# Patient Record
Sex: Female | Born: 1950 | Race: White | Hispanic: No | Smoking: Never smoker
Health system: Southern US, Community
[De-identification: ages and names within clinical notes are randomized; demographics above are authoritative.]

## PROBLEM LIST (undated history)

## (undated) DIAGNOSIS — F32A Depression, unspecified: Secondary | ICD-10-CM

## (undated) DIAGNOSIS — Z8489 Family history of other specified conditions: Secondary | ICD-10-CM

## (undated) DIAGNOSIS — J189 Pneumonia, unspecified organism: Secondary | ICD-10-CM

## (undated) DIAGNOSIS — C801 Malignant (primary) neoplasm, unspecified: Secondary | ICD-10-CM

## (undated) DIAGNOSIS — I10 Essential (primary) hypertension: Secondary | ICD-10-CM

## (undated) DIAGNOSIS — S92819A Other fracture of unspecified foot, initial encounter for closed fracture: Secondary | ICD-10-CM

## (undated) DIAGNOSIS — I499 Cardiac arrhythmia, unspecified: Secondary | ICD-10-CM

## (undated) DIAGNOSIS — E785 Hyperlipidemia, unspecified: Secondary | ICD-10-CM

## (undated) DIAGNOSIS — M1990 Unspecified osteoarthritis, unspecified site: Secondary | ICD-10-CM

## (undated) DIAGNOSIS — I493 Ventricular premature depolarization: Secondary | ICD-10-CM

## (undated) DIAGNOSIS — I358 Other nonrheumatic aortic valve disorders: Secondary | ICD-10-CM

## (undated) DIAGNOSIS — S99921A Unspecified injury of right foot, initial encounter: Secondary | ICD-10-CM

## (undated) DIAGNOSIS — R9431 Abnormal electrocardiogram [ECG] [EKG]: Secondary | ICD-10-CM

## (undated) DIAGNOSIS — F329 Major depressive disorder, single episode, unspecified: Secondary | ICD-10-CM

## (undated) DIAGNOSIS — Z8249 Family history of ischemic heart disease and other diseases of the circulatory system: Secondary | ICD-10-CM

## (undated) HISTORY — DX: Family history of ischemic heart disease and other diseases of the circulatory system: Z82.49

## (undated) HISTORY — DX: Other nonrheumatic aortic valve disorders: I35.8

## (undated) HISTORY — DX: Unspecified osteoarthritis, unspecified site: M19.90

## (undated) HISTORY — PX: URETERAL STENT PLACEMENT: SHX822

## (undated) HISTORY — DX: Abnormal electrocardiogram (ECG) (EKG): R94.31

## (undated) HISTORY — PX: JOINT REPLACEMENT: SHX530

## (undated) HISTORY — DX: Essential (primary) hypertension: I10

## (undated) HISTORY — DX: Hyperlipidemia, unspecified: E78.5

---

## 2013-03-29 ENCOUNTER — Other Ambulatory Visit (HOSPITAL_COMMUNITY)
Admission: RE | Admit: 2013-03-29 | Discharge: 2013-03-29 | Disposition: A | Discharge status: Home / Self Care | Payer: BC Managed Care – PPO | Source: Ambulatory Visit | Attending: Family Medicine | Admitting: Family Medicine

## 2013-03-29 DIAGNOSIS — Z124 Encounter for screening for malignant neoplasm of cervix: Secondary | ICD-10-CM | POA: Insufficient documentation

## 2013-03-29 DIAGNOSIS — Z1151 Encounter for screening for human papillomavirus (HPV): Secondary | ICD-10-CM | POA: Insufficient documentation

## 2013-04-26 ENCOUNTER — Other Ambulatory Visit: Payer: Self-pay

## 2013-04-26 DIAGNOSIS — Z1231 Encounter for screening mammogram for malignant neoplasm of breast: Secondary | ICD-10-CM

## 2013-06-15 ENCOUNTER — Ambulatory Visit
Admission: RE | Admit: 2013-06-15 | Discharge: 2013-06-15 | Disposition: A | Discharge status: Home / Self Care | Payer: Self-pay | Source: Ambulatory Visit

## 2013-06-15 DIAGNOSIS — Z1231 Encounter for screening mammogram for malignant neoplasm of breast: Secondary | ICD-10-CM

## 2013-07-15 ENCOUNTER — Ambulatory Visit: Payer: BC Managed Care – PPO | Admitting: Internal Medicine

## 2013-07-20 ENCOUNTER — Ambulatory Visit (INDEPENDENT_AMBULATORY_CARE_PROVIDER_SITE_OTHER): Payer: BC Managed Care – PPO | Admitting: Cardiology

## 2013-07-20 ENCOUNTER — Encounter: Payer: Self-pay | Admitting: Cardiology

## 2013-07-20 VITALS — BP 130/90 | HR 85 | Ht 66.0 in | Wt 223.1 lb

## 2013-07-20 DIAGNOSIS — I4949 Other premature depolarization: Secondary | ICD-10-CM

## 2013-07-20 DIAGNOSIS — I493 Ventricular premature depolarization: Secondary | ICD-10-CM | POA: Insufficient documentation

## 2013-07-20 DIAGNOSIS — E669 Obesity, unspecified: Secondary | ICD-10-CM | POA: Insufficient documentation

## 2013-07-20 DIAGNOSIS — I10 Essential (primary) hypertension: Secondary | ICD-10-CM | POA: Insufficient documentation

## 2013-07-20 DIAGNOSIS — Z01810 Encounter for preprocedural cardiovascular examination: Secondary | ICD-10-CM | POA: Insufficient documentation

## 2013-07-20 DIAGNOSIS — E782 Mixed hyperlipidemia: Secondary | ICD-10-CM | POA: Insufficient documentation

## 2013-07-20 NOTE — Assessment & Plan Note (Signed)
Currently on statin. Monitored by PCP.

## 2013-07-20 NOTE — Progress Notes (Signed)
PATIENT: Alicia Schroeder MRN: [id number] DOB: 08-06-1950 PCP: Lilian Coma, MD  Clinic Note: Chief Complaint  Patient presents with  . Surgical Clearance    Hip replacement on 09/29/2013 by Dr Wynelle Link. Dr Stephanie Acre referred her for "little pre beats" during EKG. C/o occas chest tightness.    HPI: Alicia Schroeder is a 63 y.o. female with a PMH below who presents today for preoperative risk assessment for hip surgery. She has a long-standing history of hypertension and hyperlipidemia, but has no documented cardiac conditions. She is relatively significant family history of CAD in her father (initial onset of disease was in his 31s, he had carotid endarterectomy as well as 3 vessel CABG plus bowel replacement. He eventually developed ischemic cardiomyopathy).  Interval History: Alicia Schroeder presents today for preoperative cardiovascular evaluation. The main reason for referral is an abnormal ECG which demonstrates essentially normal rhythm with normal QRS complexes but frequent PVCs in almost a trigeminy pattern. 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 She denies any sensation of palpitations or regular heartbeat associated with it. She has become very much inactive since roughly November of last year when her pain in her hips became worse. To do much the way of any significant activity to know if she's had any discomfort in her chest with rest or exertion. She has noted rare episodes of tightness in her chest have not been associated any particular activity.  Otherwise from a cardiac standpoint she's been relatively asymptomatic with no resting or exertional dyspnea beyond the huffing & puffing with walking. No PND, orthopnea or edema. No palpitations, lightheadedness, dizziness, weakness or syncope/near syncope. No TIA/amaurosis fugax symptoms. No melena, hematochezia hematuria.   Past Medical History  Diagnosis Date  . Hyperlipidemia   . Hypertension   . Arthritis      right greater the left hip  . Abnormal  ECG   . Family history of premature coronary artery disease     Father with extensive coronary and carotid disease    Prior Cardiac Evaluation and Past Surgical History: History reviewed. No pertinent past surgical history.  No Known Allergies  Current Outpatient Prescriptions  Medication Sig Dispense Refill  . aspirin EC 81 MG tablet Take 81 mg by mouth daily.      Marland Kitchen atorvastatin (LIPITOR) 20 MG tablet Take 20 mg by mouth daily.      . calcium carbonate (OS-CAL) 600 MG TABS tablet Take 600 mg by mouth daily.      . celecoxib (CELEBREX) 200 MG capsule Take 200 mg by mouth daily.      . cetirizine (ZYRTEC) 10 MG tablet Take 10 mg by mouth daily.      . Cholecalciferol (VITAMIN D3) 2000 UNITS TABS Take 1 tablet by mouth daily.      . Coenzyme Q10 50 MG CAPS Take 1 capsule by mouth daily.      Marland Kitchen lisinopril (PRINIVIL,ZESTRIL) 20 MG tablet Take 20 mg by mouth daily.      . Melatonin (MELATONIN FAST MELTZ) 500 MCG TBDP Take 1 tablet by mouth at bedtime as needed.      . Naproxen Sodium (ALEVE PO) Take by mouth daily.      Marland Kitchen OVER THE COUNTER MEDICATION Astragulus Tincture twice daily in the winter.       No current facility-administered medications for this visit.    History   Social History Narrative   She is a divorced mother of 2. She's been divorced for 10 years. She does long with her dog. 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    She's never smoked. Does not drink alcohol.    she does daily exercises for her hips 7 nasal week for 20 minutes at a time. This is mostly stretching and. Because of her hip pain she is no longer able to walk like she used to.    Phone [phone]   Children: Jasa Bucek [place], [place] - [phone]); Drianna Stolar [place], [place] [phone] since about.     family history includes Arrhythmia in her maternal grandfather; Heart disease in her paternal grandmother; Heart disease (age of onset: 82) in her father; Heart  failure in her mother; Hyperlipidemia in her father; Hypertension in her father, mother, and paternal grandfather; Kidney failure in her father; Parkinson's disease in her mother and paternal grandfather; Stroke in her maternal grandmother; Thyroid disease in her mother; Transient ischemic attack in her maternal grandmother.  ROS: A comprehensive Review of Systems - Negative except Significant bilateral hip and back pain worse in the right side. Otherwise essentially negative. She feels tired and lazy because she is not be able to do much. Takes melatonin to help her sleep.  PHYSICAL EXAM BP 130/90  Pulse 85  Ht 5\' 6"  (1.676 m)  Wt 223 lb 1.6 oz (101.197 kg)  BMI 36.03 kg/m2 General appearance: alert, cooperative, appears stated age, no distress, moderately obese and Well-nourished and well-groomed. Pleasant mood and affect. Answers questions appropriately HEENT: [place]/AT, EOMI, MMM, anicteric sclera Neck: no adenopathy, no carotid bruit, no JVD, supple, symmetrical, trachea midline and thyroid not enlarged, symmetric, no tenderness/mass/nodules Lungs: clear to auscultation bilaterally, normal percussion bilaterally and Nonlabored, and good air movement Heart: RRR with frequent ectopy. Normal S1-S2. Soft systolic murmur heard at the right upper sternal border (1/6). No R./G. Nondisplaced PMI. Abdomen: soft, non-tender; bowel sounds normal; no masses,  no organomegaly and mild truncal obesity Extremities: extremities normal, atraumatic, no cyanosis or edema and no ulcers, gangrene or trophic changes Pulses: 2+ and symmetric Skin: Skin color, texture, turgor normal. No rashes or lesions Neurologic: Alert and oriented X 3, normal strength and tone. Normal symmetric reflexes. Normal coordination with antalgic gait  [id number] today: Yes Rate: 85 , Rhythm: NSR with frequent PVCs in almost trigeminy pattern. Otherwise normal ECG.  Recent Labs: None available  ASSESSMENT / PLAN: 63 year old woman  with right risk factors including hypertension, hyperlipidemia, obesity and family history of premature CAD. She has an abnormal ECG with monomorphic PVCs. She is here for cardiovascular risk assessment for upcoming knee surgery which has left her essentially incapacitated and unable to walk. Therefore we're unable to assess her exercise tolerance.  Preoperative cardiovascular examination Based on her risk factors, and abnormality noted on ECG this warrants further evaluation. When unable to tell if she has active ischemic symptoms based on her lack of activity. Plan:   LexiScan Myoview--patient unable to walk, need to assess for possible ischemic etiology for monomorphic PVCs.   PVC's (premature ventricular contractions) Family history and risk factors noted above, concern for possible ischemic etiology versus structural abnormality.  Plan:  2-D echocardiogram  LexiScan Myoview  HTN (hypertension) Well-controlled on current regimen.  Mixed hyperlipidemia Currently on statin. Monitored by PCP.  Obesity (BMI 30-39.9) Discussed importance of dietary modification. Hopefully when her hip is repaired she will be able to get back to walking and other exercises.   Orders Placed This Encounter  Procedures  . Myocardial Perfusion Imaging    Standing Status: Future  Number of Occurrences:      Standing Expiration Date: 07/20/2014    Order Specific Question:  Where should this test be performed    Answer:  [facility]    Order Specific Question:  Type of stress    Answer:  Lexiscan    Order Specific Question:  Patient weight in lbs    Answer:  223  . EKG 12-Lead  . 2D Echocardiogram without contrast    Pre -op clearance, pvc's, htn,    Standing Status: Future     Number of Occurrences:      Standing Expiration Date: 07/20/2014    Order Specific Question:  Type of Echo    Answer:  Complete    Order Specific Question:  Where should this test be performed    Answer:  [facility]    Order Specific Question:  Reason for exam-Echo    Answer:  Abnormal Heart Sounds Nec  785.3    Order Specific Question:  Reason for exam-Echo    Answer:  Other - See Comments Section   Followup: 1 months  Danella Philson W. Ellyn Hack, M.D., M.S. Interventional Cardiolgy [facility]

## 2013-07-20 NOTE — Assessment & Plan Note (Signed)
Well-controlled on current regimen. ?

## 2013-07-20 NOTE — Assessment & Plan Note (Signed)
Discussed importance of dietary modification. Hopefully when her hip is repaired she will be able to get back to walking and other exercises.

## 2013-07-20 NOTE — Assessment & Plan Note (Signed)
Based on her risk factors, and abnormality noted on ECG this warrants further evaluation. When unable to tell if she has active ischemic symptoms based on her lack of activity. Plan:   LexiScan Myoview--patient unable to walk, need to assess for possible ischemic etiology for monomorphic PVCs.

## 2013-07-20 NOTE — Assessment & Plan Note (Signed)
Family history and risk factors noted above, concern for possible ischemic etiology versus structural abnormality.  Plan:  2-D echocardiogram  LexiScan Myoview

## 2013-07-20 NOTE — Patient Instructions (Signed)
Your physician has requested that you have a lexiscan myoview. For further information please visit [email or web address]. Please follow instruction sheet, as given.  Your physician has requested that you have an echocardiogram. Echocardiography is a painless test that uses sound waves to create images of your heart. It provides your doctor with information about the size and shape of your heart and how well your heart's chambers and valves are working. This procedure takes approximately one hour. There are no restrictions for this procedure.  Your physician wants you to follow-up  After test are completed Dr Ellyn Hack.  You will receive a reminder letter in the mail two months in advance. If you don't receive a letter, please call our office to schedule the follow-up appointment.

## 2013-07-29 ENCOUNTER — Telehealth (HOSPITAL_COMMUNITY): Payer: Self-pay

## 2013-08-03 ENCOUNTER — Ambulatory Visit (HOSPITAL_COMMUNITY)
Admission: RE | Admit: 2013-08-03 | Discharge: 2013-08-03 | Disposition: A | Discharge status: Home / Self Care | Payer: BC Managed Care – PPO | Source: Ambulatory Visit | Attending: Cardiovascular Disease | Admitting: Cardiovascular Disease

## 2013-08-03 DIAGNOSIS — E782 Mixed hyperlipidemia: Secondary | ICD-10-CM | POA: Insufficient documentation

## 2013-08-03 DIAGNOSIS — Z01810 Encounter for preprocedural cardiovascular examination: Secondary | ICD-10-CM | POA: Insufficient documentation

## 2013-08-03 DIAGNOSIS — I10 Essential (primary) hypertension: Secondary | ICD-10-CM | POA: Insufficient documentation

## 2013-08-03 DIAGNOSIS — I4949 Other premature depolarization: Secondary | ICD-10-CM | POA: Insufficient documentation

## 2013-08-03 DIAGNOSIS — I493 Ventricular premature depolarization: Secondary | ICD-10-CM

## 2013-08-03 HISTORY — PX: OTHER SURGICAL HISTORY: SHX169

## 2013-08-03 MED ORDER — REGADENOSON 0.4 MG/5ML IV SOLN
0.4000 mg | Freq: Once | INTRAVENOUS | Status: AC
  Administered 2013-08-03: 0.4 mg via INTRAVENOUS

## 2013-08-03 MED ORDER — TECHNETIUM TC 99M SESTAMIBI GENERIC - CARDIOLITE
10.5000 | Freq: Once | INTRAVENOUS | Status: AC | PRN
  Administered 2013-08-03: 11 via INTRAVENOUS

## 2013-08-03 MED ORDER — TECHNETIUM TC 99M SESTAMIBI GENERIC - CARDIOLITE
29.9000 | Freq: Once | INTRAVENOUS | Status: AC | PRN
  Administered 2013-08-03: 29.9 via INTRAVENOUS

## 2013-08-03 NOTE — Procedures (Addendum)
[facility] [address] [place] [address] [phone]  Cardiology Nuclear Med Study  Alicia Schroeder is a 63 y.o. female     MRN : [id number]     DOB: [birth year]  Procedure Date: 08/03/2013  Nuclear Med Background Indication for Stress Test:  Evaluation for Ischemia, Surgical Clearance and Abnormal EKG History:  No prior cardiac or respiratory history reported by patient. Cardiac Risk Factors: Family History - CAD, Hypertension, Lipids and Obesity  Symptoms:  Palpitations   Nuclear Pre-Procedure Caffeine/Decaff Intake:  1:00am NPO After: 11am   IV Site: R Forearm  IV 0.9% NS with Angio Cath:  22g  Chest Size (in):  n/a IV Started by: Azucena Cecil, RN  Height: 5\' 6"  (1.676 m)  Cup Size: F  BMI:  Body mass index is 36.01 kg/(m^2). Weight:  223 lb (101.152 kg)   Tech Comments:  n/a    Nuclear Med Study 1 or 2 day study: 1 day  Stress Test Type:  Superior Provider:  Glenetta Hew, MD   Resting Radionuclide: Technetium 71m Sestamibi  Resting Radionuclide Dose: 10.5 mCi   Stress Radionuclide:  Technetium 32m Sestamibi  Stress Radionuclide Dose: 29.9 mCi           Stress Protocol Rest HR: 82 Stress HR: 112  Rest BP: 161/108 Stress BP: 149/83  Exercise Time (min): n/a METS: n/a   Predicted Max HR: 158 bpm % Max HR: 70.89 bpm Rate Pressure Product: 18032  Dose of Adenosine (mg):  n/a Dose of Lexiscan: 0.4 mg  Dose of Atropine (mg): n/a Dose of Dobutamine: n/a mcg/kg/min (at max HR)  Stress Test Technologist: Leane Para, CCT Nuclear Technologist: Imagene Riches, CNMT   Rest Procedure:  Myocardial perfusion imaging was performed at rest 45 minutes following the intravenous administration of Technetium 35m Sestamibi. Stress Procedure:  The patient received IV Lexiscan 0.4 mg over 15-seconds.  Technetium 26m Sestamibi injected at 30-seconds.  There were no significant changes with Lexiscan.   Quantitative spect images were obtained after a 45 minute delay.  Transient Ischemic Dilatation (Normal <1.22):  0.97 Lung/Heart Ratio (Normal <0.45):  0.24 QGS EDV:  73 ml QGS ESV:  21 ml LV Ejection Fraction: 71%     Rest ECG: NSR with PVCs  Stress ECG: No significant change from baseline ECG  QPS Raw Data Images:  Normal; no motion artifact; normal heart/lung ratio. Stress Images:  Normal homogeneous uptake in all areas of the myocardium. Rest Images:  Normal homogeneous uptake in all areas of the myocardium. Subtraction (SDS):  No evidence of ischemia.  Impression Exercise Capacity:  Lexiscan with no exercise. BP Response:  Normal blood pressure response. Clinical Symptoms:  No significant symptoms noted. ECG Impression:  No significant ST segment change suggestive of ischemia. Comparison with Prior Nuclear Study: No images to compare  Overall Impression:  Normal stress nuclear study.  LV Wall Motion:  NL LV Function; NL Wall Motion   Lorretta Harp, MD  08/03/2013 5:14 PM

## 2013-08-04 ENCOUNTER — Telehealth: Payer: Self-pay | Admitting: *Deleted

## 2013-08-04 NOTE — Progress Notes (Signed)
Quick Note:  Stress Test looked good!! No sign of significant Heart Artery Disease. Pump function is normal.  Good news!!. Will await the results of the echocardiogram, but expect this to be normal.  With these results, there'll be no reason not to proceed with planned surgery.  Leonie Man, MD  ______

## 2013-08-04 NOTE — Telephone Encounter (Signed)
Left message on voice mail to call back in regards to [facility] results

## 2013-08-04 NOTE — Telephone Encounter (Signed)
Spoke to patient. Result given . Verbalized understanding  

## 2013-08-04 NOTE — Telephone Encounter (Signed)
Message copied by Raiford Simmonds on Wed Aug 04, 2013 10:19 AM ------      Message from: Ellyn Hack, DAVID W      Created: Wed Aug 04, 2013  2:38 AM       Stress Test looked good!! No sign of significant Heart Artery Disease.  Pump function is normal.            Good news!!. Will await the results of the echocardiogram, but expect this to be normal.            With these results, there'll be no reason not to proceed with planned surgery.            Leonie Man, MD       ------

## 2013-08-05 ENCOUNTER — Ambulatory Visit (HOSPITAL_COMMUNITY)
Admission: RE | Admit: 2013-08-05 | Discharge: 2013-08-05 | Disposition: A | Discharge status: Home / Self Care | Payer: BC Managed Care – PPO | Source: Ambulatory Visit | Attending: Cardiovascular Disease | Admitting: Cardiovascular Disease

## 2013-08-05 DIAGNOSIS — E782 Mixed hyperlipidemia: Secondary | ICD-10-CM

## 2013-08-05 DIAGNOSIS — I10 Essential (primary) hypertension: Secondary | ICD-10-CM | POA: Insufficient documentation

## 2013-08-05 DIAGNOSIS — I517 Cardiomegaly: Secondary | ICD-10-CM

## 2013-08-05 DIAGNOSIS — I493 Ventricular premature depolarization: Secondary | ICD-10-CM

## 2013-08-05 DIAGNOSIS — I4949 Other premature depolarization: Secondary | ICD-10-CM | POA: Insufficient documentation

## 2013-08-05 HISTORY — PX: TRANSTHORACIC ECHOCARDIOGRAM: SHX275

## 2013-08-05 NOTE — Progress Notes (Signed)
2D Echo Performed 08/05/2013    Alicia Schroeder, RCS

## 2013-08-06 NOTE — Progress Notes (Signed)
Quick Note:  Echo results: Good news: Essentially normal echocardiogram and normal pump function and normal valve function.  EF: 60-65%. Abnormal relaxation.  No regional wall motion abnormalities -- No signs to suggest heart attack.. No findings to suggest etiology of abnormal heart exam findings.  Leonie Man, MD    ______

## 2013-08-09 ENCOUNTER — Telehealth: Payer: Self-pay | Admitting: *Deleted

## 2013-08-09 NOTE — Telephone Encounter (Signed)
Spoke to patient.  ECHO Result given . Verbalized understanding  

## 2013-08-09 NOTE — Telephone Encounter (Signed)
Message copied by Raiford Simmonds on Mon Aug 09, 2013  8:15 AM ------      Message from: Leonie Man      Created: Fri Aug 06, 2013 12:44 AM       Echo results:      Good news: Essentially normal echocardiogram and normal pump function and normal valve function.        EF: 60-65%.  Abnormal relaxation.            No regional wall motion abnormalities  -- No signs to suggest heart attack..      No findings to suggest etiology of abnormal heart exam findings.            Leonie Man, MD                   ------

## 2013-08-30 ENCOUNTER — Encounter: Payer: Self-pay | Admitting: Cardiology

## 2013-08-30 ENCOUNTER — Ambulatory Visit (INDEPENDENT_AMBULATORY_CARE_PROVIDER_SITE_OTHER): Payer: BC Managed Care – PPO | Admitting: Cardiology

## 2013-08-30 VITALS — BP 140/80 | HR 92 | Ht 65.75 in | Wt 224.4 lb

## 2013-08-30 DIAGNOSIS — Z01810 Encounter for preprocedural cardiovascular examination: Secondary | ICD-10-CM

## 2013-08-30 DIAGNOSIS — I4949 Other premature depolarization: Secondary | ICD-10-CM

## 2013-08-30 DIAGNOSIS — I493 Ventricular premature depolarization: Secondary | ICD-10-CM

## 2013-08-30 DIAGNOSIS — E782 Mixed hyperlipidemia: Secondary | ICD-10-CM

## 2013-08-30 DIAGNOSIS — E669 Obesity, unspecified: Secondary | ICD-10-CM

## 2013-08-30 MED ORDER — ACEBUTOLOL HCL 200 MG PO CAPS: 200.0000 mg | ORAL_CAPSULE | Freq: Two times a day (BID) | ORAL | Status: DC

## 2013-08-30 NOTE — Assessment & Plan Note (Signed)
On Statin. Monitored by PCP.

## 2013-08-30 NOTE — Assessment & Plan Note (Signed)
Based upon ST & Echo results:  Low Risk Patent for at least Intermediate Risk Surgery.  Continue current medications with the addition of low dose Acebutolol.

## 2013-08-30 NOTE — Assessment & Plan Note (Signed)
Encouraged dietary modification & increased exercise post-op.

## 2013-08-30 NOTE — Assessment & Plan Note (Signed)
Evaluated with Echo & Myoivew.  No gross abnormalities -- structural or ischemic.  Start Acebutolol 200 mg po BID.

## 2013-08-30 NOTE — Patient Instructions (Addendum)
Your physician has recommended you make the following change in your medication..  1. START acebutolol - take 1 capsule once daily for 6 days, then increase to 1 capsule twice daily.   Your physician wants you to follow-up in: 6 months. You will receive a reminder letter in the mail two months in advance. If you don't receive a letter, please call our office to schedule the follow-up appointment.

## 2013-08-30 NOTE — Progress Notes (Signed)
PATIENT: Alicia Schroeder MRN: [id number] DOB: 1951/03/10 PCP: Lilian Coma, MD  Clinic Note: Chief Complaint  Patient presents with  . Follow-up    result echo ,myoview, no chest pain , no sob , no edema, need CLEARANCE HIP SUERGERY -DR Wynelle Link   HPI: Alicia Schroeder is a 63 y.o. female with a PMH below who presents today for followup preoperative risk assessment for hip surgery. She has a long-standing history of hypertension and hyperlipidemia, but has no documented cardiac conditions. She does have a relatively significant family history of CAD in her father (initial onset of disease was in his 38s, he had carotid endarterectomy as well as 3 vessel CABG plus bowel replacement. He eventually developed ischemic cardiomyopathy). I saw her earlier last month for preoperative risk assessment based on her risk factors and an EKG showing essentially monomorphic PVCs in trigeminy. She has been evaluated now with an echocardiogram and Myoview with results noted below. Both results are "relatively normal".  Interval History: Oralee continues to be doing fine overall from a cardiac standpoint, albeit relatively inactive to evaluate. She denies any significant palpitations or irregular heartbeats; however during last visit, she did note rare episodes of tightness in her chest have not been associated any particular activity.  Otherwise from a cardiac standpoint she's been relatively asymptomatic with no resting or exertional dyspnea beyond the huffing & puffing with walking. No chest pain with rest or exertion No PND, orthopnea or edema. No palpitations, lightheadedness, dizziness, weakness or syncope/near syncope. No TIA/amaurosis fugax symptoms. No melena, hematochezia, hematuria.  Kneeling notable thing that happened since I last saw her was that she had an incident where fell down her front steps. She was struggling with her dog, that was trying to run out the front door to get into an  altercation. She was trying to fight the dog and her legs caught up in the leash.  She tripped and fell down steps, but did not suffer any significant injury.  Thankfully she actually fell on the opposite hip. No claudication  Past Medical History  Diagnosis Date  . Hyperlipidemia   . Hypertension   . Arthritis      right greater the left hip  . Abnormal ECG   . Family history of premature coronary artery disease     Father with extensive coronary and carotid disease    Prior Cardiac Evaluation and Past Surgical History: Past Surgical History  Procedure Laterality Date  . Transthoracic echocardiogram  08/05/2013    Normal LV size and function. EF 60-65%. No regional WMA; grade 1 diastolic dysfunction with mild LA dilation; no significant valvular lesions -- mild aortic valve calcification/sclerosis (could explain a soft systolic murmur)  . Nm lexiscan myoview ltd  08/03/2013    Normal study. No ischemia or infarction. Normal wall motion. EF 71%   No Known Allergies  MEDICATION REVIEWED IN EPIC -- no changes SOCIAL & FAMILY HISTORIES REVIEWED IN EPIC - No change.  ROS: A comprehensive Review of Systems - Negative except Significant bilateral hip and back pain worse in the right side & recent fall related bruising & aches/pains. Otherwise essentially negative. She feels tired and lazy because she is not be able to do much. Takes melatonin to help her sleep.  PHYSICAL EXAM BP 140/80  Pulse 92  Ht 5' 5.75" (1.67 m)  Wt 224 lb 6.4 oz (101.787 kg)  BMI 36.50 kg/m2 General appearance: alert, cooperative, appears stated age, no distress, moderately obese and Well-nourished and well-groomed.  Pleasant mood and affect. Answers questions appropriately HEENT: [place]/AT, EOMI, MMM, anicteric sclera Neck: Supple, no adenopathy, carotid bruit, or  JVD, Lungs:CTAB, normal percussion bilaterally and Nonlabored, and good air movement Heart: RRR with frequent ectopy. Normal S1-S2. Soft 1/6 SEM @ RUSB;  no other R/G. Nondisplaced PMI. Abdomen: soft,NT/ND/NABS; no masses,  no organomegaly and mild truncal obesity Extremities: No C/C/E; no rash or lesions.; Pulses: 2+ and symmetric Neurologic: Alert and oriented X 3, normal strength and tone.   LKT:[id number] today: No   Recent Labs: None available  ASSESSMENT / PLAN: 63 year old woman with right risk factors including hypertension, hyperlipidemia, obesity and family history of premature CAD. She has an abnormal ECG with monomorphic PVCs. She is here for cardiovascular risk assessment for upcoming surgery.  Cardiac Risk assessment included an Echocardiogram & Myoview that were relatively normal.   She does have HTN that is not quite adequately controlled & frequent PVCs on ECG.  I will start low dose BB to reduce PVC frequency & for cardiac protection per-operatively.  Using Acebutolol to avoid side effect of reduced energy (to avoid post-op fatigue exacerbation).  She is very concerned about medications & side effects.    PVC's (premature ventricular contractions) Evaluated with Echo & Myoivew.  No gross abnormalities -- structural or ischemic.  Start Acebutolol 200 mg po BID.  Obesity (BMI 30-39.9) Encouraged dietary modification & increased exercise post-op.  Mixed hyperlipidemia On Statin. Monitored by PCP.  Preoperative cardiovascular examination Based upon ST & Echo results:  Low Risk Patent for at least Intermediate Risk Surgery.  Continue current medications with the addition of low dose Acebutolol.   No orders of the defined types were placed in this encounter.   Followup: 6 months  Dariann Huckaba W. Ellyn Hack, M.D., M.S. Interventional Cardiolgy [facility]

## 2013-09-15 NOTE — Progress Notes (Signed)
Surgery on 09/29/13.  Preop on 09/22/13 at 100pm.  Need orders in EPIC.  Thank You.  

## 2013-09-17 ENCOUNTER — Encounter (HOSPITAL_COMMUNITY): Payer: Self-pay | Admitting: Pharmacy Technician

## 2013-09-17 NOTE — Progress Notes (Signed)
Surgery on 09/29/13.  Preop on 09/22/13 at 100pm.  Need orders in EPIC.  Thank You.

## 2013-09-18 ENCOUNTER — Other Ambulatory Visit: Payer: Self-pay | Admitting: Orthopedic Surgery

## 2013-09-21 ENCOUNTER — Other Ambulatory Visit: Payer: Self-pay | Admitting: Orthopedic Surgery

## 2013-09-21 NOTE — Patient Instructions (Signed)
Alicia Schroeder  09/21/2013   Your procedure is scheduled on:  09/29/2013 0830am-1015am  Report to [facility] at     0600 AM.  Call this number if you have problems the morning of surgery: [phone]   Remember:   Do not eat food or drink liquids after midnight.   Take these medicines the morning of surgery with A SIP OF WATER:    Do not wear jewelry, make-up or nail polish.  Do not wear lotions, powders, or perfumes.   Do not shave 48 hours prior to surgery.   Do not bring valuables to the hospital.  Contacts, dentures or bridgework may not be worn into surgery.  Leave suitcase in the car. After surgery it may be brought to your room.  For patients admitted to the hospital, checkout time is 11:00 AM the day of  discharge.   [facility] - Preparing for Surgery Before surgery, you can play an important role.  Because skin is not sterile, your skin needs to be as free of germs as possible.  You can reduce the number of germs on your skin by washing with CHG (chlorahexidine gluconate) soap before surgery.  CHG is an antiseptic cleaner which kills germs and bonds with the skin to continue killing germs even after washing. Please DO NOT use if you have an allergy to CHG or antibacterial soaps.  If your skin becomes reddened/irritated stop using the CHG and inform your nurse when you arrive at Short Stay. Do not shave (including legs and underarms) for at least 48 hours prior to the first CHG shower.  You may shave your face. Please follow these instructions carefully:  1.  Shower with CHG Soap the night before surgery and the  morning of Surgery.  2.  If you choose to wash your hair, wash your hair first as usual with your  normal  shampoo.  3.  After you shampoo, rinse your hair and body thoroughly to remove the  shampoo.                           4.  Use CHG as you would any other liquid soap.  You can apply chg directly  to the skin and wash                       Gently with a  scrungie or clean washcloth.  5.  Apply the CHG Soap to your body ONLY FROM THE NECK DOWN.   Do not use on open                           Wound or open sores. Avoid contact with eyes, ears mouth and genitals (private parts).                        Genitals (private parts) with your normal soap.             6.  Wash thoroughly, paying special attention to the area where your surgery  will be performed.  7.  Thoroughly rinse your body with warm water from the neck down.  8.  DO NOT shower/wash with your normal soap after using and rinsing off  the CHG Soap.                9.  Pat yourself dry with a clean  towel.            10.  Wear clean pajamas.            11.  Place clean sheets on your bed the night of your first shower and do not  sleep with pets. Day of Surgery : Do not apply any lotions/deodorants the morning of surgery.  Please wear clean clothes to the hospital/surgery center.  FAILURE TO FOLLOW THESE INSTRUCTIONS MAY RESULT IN THE CANCELLATION OF YOUR SURGERY PATIENT SIGNATURE_________________________________  NURSE SIGNATURE__________________________________  ________________________________________________________________________   Alicia Schroeder  An incentive spirometer is a tool that can help keep your lungs clear and active. This tool measures how well you are filling your lungs with each breath. Taking long deep breaths may help reverse or decrease the chance of developing breathing (pulmonary) problems (especially infection) following:  A long period of time when you are unable to move or be active. BEFORE THE PROCEDURE   If the spirometer includes an indicator to show your best effort, your nurse or respiratory therapist will set it to a desired goal.  If possible, sit up straight or lean slightly forward. Try not to slouch.  Hold the incentive spirometer in an upright position. INSTRUCTIONS FOR USE  1. Sit on the edge of your bed if possible, or sit up as far  as you can in bed or on a chair. 2. Hold the incentive spirometer in an upright position. 3. Breathe out normally. 4. Place the mouthpiece in your mouth and seal your lips tightly around it. 5. Breathe in slowly and as deeply as possible, raising the piston or the ball toward the top of the column. 6. Hold your breath for 3-5 seconds or for as long as possible. Allow the piston or ball to fall to the bottom of the column. 7. Remove the mouthpiece from your mouth and breathe out normally. 8. Rest for a few seconds and repeat Steps 1 through 7 at least 10 times every 1-2 hours when you are awake. Take your time and take a few normal breaths between deep breaths. 9. The spirometer may include an indicator to show your best effort. Use the indicator as a goal to work toward during each repetition. 10. After each set of 10 deep breaths, practice coughing to be sure your lungs are clear. If you have an incision (the cut made at the time of surgery), support your incision when coughing by placing a pillow or rolled up towels firmly against it. Once you are able to get out of bed, walk around indoors and cough well. You may stop using the incentive spirometer when instructed by your caregiver.  RISKS AND COMPLICATIONS  Take your time so you do not get dizzy or light-headed.  If you are in pain, you may need to take or ask for pain medication before doing incentive spirometry. It is harder to take a deep breath if you are having pain. AFTER USE  Rest and breathe slowly and easily.  It can be helpful to keep track of a log of your progress. Your caregiver can provide you with a simple table to help with this. If you are using the spirometer at home, follow these instructions: [place] IF:   You are having difficultly using the spirometer.  You have trouble using the spirometer as often as instructed.  Your pain medication is not giving enough relief while using the spirometer.  You  develop fever of 100.5 F (38.1 C) or  higher. SEEK IMMEDIATE MEDICAL CARE IF:   You cough up bloody sputum that had not been present before.  You develop fever of 102 F (38.9 C) or greater.  You develop worsening pain at or near the incision site. MAKE SURE YOU:   Understand these instructions.  Will watch your condition.  Will get help right away if you are not doing well or get worse. Document Released: 09/16/2006 Document Revised: 07/29/2011 Document Reviewed: 11/17/2006 ExitCare Patient Information 2014 ExitCare, [place].   ________________________________________________________________________  WHAT IS A BLOOD TRANSFUSION? Blood Transfusion Information  A transfusion is the replacement of blood or some of its parts. Blood is made up of multiple cells which provide different functions.  Red blood cells carry oxygen and are used for blood loss replacement.  White blood cells fight against infection.  Platelets control bleeding.  Plasma helps clot blood.  Other blood products are available for specialized needs, such as hemophilia or other clotting disorders. BEFORE THE TRANSFUSION  Who gives blood for transfusions?   Healthy volunteers who are fully evaluated to make sure their blood is safe. This is blood bank blood. Transfusion therapy is the safest it has ever been in the practice of medicine. Before blood is taken from a donor, a complete history is taken to make sure that person has no history of diseases nor engages in risky social behavior (examples are intravenous drug use or sexual activity with multiple partners). The donor's travel history is screened to minimize risk of transmitting infections, such as malaria. The donated blood is tested for signs of infectious diseases, such as HIV and hepatitis. The blood is then tested to be sure it is compatible with you in order to minimize the chance of a transfusion reaction. If you or a relative donates blood, this is  often done in anticipation of surgery and is not appropriate for emergency situations. It takes many days to process the donated blood. RISKS AND COMPLICATIONS Although transfusion therapy is very safe and saves many lives, the main dangers of transfusion include:   Getting an infectious disease.  Developing a transfusion reaction. This is an allergic reaction to something in the blood you were given. Every precaution is taken to prevent this. The decision to have a blood transfusion has been considered carefully by your caregiver before blood is given. Blood is not given unless the benefits outweigh the risks. AFTER THE TRANSFUSION  Right after receiving a blood transfusion, you will usually feel much better and more energetic. This is especially true if your red blood cells have gotten low (anemic). The transfusion raises the level of the red blood cells which carry oxygen, and this usually causes an energy increase.  The nurse administering the transfusion will monitor you carefully for complications. HOME CARE INSTRUCTIONS  No special instructions are needed after a transfusion. You may find your energy is better. Speak with your caregiver about any limitations on activity for underlying diseases you may have. SEEK MEDICAL CARE IF:   Your condition is not improving after your transfusion.  You develop redness or irritation at the intravenous (IV) site. SEEK IMMEDIATE MEDICAL CARE IF:  Any of the following symptoms occur over the next 12 hours:  Shaking chills.  You have a temperature by mouth above 102 F (38.9 C), not controlled by medicine.  Chest, back, or muscle pain.  People around you feel you are not acting correctly or are confused.  Shortness of breath or difficulty breathing.  Dizziness and fainting.  You get a rash or develop hives.  You have a decrease in urine output.  Your urine turns a dark color or changes to pink, red, or brown. Any of the following  symptoms occur over the next 10 days:  You have a temperature by mouth above 102 F (38.9 C), not controlled by medicine.  Shortness of breath.  Weakness after normal activity.  The white part of the eye turns yellow (jaundice).  You have a decrease in the amount of urine or are urinating less often.  Your urine turns a dark color or changes to pink, red, or brown. Document Released: 05/03/2000 Document Revised: 07/29/2011 Document Reviewed: 12/21/2007 ExitCare Patient Information 2014 ExitCare, [place].  _______________________________________________________________________   Please read over the following fact sheets that you were given: MRSA Information, coughing and deep breathing exercises, leg exercises

## 2013-09-22 ENCOUNTER — Ambulatory Visit (HOSPITAL_COMMUNITY)
Admission: RE | Admit: 2013-09-22 | Discharge: 2013-09-22 | Disposition: A | Discharge status: Home / Self Care | Payer: BC Managed Care – PPO | Source: Ambulatory Visit | Attending: Orthopedic Surgery | Admitting: Orthopedic Surgery

## 2013-09-22 ENCOUNTER — Encounter (HOSPITAL_COMMUNITY)
Admission: RE | Admit: 2013-09-22 | Discharge: 2013-09-22 | Disposition: A | Discharge status: Home / Self Care | Payer: BC Managed Care – PPO | Source: Ambulatory Visit | Attending: Orthopedic Surgery | Admitting: Orthopedic Surgery

## 2013-09-22 ENCOUNTER — Encounter (HOSPITAL_COMMUNITY): Payer: Self-pay

## 2013-09-22 DIAGNOSIS — M169 Osteoarthritis of hip, unspecified: Secondary | ICD-10-CM | POA: Insufficient documentation

## 2013-09-22 DIAGNOSIS — M1610 Unilateral primary osteoarthritis, unspecified hip: Secondary | ICD-10-CM | POA: Insufficient documentation

## 2013-09-22 DIAGNOSIS — Z01818 Encounter for other preprocedural examination: Secondary | ICD-10-CM | POA: Insufficient documentation

## 2013-09-22 DIAGNOSIS — I10 Essential (primary) hypertension: Secondary | ICD-10-CM | POA: Insufficient documentation

## 2013-09-22 DIAGNOSIS — Z01812 Encounter for preprocedural laboratory examination: Secondary | ICD-10-CM | POA: Insufficient documentation

## 2013-09-22 HISTORY — DX: Malignant (primary) neoplasm, unspecified: C80.1

## 2013-09-22 HISTORY — DX: Cardiac arrhythmia, unspecified: I49.9

## 2013-09-22 HISTORY — DX: Ventricular premature depolarization: I49.3

## 2013-09-22 HISTORY — DX: Family history of other specified conditions: Z84.89

## 2013-09-22 HISTORY — DX: Pneumonia, unspecified organism: J18.9

## 2013-09-22 LAB — URINALYSIS, ROUTINE W REFLEX MICROSCOPIC
Bilirubin Urine: NEGATIVE
GLUCOSE, UA: NEGATIVE mg/dL
HGB URINE DIPSTICK: NEGATIVE
Ketones, ur: NEGATIVE mg/dL
Nitrite: NEGATIVE
PROTEIN: NEGATIVE mg/dL
Specific Gravity, Urine: 1.006 (ref 1.005–1.030)
UROBILINOGEN UA: 0.2 mg/dL (ref 0.0–1.0)
pH: 5.5 (ref 5.0–8.0)

## 2013-09-22 LAB — COMPREHENSIVE METABOLIC PANEL
ALK PHOS: 99 U/L (ref 39–117)
ALT: 15 U/L (ref 0–35)
AST: 16 U/L (ref 0–37)
Albumin: 4.3 g/dL (ref 3.5–5.2)
BUN: 21 mg/dL (ref 6–23)
CO2: 28 meq/L (ref 19–32)
Calcium: 10.7 mg/dL — ABNORMAL HIGH (ref 8.4–10.5)
Chloride: 103 mEq/L (ref 96–112)
Creatinine, Ser: 0.56 mg/dL (ref 0.50–1.10)
Glucose, Bld: 98 mg/dL (ref 70–99)
Potassium: 4.9 mEq/L (ref 3.7–5.3)
SODIUM: 141 meq/L (ref 137–147)
Total Bilirubin: 0.4 mg/dL (ref 0.3–1.2)
Total Protein: 7.2 g/dL (ref 6.0–8.3)

## 2013-09-22 LAB — CBC
HEMATOCRIT: 36.7 % (ref 36.0–46.0)
Hemoglobin: 12.2 g/dL (ref 12.0–15.0)
MCH: 29.8 pg (ref 26.0–34.0)
MCHC: 33.2 g/dL (ref 30.0–36.0)
MCV: 89.5 fL (ref 78.0–100.0)
Platelets: 317 10*3/uL (ref 150–400)
RBC: 4.1 MIL/uL (ref 3.87–5.11)
RDW: 13.5 % (ref 11.5–15.5)
WBC: 7.4 10*3/uL (ref 4.0–10.5)

## 2013-09-22 LAB — SURGICAL PCR SCREEN
MRSA, PCR: INVALID — AB
Staphylococcus aureus: INVALID — AB

## 2013-09-22 LAB — PROTIME-INR
INR: 0.95 (ref 0.00–1.49)
PROTHROMBIN TIME: 12.5 s (ref 11.6–15.2)

## 2013-09-22 LAB — URINE MICROSCOPIC-ADD ON

## 2013-09-22 LAB — APTT: APTT: 37 s (ref 24–37)

## 2013-09-22 NOTE — Progress Notes (Signed)
ECHO- 07/31/13- EPIC  Stress 08/03/13 EPIC  LOV- Dr Ellyn Hack 08/2013 EPIC

## 2013-09-22 NOTE — Progress Notes (Signed)
Urinalysis and micro result faxed via EPIC to Dr Wynelle Link.

## 2013-09-25 LAB — MRSA CULTURE

## 2013-09-28 ENCOUNTER — Other Ambulatory Visit: Payer: Self-pay | Admitting: Orthopedic Surgery

## 2013-09-28 DIAGNOSIS — C4491 Basal cell carcinoma of skin, unspecified: Secondary | ICD-10-CM | POA: Insufficient documentation

## 2013-09-28 DIAGNOSIS — Z780 Asymptomatic menopausal state: Secondary | ICD-10-CM | POA: Insufficient documentation

## 2013-09-28 NOTE — H&P (Signed)
TOTAL HIP ADMISSION H&P  Patient is admitted for left total hip arthroplasty - anterior approach.  Subjective:  Chief Complaint: left hip pain  HPI: Alicia Schroeder, 63 y.o. female, has a history of pain and functional disability in the left hip(s) due to arthritis and patient has failed non-surgical conservative treatments for greater than 12 weeks to include NSAID's and/or analgesics, flexibility and strengthening excercises and activity modification.  Onset of symptoms was gradual starting several years ago with gradually worsening course since that time.The patient noted no past surgery on the left hip(s).  Patient currently rates pain in the left hip at moderate with activity. Patient has worsening of pain with activity and weight bearing and pain that interfers with activities of daily living. Patient has evidence of bone on bone arthritis with flattening of the femoral head by imaging studies. This condition presents safety issues increasing the risk of falls. This patient has had continued pain.  There is no current active infection.  Patient Active Problem List   Diagnosis Date Noted  . Menopause 09/28/2013  . Cancer of the skin, basal cell 09/28/2013  . Obesity (BMI 30-39.9) 07/20/2013  . Preoperative cardiovascular examination 07/20/2013  . PVC's (premature ventricular contractions) 07/20/2013  . HTN (hypertension) 07/20/2013  . Mixed hyperlipidemia 07/20/2013   Past Medical History  Diagnosis Date  . Hyperlipidemia   . Hypertension   . Arthritis      right greater the left hip  . Abnormal ECG   . Family history of premature coronary artery disease     Father with extensive coronary and carotid disease  . Family history of anesthesia complication     sisterin law disabled from anesthesia   . Premature ventricular contractions   . Dysrhythmia     pvc's   . Heart murmur     no evidence per patient   . Pneumonia     15 years ago approximately   . Cancer     basal cell  carcinoma removed- 24 years ago     Past Surgical History  Procedure Laterality Date  . Transthoracic echocardiogram  08/05/2013    Normal LV size and function. EF 60-65%. No regional WMA; grade 1 diastolic dysfunction with mild LA dilation; no significant valvular lesions -- mild aortic valve calcification/sclerosis (could explain a soft systolic murmur)  . Nm lexiscan myoview ltd  08/03/2013    Normal study. No ischemia or infarction. Normal wall motion. EF 71%    Medications: Cetrizine CoQ10 Vit D3 Triple Flex Lisinopril Atorvastatin Aspirin 81 mg Acebutolol Aleve Celebrex  No Known Allergies  History  Substance Use Topics  . Smoking status: Never Smoker   . Smokeless tobacco: Never Used  . Alcohol Use: Yes     Comment: occasional     Family History  Problem Relation Age of Onset  . Parkinson's disease Mother   . Thyroid disease Mother   . Hypertension Mother   . Heart failure Mother   . Hypertension Father   . Hyperlipidemia Father   . Heart disease Father 61    Triple bypass.   . Kidney failure Father   . Stroke Maternal Grandmother   . Transient ischemic attack Maternal Grandmother   . Arrhythmia Maternal Grandfather     Pacemaker  . Heart disease Paternal Grandmother   . Parkinson's disease Paternal Grandfather   . Hypertension Paternal Grandfather      Review of Systems  Constitutional: Negative.   HENT: Negative.   Respiratory: Negative.  Cardiovascular: Negative.   Gastrointestinal: Negative.   Genitourinary: Negative.   Musculoskeletal: Positive for joint pain.  Neurological: Negative.   Psychiatric/Behavioral: Negative.     Objective:  Physical Exam  Constitutional: She is oriented to person, place, and time. She appears well-developed and well-nourished. No distress.  HENT:  Head: Normocephalic and atraumatic.  Eyes: EOM are normal. Pupils are equal, round, and reactive to light.  Neck: Neck supple. Carotid bruit is not present.   Cardiovascular: Normal rate and regular rhythm.   No murmur heard. Respiratory: Breath sounds normal. She has no wheezes. She has no rales.  GI: Soft. Bowel sounds are normal. She exhibits distension (generalized mild).  Musculoskeletal:       Left hip: She exhibits decreased range of motion (Flex 95 degrees, Zero IR, 10 ER, 20 Abduction).  Neurological: She is alert and oriented to person, place, and time.    Vital signs in last 24 hours: BP 154/88 Resp 12  Labs:   Estimated body mass index is 37.28 kg/(m^2) as calculated from the following:   Height as of 09/22/13: 5\' 5"  (1.651 m).   Weight as of 09/22/13: 101.606 kg (224 lb).   Imaging Review Plain radiographs demonstrate severe degenerative joint disease of the left hip(s). The bone quality appears to be good for age and reported activity level.  Assessment/Plan:  End stage arthritis, left hip(s)  The patient history, physical examination, clinical judgement of the provider and imaging studies are consistent with end stage degenerative joint disease of the left hip(s) and total hip arthroplasty - anterior approach is deemed medically necessary. The treatment options including medical management, injection therapy, arthroscopy and arthroplasty were discussed at length. The risks and benefits of total hip arthroplasty were presented and reviewed. The risks due to aseptic loosening, infection, stiffness, dislocation/subluxation,  thromboembolic complications and other imponderables were discussed.  The patient acknowledged the explanation, agreed to proceed with the plan and consent was signed. Patient is being admitted for inpatient treatment for surgery, pain control, PT, OT, prophylactic antibiotics, VTE prophylaxis, progressive ambulation and ADL's and discharge planning.The patient is planning to be discharged to skilled nursing facility, [facility] or [facility] .  The patient will receive TXA.  PCP - Dr. Jonathon Jordan  Arlee Muslim, PA-C

## 2013-09-28 NOTE — Anesthesia Preprocedure Evaluation (Addendum)
Anesthesia Evaluation  Patient identified by MRN, date of birth, ID band Patient awake    Reviewed: Allergy & Precautions, H&P , NPO status , Patient's Chart, lab work & pertinent test results  Airway Mallampati: II TM Distance: >3 FB Neck ROM: Full    Dental no notable dental hx.    Pulmonary neg pulmonary ROS,  breath sounds clear to auscultation  Pulmonary exam normal       Cardiovascular hypertension, Rhythm:Regular Rate:Normal     Neuro/Psych negative neurological ROS  negative psych ROS   GI/Hepatic negative GI ROS, Neg liver ROS,   Endo/Other  negative endocrine ROS  Renal/GU negative Renal ROS  negative genitourinary   Musculoskeletal negative musculoskeletal ROS (+)   Abdominal   Peds negative pediatric ROS (+)  Hematology negative hematology ROS (+)   Anesthesia Other Findings   Reproductive/Obstetrics negative OB ROS                         Anesthesia Physical Anesthesia Plan  ASA: II  Anesthesia Plan: Spinal   Post-op Pain Management:    Induction: Intravenous  Airway Management Planned:   Additional Equipment:   Intra-op Plan:   Post-operative Plan:   Informed Consent: I have reviewed the patients History and Physical, chart, labs and discussed the procedure including the risks, benefits and alternatives for the proposed anesthesia with the patient or authorized representative who has indicated his/her understanding and acceptance.   Dental advisory given  Plan Discussed with: CRNA and Surgeon  Anesthesia Plan Comments:        Anesthesia Quick Evaluation

## 2013-09-29 ENCOUNTER — Inpatient Hospital Stay (HOSPITAL_COMMUNITY)
Admission: RE | Admit: 2013-09-29 | Discharge: 2013-10-01 | DRG: 470 | Disposition: A | Discharge status: Skilled Nursing Facility | Payer: BC Managed Care – PPO | Source: Ambulatory Visit | Attending: Orthopedic Surgery | Admitting: Orthopedic Surgery

## 2013-09-29 ENCOUNTER — Encounter (HOSPITAL_COMMUNITY): Payer: Self-pay | Admitting: *Deleted

## 2013-09-29 ENCOUNTER — Encounter (HOSPITAL_COMMUNITY)
Admission: RE | Disposition: A | Discharge status: Skilled Nursing Facility | Payer: Self-pay | Source: Ambulatory Visit | Attending: Orthopedic Surgery

## 2013-09-29 ENCOUNTER — Inpatient Hospital Stay (HOSPITAL_COMMUNITY): Payer: BC Managed Care – PPO | Admitting: Anesthesiology

## 2013-09-29 ENCOUNTER — Encounter (HOSPITAL_COMMUNITY): Payer: BC Managed Care – PPO | Admitting: Anesthesiology

## 2013-09-29 ENCOUNTER — Inpatient Hospital Stay (HOSPITAL_COMMUNITY): Payer: BC Managed Care – PPO

## 2013-09-29 DIAGNOSIS — Z823 Family history of stroke: Secondary | ICD-10-CM

## 2013-09-29 DIAGNOSIS — E669 Obesity, unspecified: Secondary | ICD-10-CM | POA: Diagnosis present

## 2013-09-29 DIAGNOSIS — D62 Acute posthemorrhagic anemia: Secondary | ICD-10-CM | POA: Diagnosis not present

## 2013-09-29 DIAGNOSIS — Z791 Long term (current) use of non-steroidal anti-inflammatories (NSAID): Secondary | ICD-10-CM

## 2013-09-29 DIAGNOSIS — M169 Osteoarthritis of hip, unspecified: Secondary | ICD-10-CM | POA: Diagnosis present

## 2013-09-29 DIAGNOSIS — E782 Mixed hyperlipidemia: Secondary | ICD-10-CM | POA: Diagnosis present

## 2013-09-29 DIAGNOSIS — M1610 Unilateral primary osteoarthritis, unspecified hip: Principal | ICD-10-CM | POA: Diagnosis present

## 2013-09-29 DIAGNOSIS — R011 Cardiac murmur, unspecified: Secondary | ICD-10-CM | POA: Diagnosis present

## 2013-09-29 DIAGNOSIS — Z7982 Long term (current) use of aspirin: Secondary | ICD-10-CM

## 2013-09-29 DIAGNOSIS — Z8249 Family history of ischemic heart disease and other diseases of the circulatory system: Secondary | ICD-10-CM

## 2013-09-29 DIAGNOSIS — Z79899 Other long term (current) drug therapy: Secondary | ICD-10-CM

## 2013-09-29 DIAGNOSIS — Z96642 Presence of left artificial hip joint: Secondary | ICD-10-CM

## 2013-09-29 DIAGNOSIS — Z6837 Body mass index (BMI) 37.0-37.9, adult: Secondary | ICD-10-CM

## 2013-09-29 DIAGNOSIS — Z780 Asymptomatic menopausal state: Secondary | ICD-10-CM

## 2013-09-29 DIAGNOSIS — I10 Essential (primary) hypertension: Secondary | ICD-10-CM | POA: Diagnosis present

## 2013-09-29 HISTORY — PX: TOTAL HIP ARTHROPLASTY: SHX124

## 2013-09-29 LAB — ABO/RH: ABO/RH(D): A POS

## 2013-09-29 LAB — TYPE AND SCREEN
ABO/RH(D): A POS
Antibody Screen: NEGATIVE

## 2013-09-29 SURGERY — ARTHROPLASTY, HIP, TOTAL, ANTERIOR APPROACH
Anesthesia: Spinal | Site: Hip | Laterality: Left

## 2013-09-29 MED ORDER — SODIUM CHLORIDE 0.9 % IJ SOLN
INTRAMUSCULAR | Status: AC
  Filled 2013-09-29: qty 50

## 2013-09-29 MED ORDER — LACTATED RINGERS IV SOLN: INTRAVENOUS | Status: DC

## 2013-09-29 MED ORDER — ONDANSETRON HCL 4 MG/2ML IJ SOLN
INTRAMUSCULAR | Status: AC
  Filled 2013-09-29: qty 2

## 2013-09-29 MED ORDER — CEFAZOLIN SODIUM-DEXTROSE 2-3 GM-% IV SOLR
2.0000 g | Freq: Four times a day (QID) | INTRAVENOUS | Status: AC
  Administered 2013-09-29 (×2): 2 g via INTRAVENOUS
  Filled 2013-09-29 (×2): qty 50

## 2013-09-29 MED ORDER — METOCLOPRAMIDE HCL 5 MG/ML IJ SOLN: 5.0000 mg | Freq: Three times a day (TID) | INTRAMUSCULAR | Status: DC | PRN

## 2013-09-29 MED ORDER — PHENYLEPHRINE HCL 10 MG/ML IJ SOLN
INTRAMUSCULAR | Status: DC | PRN
  Administered 2013-09-29 (×2): 80 ug via INTRAVENOUS

## 2013-09-29 MED ORDER — TRANEXAMIC ACID 100 MG/ML IV SOLN
1000.0000 mg | INTRAVENOUS | Status: AC
  Administered 2013-09-29: 1000 mg via INTRAVENOUS
  Filled 2013-09-29: qty 10

## 2013-09-29 MED ORDER — EPHEDRINE SULFATE 50 MG/ML IJ SOLN
INTRAMUSCULAR | Status: DC | PRN
  Administered 2013-09-29 (×2): 10 mg via INTRAVENOUS
  Administered 2013-09-29: 5 mg via INTRAVENOUS

## 2013-09-29 MED ORDER — ONDANSETRON HCL 4 MG/2ML IJ SOLN
INTRAMUSCULAR | Status: DC | PRN
  Administered 2013-09-29: 4 mg via INTRAVENOUS

## 2013-09-29 MED ORDER — DEXAMETHASONE 6 MG PO TABS
10.0000 mg | ORAL_TABLET | Freq: Every day | ORAL | Status: AC
  Administered 2013-09-30: 10 mg via ORAL
  Filled 2013-09-29: qty 1

## 2013-09-29 MED ORDER — KETAMINE HCL 10 MG/ML IJ SOLN
INTRAMUSCULAR | Status: DC | PRN
  Administered 2013-09-29 (×2): 20 mg via INTRAVENOUS

## 2013-09-29 MED ORDER — RIVAROXABAN 10 MG PO TABS
10.0000 mg | ORAL_TABLET | Freq: Every day | ORAL | Status: DC
  Administered 2013-09-30 – 2013-10-01 (×2): 10 mg via ORAL
  Filled 2013-09-29 (×3): qty 1

## 2013-09-29 MED ORDER — METOCLOPRAMIDE HCL 10 MG PO TABS: 5.0000 mg | ORAL_TABLET | Freq: Three times a day (TID) | ORAL | Status: DC | PRN

## 2013-09-29 MED ORDER — PROPOFOL 10 MG/ML IV BOLUS
INTRAVENOUS | Status: AC
  Filled 2013-09-29: qty 20

## 2013-09-29 MED ORDER — ROCURONIUM BROMIDE 100 MG/10ML IV SOLN
INTRAVENOUS | Status: AC
  Filled 2013-09-29: qty 1

## 2013-09-29 MED ORDER — POLYETHYLENE GLYCOL 3350 17 G PO PACK: 17.0000 g | PACK | Freq: Every day | ORAL | Status: DC | PRN

## 2013-09-29 MED ORDER — KETOROLAC TROMETHAMINE 15 MG/ML IJ SOLN: 7.5000 mg | Freq: Four times a day (QID) | INTRAMUSCULAR | Status: AC | PRN

## 2013-09-29 MED ORDER — MIDAZOLAM HCL 2 MG/2ML IJ SOLN
INTRAMUSCULAR | Status: AC
  Filled 2013-09-29: qty 2

## 2013-09-29 MED ORDER — ONDANSETRON HCL 4 MG PO TABS: 4.0000 mg | ORAL_TABLET | Freq: Four times a day (QID) | ORAL | Status: DC | PRN

## 2013-09-29 MED ORDER — ACETAMINOPHEN 650 MG RE SUPP: 650.0000 mg | Freq: Four times a day (QID) | RECTAL | Status: DC | PRN

## 2013-09-29 MED ORDER — LORATADINE 10 MG PO TABS
10.0000 mg | ORAL_TABLET | Freq: Every day | ORAL | Status: DC
  Administered 2013-09-29 – 2013-10-01 (×3): 10 mg via ORAL
  Filled 2013-09-29 (×3): qty 1

## 2013-09-29 MED ORDER — LIDOCAINE HCL (CARDIAC) 20 MG/ML IV SOLN
INTRAVENOUS | Status: AC
  Filled 2013-09-29: qty 5

## 2013-09-29 MED ORDER — MORPHINE SULFATE 2 MG/ML IJ SOLN
1.0000 mg | INTRAMUSCULAR | Status: DC | PRN
  Administered 2013-09-29: 1 mg via INTRAVENOUS
  Filled 2013-09-29: qty 1

## 2013-09-29 MED ORDER — SODIUM CHLORIDE 0.9 % IJ SOLN
INTRAMUSCULAR | Status: AC
  Filled 2013-09-29: qty 10

## 2013-09-29 MED ORDER — 0.9 % SODIUM CHLORIDE (POUR BTL) OPTIME
TOPICAL | Status: DC | PRN
  Administered 2013-09-29: 1000 mL

## 2013-09-29 MED ORDER — BUPIVACAINE HCL (PF) 0.25 % IJ SOLN
INTRAMUSCULAR | Status: DC | PRN
  Administered 2013-09-29: 20 mL

## 2013-09-29 MED ORDER — PHENOL 1.4 % MT LIQD
1.0000 | OROMUCOSAL | Status: DC | PRN
  Filled 2013-09-29: qty 177

## 2013-09-29 MED ORDER — SODIUM CHLORIDE 0.9 % IJ SOLN
INTRAMUSCULAR | Status: DC | PRN
  Administered 2013-09-29: 30 mL

## 2013-09-29 MED ORDER — ATORVASTATIN CALCIUM 20 MG PO TABS
20.0000 mg | ORAL_TABLET | Freq: Every day | ORAL | Status: DC
  Administered 2013-09-29 – 2013-09-30 (×2): 20 mg via ORAL
  Filled 2013-09-29 (×3): qty 1

## 2013-09-29 MED ORDER — DIPHENHYDRAMINE HCL 12.5 MG/5ML PO ELIX: 12.5000 mg | ORAL_SOLUTION | ORAL | Status: DC | PRN

## 2013-09-29 MED ORDER — OXYCODONE HCL 5 MG PO TABS
5.0000 mg | ORAL_TABLET | ORAL | Status: DC | PRN
  Administered 2013-09-29 – 2013-10-01 (×8): 5 mg via ORAL
  Administered 2013-10-01: 10 mg via ORAL
  Filled 2013-09-29 (×5): qty 1
  Filled 2013-09-29: qty 2
  Filled 2013-09-29 (×3): qty 1

## 2013-09-29 MED ORDER — ONDANSETRON HCL 4 MG/2ML IJ SOLN: 4.0000 mg | Freq: Four times a day (QID) | INTRAMUSCULAR | Status: DC | PRN

## 2013-09-29 MED ORDER — FENTANYL CITRATE 0.05 MG/ML IJ SOLN
INTRAMUSCULAR | Status: AC
  Filled 2013-09-29: qty 2

## 2013-09-29 MED ORDER — TRAMADOL HCL 50 MG PO TABS: 50.0000 mg | ORAL_TABLET | Freq: Four times a day (QID) | ORAL | Status: DC | PRN

## 2013-09-29 MED ORDER — ACEBUTOLOL HCL 200 MG PO CAPS
200.0000 mg | ORAL_CAPSULE | Freq: Two times a day (BID) | ORAL | Status: DC
  Administered 2013-09-29 – 2013-10-01 (×4): 200 mg via ORAL
  Filled 2013-09-29 (×5): qty 1

## 2013-09-29 MED ORDER — ATROPINE SULFATE 0.4 MG/ML IJ SOLN
INTRAMUSCULAR | Status: AC
  Filled 2013-09-29: qty 1

## 2013-09-29 MED ORDER — EPHEDRINE SULFATE 50 MG/ML IJ SOLN
INTRAMUSCULAR | Status: AC
  Filled 2013-09-29: qty 1

## 2013-09-29 MED ORDER — ACETAMINOPHEN 10 MG/ML IV SOLN
1000.0000 mg | Freq: Once | INTRAVENOUS | Status: AC
  Administered 2013-09-29: 1000 mg via INTRAVENOUS
  Filled 2013-09-29: qty 100

## 2013-09-29 MED ORDER — FENTANYL CITRATE 0.05 MG/ML IJ SOLN
INTRAMUSCULAR | Status: DC | PRN
  Administered 2013-09-29: 100 ug via INTRAVENOUS

## 2013-09-29 MED ORDER — PHENYLEPHRINE 40 MCG/ML (10ML) SYRINGE FOR IV PUSH (FOR BLOOD PRESSURE SUPPORT)
PREFILLED_SYRINGE | INTRAVENOUS | Status: AC
  Filled 2013-09-29: qty 10

## 2013-09-29 MED ORDER — CEFAZOLIN SODIUM-DEXTROSE 2-3 GM-% IV SOLR
2.0000 g | INTRAVENOUS | Status: AC
  Administered 2013-09-29: 2 g via INTRAVENOUS

## 2013-09-29 MED ORDER — LACTATED RINGERS IV SOLN
INTRAVENOUS | Status: DC | PRN
  Administered 2013-09-29: 08:00:00 via INTRAVENOUS

## 2013-09-29 MED ORDER — METHOCARBAMOL 1000 MG/10ML IJ SOLN
500.0000 mg | Freq: Four times a day (QID) | INTRAVENOUS | Status: DC | PRN
  Administered 2013-09-29: 500 mg via INTRAVENOUS
  Filled 2013-09-29: qty 5

## 2013-09-29 MED ORDER — PHENYLEPHRINE HCL 10 MG/ML IJ SOLN
INTRAMUSCULAR | Status: AC
  Filled 2013-09-29: qty 1

## 2013-09-29 MED ORDER — SODIUM CHLORIDE 0.9 % IV SOLN: INTRAVENOUS | Status: DC

## 2013-09-29 MED ORDER — ACETAMINOPHEN 500 MG PO TABS
1000.0000 mg | ORAL_TABLET | Freq: Four times a day (QID) | ORAL | Status: AC
  Administered 2013-09-29 – 2013-09-30 (×4): 1000 mg via ORAL
  Filled 2013-09-29 (×4): qty 2

## 2013-09-29 MED ORDER — HYDROMORPHONE HCL PF 1 MG/ML IJ SOLN: 0.2500 mg | INTRAMUSCULAR | Status: DC | PRN

## 2013-09-29 MED ORDER — FLEET ENEMA 7-19 GM/118ML RE ENEM: 1.0000 | ENEMA | Freq: Once | RECTAL | Status: AC | PRN

## 2013-09-29 MED ORDER — DEXAMETHASONE SODIUM PHOSPHATE 10 MG/ML IJ SOLN
10.0000 mg | Freq: Once | INTRAMUSCULAR | Status: AC
  Administered 2013-09-29: 10 mg via INTRAVENOUS

## 2013-09-29 MED ORDER — MENTHOL 3 MG MT LOZG
1.0000 | LOZENGE | OROMUCOSAL | Status: DC | PRN
  Filled 2013-09-29: qty 9

## 2013-09-29 MED ORDER — DEXTROSE-NACL 5-0.45 % IV SOLN
INTRAVENOUS | Status: DC
  Administered 2013-09-29: 13:00:00 via INTRAVENOUS

## 2013-09-29 MED ORDER — ACETAMINOPHEN 325 MG PO TABS: 650.0000 mg | ORAL_TABLET | Freq: Four times a day (QID) | ORAL | Status: DC | PRN

## 2013-09-29 MED ORDER — SODIUM CHLORIDE 0.9 % IV SOLN
10.0000 mg | INTRAVENOUS | Status: DC | PRN
  Administered 2013-09-29: 50 ug/min via INTRAVENOUS

## 2013-09-29 MED ORDER — PROPOFOL INFUSION 10 MG/ML OPTIME
INTRAVENOUS | Status: DC | PRN
  Administered 2013-09-29: 100 ug/kg/min via INTRAVENOUS

## 2013-09-29 MED ORDER — BISACODYL 10 MG RE SUPP: 10.0000 mg | Freq: Every day | RECTAL | Status: DC | PRN

## 2013-09-29 MED ORDER — POLYVINYL ALCOHOL 1.4 % OP SOLN
1.0000 [drp] | OPHTHALMIC | Status: DC | PRN
  Filled 2013-09-29: qty 15

## 2013-09-29 MED ORDER — CHLORHEXIDINE GLUCONATE 4 % EX LIQD: 60.0000 mL | Freq: Once | CUTANEOUS | Status: DC

## 2013-09-29 MED ORDER — DEXAMETHASONE SODIUM PHOSPHATE 10 MG/ML IJ SOLN
10.0000 mg | Freq: Every day | INTRAMUSCULAR | Status: AC
  Filled 2013-09-29: qty 1

## 2013-09-29 MED ORDER — PROMETHAZINE HCL 25 MG/ML IJ SOLN: 6.2500 mg | INTRAMUSCULAR | Status: DC | PRN

## 2013-09-29 MED ORDER — KETAMINE HCL 10 MG/ML IJ SOLN
INTRAMUSCULAR | Status: AC
Start: 2013-09-29 — End: 2013-09-29
  Filled 2013-09-29: qty 1

## 2013-09-29 MED ORDER — DOCUSATE SODIUM 100 MG PO CAPS
100.0000 mg | ORAL_CAPSULE | Freq: Two times a day (BID) | ORAL | Status: DC
  Administered 2013-09-29 – 2013-10-01 (×4): 100 mg via ORAL

## 2013-09-29 MED ORDER — METHOCARBAMOL 500 MG PO TABS
500.0000 mg | ORAL_TABLET | Freq: Four times a day (QID) | ORAL | Status: DC | PRN
  Administered 2013-09-29 – 2013-10-01 (×6): 500 mg via ORAL
  Filled 2013-09-29 (×6): qty 1

## 2013-09-29 MED ORDER — BUPIVACAINE LIPOSOME 1.3 % IJ SUSP
20.0000 mL | Freq: Once | INTRAMUSCULAR | Status: AC
  Administered 2013-09-29: 20 mL
  Filled 2013-09-29: qty 20

## 2013-09-29 MED ORDER — CEFAZOLIN SODIUM-DEXTROSE 2-3 GM-% IV SOLR
INTRAVENOUS | Status: AC
  Filled 2013-09-29: qty 50

## 2013-09-29 MED ORDER — MIDAZOLAM HCL 5 MG/5ML IJ SOLN
INTRAMUSCULAR | Status: DC | PRN
  Administered 2013-09-29: 2 mg via INTRAVENOUS

## 2013-09-29 MED ORDER — BUPIVACAINE IN DEXTROSE 0.75-8.25 % IT SOLN
INTRATHECAL | Status: DC | PRN
  Administered 2013-09-29: 2 mL via INTRATHECAL

## 2013-09-29 MED ORDER — BUPIVACAINE HCL (PF) 0.25 % IJ SOLN
INTRAMUSCULAR | Status: AC
  Filled 2013-09-29: qty 30

## 2013-09-29 MED ORDER — LACTATED RINGERS IV SOLN
INTRAVENOUS | Status: DC | PRN
  Administered 2013-09-29 (×2): via INTRAVENOUS

## 2013-09-29 SURGICAL SUPPLY — 45 items
BAG ZIPLOCK 12X15 (MISCELLANEOUS) IMPLANT
BLADE SAW SGTL 18X1.27X75 (BLADE) ×2 IMPLANT
CAPT HIP PF COP ×2 IMPLANT
COVER PERINEAL POST (MISCELLANEOUS) ×2 IMPLANT
DECANTER SPIKE VIAL GLASS SM (MISCELLANEOUS) ×2 IMPLANT
DRAPE C-ARM 42X120 X-RAY (DRAPES) ×2 IMPLANT
DRAPE STERI IOBAN 125X83 (DRAPES) ×2 IMPLANT
DRAPE U-SHAPE 47X51 STRL (DRAPES) ×6 IMPLANT
DRSG ADAPTIC 3X8 NADH LF (GAUZE/BANDAGES/DRESSINGS) ×2 IMPLANT
DRSG MEPILEX BORDER 4X4 (GAUZE/BANDAGES/DRESSINGS) ×2 IMPLANT
DRSG MEPILEX BORDER 4X8 (GAUZE/BANDAGES/DRESSINGS) ×2 IMPLANT
DURAPREP 26ML APPLICATOR (WOUND CARE) ×2 IMPLANT
ELECT BLADE 6.5 EXT (BLADE) ×2 IMPLANT
ELECT REM PT RETURN 9FT ADLT (ELECTROSURGICAL) ×2
ELECTRODE REM PT RTRN 9FT ADLT (ELECTROSURGICAL) ×1 IMPLANT
EVACUATOR 1/8 PVC DRAIN (DRAIN) ×2 IMPLANT
FACESHIELD WRAPAROUND (MASK) ×6 IMPLANT
GLOVE BIO SURGEON STRL SZ7.5 (GLOVE) ×2 IMPLANT
GLOVE BIO SURGEON STRL SZ8 (GLOVE) ×4 IMPLANT
GLOVE BIOGEL PI IND STRL 6.5 (GLOVE) ×1 IMPLANT
GLOVE BIOGEL PI IND STRL 8 (GLOVE) ×2 IMPLANT
GLOVE BIOGEL PI IND STRL 8.5 (GLOVE) ×1 IMPLANT
GLOVE BIOGEL PI INDICATOR 6.5 (GLOVE) ×1
GLOVE BIOGEL PI INDICATOR 8 (GLOVE) ×2
GLOVE BIOGEL PI INDICATOR 8.5 (GLOVE) ×1
GLOVE SURG SS PI 6.5 STRL IVOR (GLOVE) ×2 IMPLANT
GOWN STRL REIN XL XLG (GOWN DISPOSABLE) ×2 IMPLANT
GOWN STRL REUS W/TWL LRG LVL3 (GOWN DISPOSABLE) ×2 IMPLANT
GOWN STRL REUS W/TWL XL LVL3 (GOWN DISPOSABLE) ×4 IMPLANT
KIT BASIN OR (CUSTOM PROCEDURE TRAY) ×2 IMPLANT
NDL SAFETY ECLIPSE 18X1.5 (NEEDLE) ×2 IMPLANT
NEEDLE HYPO 18GX1.5 SHARP (NEEDLE) ×2
PACK TOTAL JOINT (CUSTOM PROCEDURE TRAY) ×2 IMPLANT
SPONGE GAUZE 4X4 12PLY (GAUZE/BANDAGES/DRESSINGS) IMPLANT
STRIP CLOSURE SKIN 1/2X4 (GAUZE/BANDAGES/DRESSINGS) ×2 IMPLANT
SUCTION FRAZIER 12FR DISP (SUCTIONS) IMPLANT
SUT ETHIBOND NAB CT1 #1 30IN (SUTURE) ×2 IMPLANT
SUT MNCRL AB 4-0 PS2 18 (SUTURE) ×2 IMPLANT
SUT VIC AB 2-0 CT1 27 (SUTURE) ×2
SUT VIC AB 2-0 CT1 TAPERPNT 27 (SUTURE) ×2 IMPLANT
SUT VLOC 180 0 24IN GS25 (SUTURE) ×2 IMPLANT
SYR 20CC LL (SYRINGE) ×2 IMPLANT
SYR 50ML LL SCALE MARK (SYRINGE) ×2 IMPLANT
TOWEL OR 17X26 10 PK STRL BLUE (TOWEL DISPOSABLE) ×2 IMPLANT
TRAY FOLEY CATH 14FRSI W/METER (CATHETERS) ×2 IMPLANT

## 2013-09-29 NOTE — Anesthesia Postprocedure Evaluation (Signed)
  Anesthesia Post-op Note  Patient: Alicia Schroeder  Procedure(s) Performed: Procedure(s) (LRB): LEFT TOTAL HIP ARTHROPLASTY ANTERIOR APPROACH (Left)  Patient Location: PACU  Anesthesia Type: Spinal  Level of Consciousness: awake and alert   Airway and Oxygen Therapy: Patient Spontanous Breathing  Post-op Pain: mild  Post-op Assessment: Post-op Vital signs reviewed, Patient's Cardiovascular Status Stable, Respiratory Function Stable, Patent Airway and No signs of Nausea or vomiting  Last Vitals:  Filed Vitals:   09/29/13 1115  BP: 121/62  Pulse: 54  Temp: 36.3 C  Resp: 12    Post-op Vital Signs: stable   Complications: No apparent anesthesia complications

## 2013-09-29 NOTE — Anesthesia Procedure Notes (Signed)

## 2013-09-29 NOTE — Progress Notes (Signed)
X-ray results noted 

## 2013-09-29 NOTE — Transfer of Care (Signed)
Immediate Anesthesia Transfer of Care Note  Patient: Alicia Schroeder  Procedure(s) Performed: Procedure(s): LEFT TOTAL HIP ARTHROPLASTY ANTERIOR APPROACH (Left)  Patient Location: PACU  Anesthesia Type:Spinal  Level of Consciousness: awake, alert , oriented and patient cooperative  Airway & Oxygen Therapy: Patient Spontanous Breathing, Patient connected to face mask oxygen and SAB level T10  Post-op Assessment: Report given to PACU RN and Post -op Vital signs reviewed and stable  Post vital signs: Reviewed and stable  Complications: No apparent anesthesia complications

## 2013-09-29 NOTE — Plan of Care (Signed)
Problem: Consults Goal: Diagnosis- Total Joint Replacement Left anterior hip     

## 2013-09-29 NOTE — H&P (View-Only) (Signed)
TOTAL HIP ADMISSION H&P  Patient is admitted for left total hip arthroplasty - anterior approach.  Subjective:  Chief Complaint: left hip pain  HPI: Alicia Schroeder, 63 y.o. female, has a history of pain and functional disability in the left hip(s) due to arthritis and patient has failed non-surgical conservative treatments for greater than 12 weeks to include NSAID's and/or analgesics, flexibility and strengthening excercises and activity modification.  Onset of symptoms was gradual starting several years ago with gradually worsening course since that time.The patient noted no past surgery on the left hip(s).  Patient currently rates pain in the left hip at moderate with activity. Patient has worsening of pain with activity and weight bearing and pain that interfers with activities of daily living. Patient has evidence of bone on bone arthritis with flattening of the femoral head by imaging studies. This condition presents safety issues increasing the risk of falls. This patient has had continued pain.  There is no current active infection.  Patient Active Problem List   Diagnosis Date Noted  . Menopause 09/28/2013  . Cancer of the skin, basal cell 09/28/2013  . Obesity (BMI 30-39.9) 07/20/2013  . Preoperative cardiovascular examination 07/20/2013  . PVC's (premature ventricular contractions) 07/20/2013  . HTN (hypertension) 07/20/2013  . Mixed hyperlipidemia 07/20/2013   Past Medical History  Diagnosis Date  . Hyperlipidemia   . Hypertension   . Arthritis      right greater the left hip  . Abnormal ECG   . Family history of premature coronary artery disease     Father with extensive coronary and carotid disease  . Family history of anesthesia complication     sisterin law disabled from anesthesia   . Premature ventricular contractions   . Dysrhythmia     pvc's   . Heart murmur     no evidence per patient   . Pneumonia     15 years ago approximately   . Cancer     basal cell  carcinoma removed- 24 years ago     Past Surgical History  Procedure Laterality Date  . Transthoracic echocardiogram  08/05/2013    Normal LV size and function. EF 60-65%. No regional WMA; grade 1 diastolic dysfunction with mild LA dilation; no significant valvular lesions -- mild aortic valve calcification/sclerosis (could explain a soft systolic murmur)  . Nm lexiscan myoview ltd  08/03/2013    Normal study. No ischemia or infarction. Normal wall motion. EF 71%    Medications: Cetrizine CoQ10 Vit D3 Triple Flex Lisinopril Atorvastatin Aspirin 81 mg Acebutolol Aleve Celebrex  No Known Allergies  History  Substance Use Topics  . Smoking status: Never Smoker   . Smokeless tobacco: Never Used  . Alcohol Use: Yes     Comment: occasional     Family History  Problem Relation Age of Onset  . Parkinson's disease Mother   . Thyroid disease Mother   . Hypertension Mother   . Heart failure Mother   . Hypertension Father   . Hyperlipidemia Father   . Heart disease Father 61    Triple bypass.   . Kidney failure Father   . Stroke Maternal Grandmother   . Transient ischemic attack Maternal Grandmother   . Arrhythmia Maternal Grandfather     Pacemaker  . Heart disease Paternal Grandmother   . Parkinson's disease Paternal Grandfather   . Hypertension Paternal Grandfather      Review of Systems  Constitutional: Negative.   HENT: Negative.   Respiratory: Negative.  Cardiovascular: Negative.   Gastrointestinal: Negative.   Genitourinary: Negative.   Musculoskeletal: Positive for joint pain.  Neurological: Negative.   Psychiatric/Behavioral: Negative.     Objective:  Physical Exam  Constitutional: She is oriented to person, place, and time. She appears well-developed and well-nourished. No distress.  HENT:  Head: Normocephalic and atraumatic.  Eyes: EOM are normal. Pupils are equal, round, and reactive to light.  Neck: Neck supple. Carotid bruit is not present.   Cardiovascular: Normal rate and regular rhythm.   No murmur heard. Respiratory: Breath sounds normal. She has no wheezes. She has no rales.  GI: Soft. Bowel sounds are normal. She exhibits distension (generalized mild).  Musculoskeletal:       Left hip: She exhibits decreased range of motion (Flex 95 degrees, Zero IR, 10 ER, 20 Abduction).  Neurological: She is alert and oriented to person, place, and time.    Vital signs in last 24 hours: BP 154/88 Resp 12  Labs:   Estimated body mass index is 37.28 kg/(m^2) as calculated from the following:   Height as of 09/22/13: 5\' 5"  (1.651 m).   Weight as of 09/22/13: 101.606 kg (224 lb).   Imaging Review Plain radiographs demonstrate severe degenerative joint disease of the left hip(s). The bone quality appears to be good for age and reported activity level.  Assessment/Plan:  End stage arthritis, left hip(s)  The patient history, physical examination, clinical judgement of the provider and imaging studies are consistent with end stage degenerative joint disease of the left hip(s) and total hip arthroplasty - anterior approach is deemed medically necessary. The treatment options including medical management, injection therapy, arthroscopy and arthroplasty were discussed at length. The risks and benefits of total hip arthroplasty were presented and reviewed. The risks due to aseptic loosening, infection, stiffness, dislocation/subluxation,  thromboembolic complications and other imponderables were discussed.  The patient acknowledged the explanation, agreed to proceed with the plan and consent was signed. Patient is being admitted for inpatient treatment for surgery, pain control, PT, OT, prophylactic antibiotics, VTE prophylaxis, progressive ambulation and ADL's and discharge planning.The patient is planning to be discharged to skilled nursing facility, [facility] or [facility] .  The patient will receive TXA.  PCP - Dr. Jonathon Jordan  Arlee Muslim, PA-C

## 2013-09-29 NOTE — Progress Notes (Signed)
Portable AP Pelvis X-ray done. 

## 2013-09-29 NOTE — Interval H&P Note (Signed)
History and Physical Interval Note:  09/29/2013 8:05 AM  Alicia Schroeder  has presented today for surgery, with the diagnosis of left hip osteoarthritis  The various methods of treatment have been discussed with the patient and family. After consideration of risks, benefits and other options for treatment, the patient has consented to  Procedure(s): LEFT TOTAL HIP ARTHROPLASTY ANTERIOR APPROACH (Left) as a surgical intervention .  The patient's history has been reviewed, patient examined, no change in status, stable for surgery.  I have reviewed the patient's chart and labs.  Questions were answered to the patient's satisfaction.     Dione Plover Devaun Hernandez

## 2013-09-29 NOTE — Op Note (Signed)
OPERATIVE REPORT  PREOPERATIVE DIAGNOSIS: Osteoarthritis of the Left hip.   POSTOPERATIVE DIAGNOSIS: Osteoarthritis of the Left  hip.   PROCEDURE: Left total hip arthroplasty, anterior approach.   SURGEON: Gaynelle Arabian, MD   ASSISTANT: Arlee Muslim, PA-C  ANESTHESIA:  Spinal  ESTIMATED BLOOD LOSS:- 300 ml  DRAINS: Hemovac x1.   COMPLICATIONS: None   CONDITION: PACU - hemodynamically stable.   BRIEF CLINICAL NOTE: Alicia Schroeder is a 63 y.o. female who has advanced end-  stage arthritis of his Left  hip with progressively worsening pain and  dysfunction.The patient has failed nonoperative management and presents for  total hip arthroplasty.   PROCEDURE IN DETAIL: After successful administration of spinal  anesthetic, the traction boots for the [facility] bed were placed on both  feet and the patient was placed onto the [facility] bed, boots placed into the leg  holders. The Left hip was then isolated from the perineum with plastic  drapes and prepped and draped in the usual sterile fashion. ASIS and  greater trochanter were marked and a oblique incision was made, starting  at about 1 cm lateral and 2 cm distal to the ASIS and coursing towards  the anterior cortex of the femur. The skin was cut with a 10 blade  through subcutaneous tissue to the level of the fascia overlying the  tensor fascia lata muscle. The fascia was then incised in line with the  incision at the junction of the anterior third and posterior 2/3rd. The  muscle was teased off the fascia and then the interval between the TFL  and the rectus was developed. The Hohmann retractor was then placed at  the top of the femoral neck over the capsule. The vessels overlying the  capsule were cauterized and the fat on top of the capsule was removed.  A Hohmann retractor was then placed anterior underneath the rectus  femoris to give exposure to the entire anterior capsule. A T-shaped  capsulotomy was performed. The  edges were tagged and the femoral head  was identified.       Osteophytes are removed off the superior acetabulum.  The femoral neck was then cut in situ with an oscillating saw. Traction  was then applied to the left lower extremity utilizing the [facility]  traction. The femoral head was then removed. Retractors were placed  around the acetabulum and then circumferential removal of the labrum was  performed. Osteophytes were also removed. Reaming starts at 45 mm to  medialize and  Increased in 2 mm increments to 49 mm. We reamed in  approximately 40 degrees of abduction, 20 degrees anteversion. A 50 mm  pinnacle acetabular shell was then impacted in anatomic position under  fluoroscopic guidance with excellent purchase. We did not need to place  any additional dome screws. A 32 mm neutral + 4 marathon liner was then  placed into the acetabular shell.       The femoral lift was then placed along the lateral aspect of the femur  just distal to the vastus ridge. The leg was  externally rotated and capsule  was stripped off the inferior aspect of the femoral neck down to the  level of the lesser trochanter, this was done with electrocautery. The femur was lifted after this was performed. The  leg was then placed and extended in adducted position to essentially delivering the femur. We also removed the capsule superiorly and the  piriformis from the piriformis fossa  to gain excellent exposure of the  proximal femur. Rongeur was used to remove some cancellous bone to get  into the lateral portion of the proximal femur for placement of the  initial starter reamer. The starter broaches was placed  the starter broach  and was shown to go down the center of the canal. Broaching  with the  Corail system was then performed starting at size 8, coursing  Up to size 12. A size 12 had excellent torsional and rotational  and axial stability. The trial standard offset neck was then placed  with a 32 + 1 trial  head. The hip was then reduced. We confirmed that  the stem was in the canal both on AP and lateral x-rays. It also has excellent sizing. The hip was reduced with outstanding stability through full extension, full external rotation,  and then flexion in adduction internal rotation. AP pelvis was taken  and the leg lengths were measured and found to be exactly equal. Hip  was then dislocated again and the femoral head and neck removed. The  femoral broach was removed. Size 12 Corail stem with a standard offset  neck was then impacted into the femur following native anteversion. Has  excellent purchase in the canal. Excellent torsional and rotational and  axial stability. It is confirmed to be in the canal on AP and lateral  fluoroscopic views. The 32 + 1 ceramic head was placed and the hip  reduced with outstanding stability. Again AP pelvis was taken and it  confirmed that the leg lengths were equal. The wound was then copiously  irrigated with saline solution and the capsule reattached and repaired  with Ethibond suture.  20 mL of Exparel mixed with 50 mL of saline then additional 20 ml of .25% Bupivicaine injected into the capsule and into the edge of the tensor fascia lata as well as subcutaneous tissue. The fascia overlying the tensor fascia lata was  then closed with a running #1 V-Loc. Subcu was closed with interrupted  2-0 Vicryl and subcuticular running 4-0 Monocryl. Incision was cleaned  and dried. Steri-Strips and a bulky sterile dressing applied. Hemovac  drain was hooked to suction and then he was awakened and transported to  recovery in stable condition.        Please note that a surgical assistant was a medical necessity for this procedure to perform it in a safe and expeditious manner. Assistant was necessary to provide appropriate retraction of vital neurovascular structures and to prevent femoral fracture and allow for anatomic placement of the prosthesis.  Gaynelle Arabian, M.D.

## 2013-09-29 NOTE — Progress Notes (Addendum)
Clinical Social Work Department CLINICAL SOCIAL WORK PLACEMENT NOTE 09/29/2013  Patient:  Alicia Schroeder, Alicia Schroeder  Account Number:  [id number] [place] date:  09/29/2013  Clinical Social Worker:  Werner Lean, LCSW  Date/time:  09/29/2013 02:57 PM  Clinical Social Work is seeking post-discharge placement for this patient at the following level of care:   SKILLED NURSING   (*CSW will update this form in Epic as items are completed)     Patient/family provided with [place] Department of Clinical Social Work's list of facilities offering this level of care within the geographic area requested by the patient (or if unable, by the patient's family).  09/29/2013  Patient/family informed of their freedom to choose among providers that offer the needed level of care, that participate in Medicare, Medicaid or managed care program needed by the patient, have an available bed and are willing to accept the patient.    Patient/family informed of [facility]' ownership interest in [facility], as well as of the fact that they are under no obligation to receive care at this facility.  PASARR submitted to EDS on 09/29/2013 PASARR number received from EDS on 09/30/13  FL2 transmitted to all facilities in geographic area requested by pt/family on  09/29/2013 FL2 transmitted to all facilities within larger geographic area on   Patient informed that his/her managed care company has contracts with or will negotiate with  certain facilities, including the following:     Patient/family informed of bed offers received:  09/29/2013 Patient chooses bed at [facility] Physician recommends and patient chooses bed at    Patient to be transferred to  on   Patient to be transferred to facility by   The following physician request were entered in Epic:   Additional Comments:  Werner Lean LCSW [phone]

## 2013-09-29 NOTE — Progress Notes (Signed)
Clinical Social Work Department BRIEF PSYCHOSOCIAL ASSESSMENT 09/29/2013  Patient:  Alicia Schroeder, Alicia Schroeder     Account Number:  [id number]     [place] date:  09/29/2013  Clinical Social Worker:  Lacie Scotts  Date/Time:  09/29/2013 02:48 PM  Referred by:  Physician  Date Referred:  09/29/2013 Referred for  SNF Placement   Other Referral:   Interview type:  Patient Other interview type:    PSYCHOSOCIAL DATA Living Status:  ALONE Admitted from facility:   Level of care:   Primary support name:  Alicia Schroeder Primary support relationship to patient:  FAMILY Degree of support available:   unclear    CURRENT CONCERNS Current Concerns  Post-Acute Placement   Other Concerns:    SOCIAL WORK ASSESSMENT / PLAN Pt is a 63 yr old female living at home prior to hospitalization. CSW met with pt to assist with d/c planning. This is a planned admissiom. Pt has made prior arrangements to have ST Rehab at [facility] following hospital d/c. CSW has contacted SNF and d/c plan has been confirmed. Pt has BCBS which requires prior authorization. CSW will assist SNf with this process.   Assessment/plan status:  Psychosocial Support/Ongoing Assessment of Needs Other assessment/ plan:   Information/referral to community resources:   Insurance coverage for SNF and ambulance transport reviewed.    PATIENT'S/FAMILY'S RESPONSE TO PLAN OF CARE: Pt's mood is bright. Pain is being controlled. Pt toured [facility] prior to surgery and is looking forward to having her rehab there.   Werner Lean LCSW [phone]

## 2013-09-29 NOTE — Care Management Note (Addendum)
    Page 1 of 1   09/29/2013     3:12:39 PM CARE MANAGEMENT NOTE 09/29/2013  Patient:  Alicia Schroeder, Alicia Schroeder   Account Number:  [id number]  Date Initiated:  09/29/2013  Documentation initiated by:  [facility]  Subjective/Objective Assessment:   adm: L hip pain; L TOTAL HIP ARTHROPLASTY ANTERIOR APPROACH     Action/Plan:   SNF for rehab   Anticipated DC Date:  09/30/2013   Anticipated DC Plan:  [place]  CM consult      Choice offered to / List presented to:             Status of service:  Completed, signed off Medicare Important Message given?   (If response is "NO", the following Medicare IM given date fields will be blank) Date Medicare IM given:   Date Additional Medicare IM given:    Discharge Disposition:  [place]  Per UR Regulation:    If discussed at Long Length of Stay Meetings, dates discussed:    Comments:  09/29/13 12:50 CM reviewed; CSW arranging for SNF for Rehab. No other CM needs were communicated.  Mariane Masters, BSN, CM [phone].

## 2013-09-29 NOTE — Evaluation (Signed)
Physical Therapy Evaluation Patient Details Name: Alicia Schroeder MRN: [id number] DOB: 04-25-1951 Today's Date: 09/29/2013   History of Present Illness  63 yo female s/p L THA-direct anterior 5/13.   Clinical Impression  On eval POD 0, pt required Min assist for mobility-able to ambulate ~60 feet with walker. Pt plans to d/c to [facility] for continued rehab.    Follow Up Recommendations SNF    Equipment Recommendations  Rolling walker with 5" wheels    Recommendations for Other Services OT consult     Precautions / Restrictions Precautions Precautions: Fall Restrictions Weight Bearing Restrictions: No LLE Weight Bearing: Weight bearing as tolerated      Mobility  Bed Mobility Overal bed mobility: Needs Assistance Bed Mobility: Supine to Sit     Supine to sit: Min assist     General bed mobility comments: assist for L LE  Transfers Overall transfer level: Needs assistance Equipment used: Rolling walker (2 wheeled) Transfers: Sit to/from Stand Sit to Stand: Min assist         General transfer comment: assist for L LE. VCs safety, technique, hand placement  Ambulation/Gait Ambulation/Gait assistance: Min assist Ambulation Distance (Feet): 60 Feet Assistive device: Rolling walker (2 wheeled) Gait Pattern/deviations: Step-to pattern;Step-through pattern;Antalgic;Decreased stride length     General Gait Details: assist to stabilize throughout ambulation. VCs safety, technique, sequence.   Stairs            Wheelchair Mobility    Modified Rankin (Stroke Patients Only)       Balance                                             Pertinent Vitals/Pain 5/10 L hip. Ice applied end of session    Home Living Family/patient expects to be discharged to:: Skilled nursing facility Living Arrangements: Alone                    Prior Function Level of Independence: Independent               Hand Dominance         Extremity/Trunk Assessment   Upper Extremity Assessment: Overall WFL for tasks assessed           Lower Extremity Assessment: LLE deficits/detail   LLE Deficits / Details: hip flex at least 2/5, hip abd/add at least 2/5, moves ankle well  Cervical / Trunk Assessment: Normal  Communication   Communication: No difficulties  Cognition Arousal/Alertness: Awake/alert Behavior During Therapy: WFL for tasks assessed/performed Overall Cognitive Status: Within Functional Limits for tasks assessed                      General Comments      Exercises        Assessment/Plan    PT Assessment Patient needs continued PT services  PT Diagnosis Difficulty walking;Abnormality of gait;Generalized weakness;Acute pain   PT Problem List Decreased strength;Decreased range of motion;Decreased activity tolerance;Decreased balance;Decreased mobility;Obesity;Decreased knowledge of use of DME;Pain  PT Treatment Interventions DME instruction;Gait training;Functional mobility training;Therapeutic activities;Therapeutic exercise;Patient/family education;Balance training   PT Goals (Current goals can be found in the Care Plan section) Acute Rehab PT Goals Patient Stated Goal: rehab to regain independence PT Goal Formulation: With patient/family Time For Goal Achievement: 10/06/13 Potential to Achieve Goals: Good    Frequency 7X/week   Barriers to discharge  Co-evaluation               End of Session   Activity Tolerance: Patient tolerated treatment well Patient left: in chair;with call bell/phone within reach;with family/visitor present           Time: 1550-1610 PT Time Calculation (min): 20 min   Charges:   PT Evaluation $Initial PT Evaluation Tier I: 1 Procedure PT Treatments $Gait Training: 8-22 mins   PT G Codes:          Weston Anna, MPT Pager: [phone]

## 2013-09-30 DIAGNOSIS — D62 Acute posthemorrhagic anemia: Secondary | ICD-10-CM | POA: Diagnosis not present

## 2013-09-30 LAB — CBC
HCT: 31.5 % — ABNORMAL LOW (ref 36.0–46.0)
HEMOGLOBIN: 10.3 g/dL — AB (ref 12.0–15.0)
MCH: 29.3 pg (ref 26.0–34.0)
MCHC: 32.7 g/dL (ref 30.0–36.0)
MCV: 89.7 fL (ref 78.0–100.0)
Platelets: 262 10*3/uL (ref 150–400)
RBC: 3.51 MIL/uL — AB (ref 3.87–5.11)
RDW: 13.3 % (ref 11.5–15.5)
WBC: 11.3 10*3/uL — AB (ref 4.0–10.5)

## 2013-09-30 LAB — BASIC METABOLIC PANEL
BUN: 12 mg/dL (ref 6–23)
CALCIUM: 9.9 mg/dL (ref 8.4–10.5)
CHLORIDE: 105 meq/L (ref 96–112)
CO2: 24 meq/L (ref 19–32)
Creatinine, Ser: 0.52 mg/dL (ref 0.50–1.10)
GFR calc Af Amer: >90 mL/min (ref >90)
GFR calc non Af Amer: >90 mL/min (ref >90)
Glucose, Bld: 138 mg/dL — ABNORMAL HIGH (ref 70–99)
Potassium: 4.3 mEq/L (ref 3.7–5.3)
SODIUM: 140 meq/L (ref 137–147)

## 2013-09-30 NOTE — Progress Notes (Signed)
Physical Therapy Treatment Patient Details Name: Alicia Schroeder MRN: [id number] DOB: February 07, 1951 Today's Date: 09/30/2013    History of Present Illness 63 yo female s/p L THA-direct anterior 5/13.     PT Comments    Progressing well with mobility.   Follow Up Recommendations  SNF     Equipment Recommendations       Recommendations for Other Services OT consult     Precautions / Restrictions Precautions Precautions: Fall Restrictions Weight Bearing Restrictions: No LLE Weight Bearing: Weight bearing as tolerated    Mobility  Bed Mobility   Bed Mobility: Supine to Sit     Supine to sit: Min assist     General bed mobility comments: assist for L LE  Transfers Overall transfer level: Needs assistance Equipment used: Rolling walker (2 wheeled) Transfers: Sit to/from Stand Sit to Stand: Min assist         General transfer comment: Assist to rise, control descent from lower surfaces. Min guard from recliner  Ambulation/Gait Ambulation/Gait assistance: Min guard Ambulation Distance (Feet): 150 Feet Assistive device: Rolling walker (2 wheeled) Gait Pattern/deviations: Step-through pattern;Antalgic;Decreased stride length     General Gait Details: close guard for safety.    Stairs            Wheelchair Mobility    Modified Rankin (Stroke Patients Only)       Balance                                    Cognition Arousal/Alertness: Awake/alert Behavior During Therapy: WFL for tasks assessed/performed Overall Cognitive Status: Within Functional Limits for tasks assessed                      Exercises Total Joint Exercises Ankle Circles/Pumps: AROM;Both;10 reps;Seated Quad Sets: AROM;Both;10 reps;Seated Heel Slides: AAROM;Left;10 reps;Seated Hip ABduction/ADduction: AAROM;Left;10 reps;Seated Long Arc Quad: AROM;Both;10 reps;Seated    General Comments        Pertinent Vitals/Pain 4/10 L hip/thigh area. Ice applied  end of session    Home Living Family/patient expects to be discharged to:: Skilled nursing facility                    Prior Function Level of Independence: Independent          PT Goals (current goals can now be found in the care plan section) Acute Rehab PT Goals Patient Stated Goal: rehab to regain independence Progress towards PT goals: Progressing toward goals    Frequency  7X/week    PT Plan Current plan remains appropriate    Co-evaluation             End of Session   Activity Tolerance: Patient tolerated treatment well Patient left: in chair     Time: 6644-0347 PT Time Calculation (min): 28 min  Charges:  $Gait Training: 23-37 mins                    G Codes:      Weston Anna, MPT Pager: [phone]

## 2013-09-30 NOTE — Progress Notes (Signed)
Physical Therapy Treatment Patient Details Name: Alicia Schroeder MRN: [id number] DOB: Feb 01, 1951 Today's Date: 09/30/2013    History of Present Illness 63 yo female s/p L THA-direct anterior 5/13.     PT Comments    Progressing well with mobility.   Follow Up Recommendations  SNF     Equipment Recommendations  Rolling walker with 5" wheels    Recommendations for Other Services OT consult     Precautions / Restrictions Precautions Precautions: Fall Restrictions Weight Bearing Restrictions: No LLE Weight Bearing: Weight bearing as tolerated    Mobility  Bed Mobility                  Transfers Overall transfer level: Needs assistance Equipment used: Rolling walker (2 wheeled) Transfers: Sit to/from Stand Sit to Stand: Min guard         General transfer comment: Assist to rise, control descent from lower surfaces. Min guard from recliner  Ambulation/Gait Ambulation/Gait assistance: Min guard Ambulation Distance (Feet): 250 Feet Assistive device: Rolling walker (2 wheeled) Gait Pattern/deviations: Step-through pattern;Antalgic;Decreased stride length     General Gait Details: close guard for safety.    Stairs            Wheelchair Mobility    Modified Rankin (Stroke Patients Only)       Balance                                    Cognition Arousal/Alertness: Awake/alert Behavior During Therapy: WFL for tasks assessed/performed Overall Cognitive Status: Within Functional Limits for tasks assessed                      Exercises    General Comments        Pertinent Vitals/Pain 6/10 L hip/thigh area. Ice applied end of session    Home Living                      Prior Function            PT Goals (current goals can now be found in the care plan section) Progress towards PT goals: Progressing toward goals    Frequency  7X/week    PT Plan Current plan remains appropriate    Co-evaluation              End of Session   Activity Tolerance: Patient tolerated treatment well Patient left: in chair;with call bell/phone within reach;with family/visitor present     Time: 6378-5885 PT Time Calculation (min): 12 min  Charges:  $Gait Training: 8-22 mins                    G Codes:      Weston Anna, MPT Pager: [phone]

## 2013-09-30 NOTE — Progress Notes (Signed)
   Subjective: 1 Day Post-Op Procedure(s) (LRB): LEFT TOTAL HIP ARTHROPLASTY ANTERIOR APPROACH (Left) Patient reports pain as mild.   Patient seen in rounds by Dr. Wynelle Link.  She is doing well today. Patient is well, and has had no acute complaints or problems We will start therapy today.  Plan is to go [facility] or [facility] after hospital stay.  Objective: Vital signs in last 24 hours: Temp:  [97.1 F (36.2 C)-98.5 F (36.9 C)] 98.1 F (36.7 C) (05/14 0505) Pulse Rate:  [52-85] 72 (05/14 0505) Resp:  [7-20] 18 (05/14 0505) BP: (105-150)/(57-86) 118/75 mmHg (05/14 0505) SpO2:  [97 %-100 %] 98 % (05/14 0505) Weight:  [101.606 kg (224 lb)] 101.606 kg (224 lb) (05/13 1200)  Intake/Output from previous day:  Intake/Output Summary (Last 24 hours) at 09/30/13 0726 Last data filed at 09/30/13 0506  Gross per 24 hour  Intake 3516.25 ml  Output   6032 ml  Net -2515.75 ml    Intake/Output this shift:    Labs:  Recent Labs  09/30/13 0422  HGB 10.3*    Recent Labs  09/30/13 0422  WBC 11.3*  RBC 3.51*  HCT 31.5*  PLT 262    Recent Labs  09/30/13 0422  NA 140  K 4.3  CL 105  CO2 24  BUN 12  CREATININE 0.52  GLUCOSE 138*  CALCIUM 9.9   No results found for this basename: LABPT, INR,  in the last 72 hours  EXAM General - Patient is Alert, Appropriate and Oriented Extremity - Neurovascular intact Sensation intact distally Dorsiflexion/Plantar flexion intact Dressing - dressing C/D/I Motor Function - intact, moving foot and toes well on exam.  Hemovac pulled without difficulty.  Past Medical History  Diagnosis Date  . Hyperlipidemia   . Hypertension   . Arthritis      right greater the left hip  . Abnormal ECG   . Family history of premature coronary artery disease     Father with extensive coronary and carotid disease  . Family history of anesthesia complication     sisterin law disabled from anesthesia   . Premature ventricular contractions     . Dysrhythmia     pvc's   . Heart murmur     no evidence per patient   . Pneumonia     15 years ago approximately   . Cancer     basal cell carcinoma removed- 24 years ago     Assessment/Plan: 1 Day Post-Op Procedure(s) (LRB): LEFT TOTAL HIP ARTHROPLASTY ANTERIOR APPROACH (Left) Principal Problem:   OA (osteoarthritis) of hip Active Problems:   Obesity (BMI 30-39.9)   HTN (hypertension)   Mixed hyperlipidemia   Menopause   Postoperative anemia due to acute blood loss  Estimated body mass index is 37.28 kg/(m^2) as calculated from the following:   Height as of this encounter: 5\' 5"  (1.651 m).   Weight as of this encounter: 101.606 kg (224 lb). Advance diet Up with therapy Discharge to SNF  DVT Prophylaxis - Xarelto Weight Bearing As Tolerated left Leg Hemovac Pulled Begin Therapy   Arlee Muslim, PA-C Orthopaedic Surgery 09/30/2013, 7:26 AM

## 2013-09-30 NOTE — Evaluation (Signed)
Occupational Therapy Evaluation Patient Details Name: Alicia Schroeder MRN: [id number] DOB: [birth year] Today's Date: 09/30/2013    History of Present Illness 63 yo female s/p L THA-direct anterior 5/13.    Clinical Impression   Pt was admitted for the above surgery.  She lives alone and was mod I with adls prior to admission.  Pt will benefit from skilled OT to increase safety and independence with adls.  Pt is overall min A for adls at this time and goals in acute are for supervision level.      Follow Up Recommendations  SNF    Equipment Recommendations  3 in 1 bedside comode    Recommendations for Other Services       Precautions / Restrictions Precautions Precautions: Fall Restrictions Weight Bearing Restrictions: No LLE Weight Bearing: Weight bearing as tolerated      Mobility Bed Mobility   Bed Mobility: Supine to Sit     Supine to sit: Min assist     General bed mobility comments: assist for L LE  Transfers     Transfers: Sit to/from Stand Sit to Stand: Min guard;Min assist         General transfer comment: Min A from comfort height commode; min guard from bed, elevated    Balance                                            ADL Overall ADL's : Needs assistance/impaired     Grooming: Oral care;Standing;Supervision/safety   Upper Body Bathing: Set up;Sitting   Lower Body Bathing: Minimal assistance;Sit to/from stand   Upper Body Dressing : Set up;Sitting   Lower Body Dressing: Moderate assistance;Sit to/from stand   Toilet Transfer: Ambulation;RW;Min guard   Toileting- Water quality scientist and Hygiene: Min guard;Sit to/from stand       Functional mobility during ADLs: Min guard;Rolling walker General ADL Comments: ambulated to bathroom and completed adl.  Educated on AE--pt has 3 reachers at home.  Explained leg lifter but did not have one to show her.  Bed mobility is most painful activity for her     Vision                      Perception     Praxis      Pertinent Vitals/Pain 1/10 L hip sitting in chair, had gotten to 6/10 with bed mobility.  Premedicated and repositioned     Hand Dominance     Extremity/Trunk Assessment Upper Extremity Assessment Upper Extremity Assessment: Overall WFL for tasks assessed           Communication Communication Communication: No difficulties   Cognition Arousal/Alertness: Awake/alert Behavior During Therapy: WFL for tasks assessed/performed Overall Cognitive Status: Within Functional Limits for tasks assessed                     General Comments       Exercises       Shoulder Instructions      Home Living Family/patient expects to be discharged to:: Skilled nursing facility                                        Prior Functioning/Environment Level of Independence: Independent  OT Diagnosis: Generalized weakness   OT Problem List: Decreased strength;Decreased activity tolerance;Decreased knowledge of use of DME or AE;Pain   OT Treatment/Interventions: Self-care/ADL training;DME and/or AE instruction;Patient/family education    OT Goals(Current goals can be found in the care plan section) Acute Rehab OT Goals Patient Stated Goal: rehab to regain independence OT Goal Formulation: With patient Time For Goal Achievement: 10/07/13 Potential to Achieve Goals: Good ADL Goals Pt Will Perform Lower Body Bathing: with supervision;sit to/from stand;with adaptive equipment Pt Will Perform Lower Body Dressing: with supervision;with adaptive equipment;sit to/from stand Pt Will Transfer to Toilet: with supervision;bedside commode;ambulating  OT Frequency: Min 2X/week   Barriers to D/C:            Co-evaluation              End of Session    Activity Tolerance: Patient tolerated treatment well Patient left: in chair;with call bell/phone within reach   Time: 0867-6195 OT Time Calculation  (min): 40 min Charges:  OT General Charges $OT Visit: 1 Procedure OT Evaluation $Initial OT Evaluation Tier I: 1 Procedure OT Treatments $Self Care/Home Management : 23-37 mins G-Codes:    Lesle Chris 2013-10-19, 9:46 AM Lesle Chris, OTR/L [phone] 10/19/2013

## 2013-09-30 NOTE — Progress Notes (Addendum)
Clinical Social Work  CSW spoke with [employer] who continues to work on Ship broker for SNF stay. CSW faxed PT/OT notes. [place] # received: [phone]. CSW will continue to follow in order to assist with DC planning.  Sindy Messing, LCSW (Coverage for [employer])  Addendum: 1220 H&P, PT, OT notes all faxed to insurance as requested.

## 2013-10-01 ENCOUNTER — Encounter (HOSPITAL_COMMUNITY): Payer: Self-pay | Admitting: Orthopedic Surgery

## 2013-10-01 LAB — BASIC METABOLIC PANEL
BUN: 13 mg/dL (ref 6–23)
CO2: 26 mEq/L (ref 19–32)
Calcium: 10.1 mg/dL (ref 8.4–10.5)
Chloride: 103 mEq/L (ref 96–112)
Creatinine, Ser: 0.5 mg/dL (ref 0.50–1.10)
GFR calc non Af Amer: >90 mL/min (ref >90)
Glucose, Bld: 120 mg/dL — ABNORMAL HIGH (ref 70–99)
Potassium: 4.2 mEq/L (ref 3.7–5.3)
Sodium: 138 mEq/L (ref 137–147)

## 2013-10-01 LAB — CBC
HCT: 31.8 % — ABNORMAL LOW (ref 36.0–46.0)
Hemoglobin: 10.2 g/dL — ABNORMAL LOW (ref 12.0–15.0)
MCH: 28.8 pg (ref 26.0–34.0)
MCHC: 32.1 g/dL (ref 30.0–36.0)
MCV: 89.8 fL (ref 78.0–100.0)
Platelets: 245 10*3/uL (ref 150–400)
RBC: 3.54 MIL/uL — ABNORMAL LOW (ref 3.87–5.11)
RDW: 13.5 % (ref 11.5–15.5)
WBC: 10.6 10*3/uL — AB (ref 4.0–10.5)

## 2013-10-01 MED ORDER — TRAMADOL HCL 50 MG PO TABS: 50.0000 mg | ORAL_TABLET | Freq: Four times a day (QID) | ORAL | Status: DC | PRN

## 2013-10-01 MED ORDER — ACETAMINOPHEN 325 MG PO TABS: 650.0000 mg | ORAL_TABLET | Freq: Four times a day (QID) | ORAL | Status: DC | PRN

## 2013-10-01 MED ORDER — BISACODYL 10 MG RE SUPP: 10.0000 mg | Freq: Every day | RECTAL | Status: DC | PRN

## 2013-10-01 MED ORDER — DSS 100 MG PO CAPS: 100.0000 mg | ORAL_CAPSULE | Freq: Two times a day (BID) | ORAL | Status: DC

## 2013-10-01 MED ORDER — METHOCARBAMOL 500 MG PO TABS: 500.0000 mg | ORAL_TABLET | Freq: Four times a day (QID) | ORAL | Status: DC | PRN

## 2013-10-01 MED ORDER — OXYCODONE HCL 5 MG PO TABS: 5.0000 mg | ORAL_TABLET | ORAL | Status: DC | PRN

## 2013-10-01 MED ORDER — METOCLOPRAMIDE HCL 5 MG PO TABS: 5.0000 mg | ORAL_TABLET | Freq: Three times a day (TID) | ORAL | Status: DC | PRN

## 2013-10-01 MED ORDER — RIVAROXABAN 10 MG PO TABS: 10.0000 mg | ORAL_TABLET | Freq: Every day | ORAL | Status: DC

## 2013-10-01 MED ORDER — ONDANSETRON HCL 4 MG PO TABS: 4.0000 mg | ORAL_TABLET | Freq: Four times a day (QID) | ORAL | Status: DC | PRN

## 2013-10-01 MED ORDER — POLYETHYLENE GLYCOL 3350 17 G PO PACK: 17.0000 g | PACK | Freq: Every day | ORAL | Status: DC | PRN

## 2013-10-01 NOTE — Discharge Summary (Signed)
Physician Discharge Summary   Patient ID: Alicia Schroeder MRN: [id number] DOB/AGE: 1950-06-24 63 y.o.  Admit date: 09/29/2013 Discharge date: Tentative Date of Discharge - Friday 10-01-2013   Primary Diagnosis:  Osteoarthritis of the Left hip.  Admission Diagnoses:  Past Medical History  Diagnosis Date  . Hyperlipidemia   . Hypertension   . Arthritis      right greater the left hip  . Abnormal ECG   . Family history of premature coronary artery disease     Father with extensive coronary and carotid disease  . Family history of anesthesia complication     sisterin law disabled from anesthesia   . Premature ventricular contractions   . Dysrhythmia     pvc's   . Heart murmur     no evidence per patient   . Pneumonia     15 years ago approximately   . Cancer     basal cell carcinoma removed- 24 years ago    Discharge Diagnoses:   Principal Problem:   OA (osteoarthritis) of hip Active Problems:   Obesity (BMI 30-39.9)   HTN (hypertension)   Mixed hyperlipidemia   Menopause   Postoperative anemia due to acute blood loss  Estimated body mass index is 37.28 kg/(m^2) as calculated from the following:   Height as of this encounter: $RemoveBeforeD'5\' 5"'[id number]$  (1.651 m).   Weight as of this encounter: 101.606 kg (224 lb).  Procedure(s) (LRB): LEFT TOTAL HIP ARTHROPLASTY ANTERIOR APPROACH (Left)   Consults: None  HPI: Alicia Schroeder is a 63 y.o. female who has advanced end-  stage arthritis of his Left hip with progressively worsening pain and  dysfunction.The patient has failed nonoperative management and presents for  total hip arthroplasty.   Laboratory Data: Admission on 09/29/2013  Component Date Value Ref Range Status  . ABO/RH(D) 09/29/2013 A POS   Final  . Antibody Screen 09/29/2013 NEG   Final  . Sample Expiration 09/29/2013 10/02/2013   Final  . ABO/RH(D) 09/29/2013 A POS   Final  . WBC 09/30/2013 11.3* 4.0 - 10.5 K/uL Final  . RBC 09/30/2013 3.51* 3.87 - 5.11 MIL/uL Final  .  Hemoglobin 09/30/2013 10.3* 12.0 - 15.0 g/dL Final  . HCT 09/30/2013 31.5* 36.0 - 46.0 % Final  . MCV 09/30/2013 89.7  78.0 - 100.0 fL Final  . MCH 09/30/2013 29.3  26.0 - 34.0 pg Final  . MCHC 09/30/2013 32.7  30.0 - 36.0 g/dL Final  . RDW 09/30/2013 13.3  11.5 - 15.5 % Final  . Platelets 09/30/2013 262  150 - 400 K/uL Final  . Sodium 09/30/2013 140  137 - 147 mEq/L Final  . Potassium 09/30/2013 4.3  3.7 - 5.3 mEq/L Final  . Chloride 09/30/2013 105  96 - 112 mEq/L Final  . CO2 09/30/2013 24  19 - 32 mEq/L Final  . Glucose, Bld 09/30/2013 138* 70 - 99 mg/dL Final  . BUN 09/30/2013 12  6 - 23 mg/dL Final  . Creatinine, Ser 09/30/2013 0.52  0.50 - 1.10 mg/dL Final  . Calcium 09/30/2013 9.9  8.4 - 10.5 mg/dL Final  . GFR calc non Af Amer 09/30/2013 >90  >90 mL/min Final  . GFR calc Af Amer 09/30/2013 >90  >90 mL/min Final   Comment: (NOTE)                          The eGFR has been calculated using the CKD EPI equation.  This calculation has not been validated in all clinical situations.                          eGFR's persistently <90 mL/min signify possible Chronic Kidney                          Disease.  . WBC 10/01/2013 10.6* 4.0 - 10.5 K/uL Final  . RBC 10/01/2013 3.54* 3.87 - 5.11 MIL/uL Final  . Hemoglobin 10/01/2013 10.2* 12.0 - 15.0 g/dL Final  . HCT 10/01/2013 31.8* 36.0 - 46.0 % Final  . MCV 10/01/2013 89.8  78.0 - 100.0 fL Final  . MCH 10/01/2013 28.8  26.0 - 34.0 pg Final  . MCHC 10/01/2013 32.1  30.0 - 36.0 g/dL Final  . RDW 10/01/2013 13.5  11.5 - 15.5 % Final  . Platelets 10/01/2013 245  150 - 400 K/uL Final  . Sodium 10/01/2013 138  137 - 147 mEq/L Final  . Potassium 10/01/2013 4.2  3.7 - 5.3 mEq/L Final  . Chloride 10/01/2013 103  96 - 112 mEq/L Final  . CO2 10/01/2013 26  19 - 32 mEq/L Final  . Glucose, Bld 10/01/2013 120* 70 - 99 mg/dL Final  . BUN 10/01/2013 13  6 - 23 mg/dL Final  . Creatinine, Ser 10/01/2013 0.50  0.50 - 1.10 mg/dL  Final  . Calcium 10/01/2013 10.1  8.4 - 10.5 mg/dL Final  . GFR calc non Af Amer 10/01/2013 >90  >90 mL/min Final  . GFR calc Af Amer 10/01/2013 >90  >90 mL/min Final   Comment: (NOTE)                          The eGFR has been calculated using the CKD EPI equation.                          This calculation has not been validated in all clinical situations.                          eGFR's persistently <90 mL/min signify possible Chronic Kidney                          Disease.  Hospital Outpatient Visit on 09/22/2013  Component Date Value Ref Range Status  . Specimen Description 09/22/2013 NOSE   Final  . Special Requests 09/22/2013 NONE   Final  . Culture 09/22/2013    Final                   Value:NO STAPHYLOCOCCUS AUREUS ISOLATED                         Note: No MRSA Isolated                         Performed at [facility]  . Report Status 09/22/2013 09/25/2013 FINAL   Final  Hospital Outpatient Visit on 09/22/2013  Component Date Value Ref Range Status  . aPTT 09/22/2013 37  24 - 37 seconds Final   Comment:                                 IF  BASELINE aPTT IS ELEVATED,                          SUGGEST PATIENT RISK ASSESSMENT                          BE USED TO DETERMINE APPROPRIATE                          ANTICOAGULANT THERAPY.  . WBC 09/22/2013 7.4  4.0 - 10.5 K/uL Final  . RBC 09/22/2013 4.10  3.87 - 5.11 MIL/uL Final  . Hemoglobin 09/22/2013 12.2  12.0 - 15.0 g/dL Final  . HCT 77/42/4208 36.7  36.0 - 46.0 % Final  . MCV 09/22/2013 89.5  78.0 - 100.0 fL Final  . MCH 09/22/2013 29.8  26.0 - 34.0 pg Final  . MCHC 09/22/2013 33.2  30.0 - 36.0 g/dL Final  . RDW 29/90/7930 13.5  11.5 - 15.5 % Final  . Platelets 09/22/2013 317  150 - 400 K/uL Final  . Sodium 09/22/2013 141  137 - 147 mEq/L Final  . Potassium 09/22/2013 4.9  3.7 - 5.3 mEq/L Final  . Chloride 09/22/2013 103  96 - 112 mEq/L Final  . CO2 09/22/2013 28  19 - 32 mEq/L Final  . Glucose, Bld 09/22/2013 98  70  - 99 mg/dL Final  . BUN 89/78/1752 21  6 - 23 mg/dL Final  . Creatinine, Ser 09/22/2013 0.56  0.50 - 1.10 mg/dL Final  . Calcium 03/65/0554 10.7* 8.4 - 10.5 mg/dL Final  . Total Protein 09/22/2013 7.2  6.0 - 8.3 g/dL Final  . Albumin 82/95/2156 4.3  3.5 - 5.2 g/dL Final  . AST 41/47/2992 16  0 - 37 U/L Final  . ALT 09/22/2013 15  0 - 35 U/L Final  . Alkaline Phosphatase 09/22/2013 99  39 - 117 U/L Final  . Total Bilirubin 09/22/2013 0.4  0.3 - 1.2 mg/dL Final  . GFR calc non Af Amer 09/22/2013 >90  >90 mL/min Final  . GFR calc Af Amer 09/22/2013 >90  >90 mL/min Final   Comment: (NOTE)                          The eGFR has been calculated using the CKD EPI equation.                          This calculation has not been validated in all clinical situations.                          eGFR's persistently <90 mL/min signify possible Chronic Kidney                          Disease.  Marland Kitchen Prothrombin Time 09/22/2013 12.5  11.6 - 15.2 seconds Final  . INR 09/22/2013 0.95  0.00 - 1.49 Final  . Color, Urine 09/22/2013 YELLOW  YELLOW Final  . APPearance 09/22/2013 CLEAR  CLEAR Final  . Specific Gravity, Urine 09/22/2013 1.006  1.005 - 1.030 Final  . pH 09/22/2013 5.5  5.0 - 8.0 Final  . Glucose, UA 09/22/2013 NEGATIVE  NEGATIVE mg/dL Final  . Hgb urine dipstick 09/22/2013 NEGATIVE  NEGATIVE Final  . Bilirubin Urine 09/22/2013 NEGATIVE  NEGATIVE Final  . Ketones, ur  09/22/2013 NEGATIVE  NEGATIVE mg/dL Final  . Protein, ur 09/22/2013 NEGATIVE  NEGATIVE mg/dL Final  . Urobilinogen, UA 09/22/2013 0.2  0.0 - 1.0 mg/dL Final  . Nitrite 09/22/2013 NEGATIVE  NEGATIVE Final  . Leukocytes, UA 09/22/2013 TRACE* NEGATIVE Final  . MRSA, PCR 09/22/2013 INVALID RESULTS, SPECIMEN SENT FOR CULTURE* NEGATIVE Final   Comment: RESULT CALLED TO, READ BACK BY AND VERIFIED WITH:                          PHILLIPS,K AT 1703 ON [id number] BY POTEAT,S  . Staphylococcus aureus 09/22/2013 INVALID RESULTS, SPECIMEN SENT FOR  CULTURE* NEGATIVE Final   Comment: RESULT CALLED TO, READ BACK BY AND VERIFIED WITH:                          PHILLIPS,K AT 1703 ON [id number] BY POTEAT,S                                                          The Xpert SA Assay (FDA                          approved for NASAL specimens                          in patients over 73 years of age),                          is one component of                          a comprehensive surveillance                          program.  Test performance has                          been validated by [employer] for patients greater                          than or equal to 2 year old.                          It is not intended                          to diagnose infection nor to                          guide or monitor treatment.  . WBC, UA 09/22/2013 0-2  <3 WBC/hpf Final     X-Rays:Dg Chest 2 View  09/22/2013   CLINICAL DATA:  hx of hypertension preop left hip  EXAM: CHEST  2 VIEW  COMPARISON:  None.  FINDINGS: Cardiac silhouette mildly enlarged. Both lungs are clear. The visualized  skeletal structures are unremarkable.  IMPRESSION: No active cardiopulmonary disease.   Electronically Signed   By: Margaree Mackintosh M.D.   On: 09/22/2013 15:15   Dg Hip Complete Left  09/22/2013   CLINICAL DATA:  oa left hip  EXAM: LEFT HIP - COMPLETE 2+ VIEW  COMPARISON:  None.  FINDINGS: Severe joint space narrowing superior and centrally. There is periarticular sclerosis, flattening of the femoral head, subchondral cyst formation and superior lateral subluxation of the femoral head. No evidence of fracture nor dislocation. Degenerative changes appreciated within the lower lumbar spine.  IMPRESSION: Advanced osteoarthritic changes left hip.   Electronically Signed   By: Margaree Mackintosh M.D.   On: 09/22/2013 15:15   Dg Pelvis Portable  09/29/2013   CLINICAL DATA:  Status post left hip replacement  EXAM: PORTABLE PELVIS 1-2 VIEWS  COMPARISON:  DG  HIP COMPLETE*L* dated 09/22/2013  FINDINGS: The patient has undergone a left total hip joint prosthesis placement for severe degenerative change. Radiographic positioning of the prosthetic components is good. A surgical drain line is present.  IMPRESSION: There is no evidence of postprocedure complication following placement of the left hip joint prosthesis.   Electronically Signed   By: David  Martinique   On: 09/29/2013 11:29   Dg C-arm 1-60 Min-no Report  09/29/2013   CLINICAL DATA: left anterior hip   C-ARM 1-60 MINUTES  Fluoroscopy was utilized by the requesting physician.  No radiographic  interpretation.     EKG: Orders placed in visit on 07/20/13  . EKG 12-LEAD     Hospital Course: Patient was admitted to [facility] and taken to the OR and underwent the above state procedure without complications.  Patient tolerated the procedure well and was later transferred to the recovery room and then to the orthopaedic floor for postoperative care.  They were given PO and IV analgesics for pain control following their surgery.  They were given 24 hours of postoperative antibiotics of  Anti-infectives   Start     Dose/Rate Route Frequency Ordered Stop   09/29/13 1400  ceFAZolin (ANCEF) IVPB 2 g/50 mL premix     2 g 100 mL/hr over 30 Minutes Intravenous Every 6 hours 09/29/13 1205 09/29/13 2106   09/29/13 0614  ceFAZolin (ANCEF) IVPB 2 g/50 mL premix     2 g 100 mL/hr over 30 Minutes Intravenous On call to O.R. 09/29/13 [id number] 09/29/13 0835     and started on DVT prophylaxis in the form of Xarelto.   PT and OT were ordered for total hip protocol.  The patient was allowed to be WBAT with therapy. Discharge planning was consulted to help with postop disposition and equipment needs.  Patient had a good night on the evening of surgery and doing well the next morning.  They started to get up OOB with therapy on day one.  Hemovac drain was pulled without difficulty.    Continued to work with therapy into  day two.  Dressing was changed on day two and the incision was healing well.  Patient was seen in rounds on day two and was doing well.  It was felt that as long as the patient continued to improve, it was felt that they would be ready to transfer later that day, Friday 10-01-2013 if insurance approved the transfer.  If not, then she will stay another day in house to receive more therapy and then transfer the following day, Saturday 10-02-2013.  If she stays on Friday, the patient will  evaluated by the weekend coverage staff and will discharge if doing well.  This summary was prepared in anticipation of the the patient's transfer either Friday afternoon or over the weekend.   Diet: Cardiac diet Activity:WBAT Follow-up:in 2 weeks Disposition - Skilled nursing facility - [facility] Discharged Condition: Pending at time of summary, Transfer on Friday if get insurance approval. If not, then plan on Saturday if doing well on weekend rounds.   Discharge Instructions   Call MD / Call 911    Complete by:  As directed   If you experience chest pain or shortness of breath, CALL 911 and be transported to the hospital emergency room.  If you develope a fever above 101 F, pus (white drainage) or increased drainage or redness at the wound, or calf pain, call your surgeon's office.     Change dressing    Complete by:  As directed   You may change your dressing dressing daily with sterile 4 x 4 inch gauze dressing and paper tape.  Do not submerge the incision under water.     Constipation Prevention    Complete by:  As directed   Drink plenty of fluids.  Prune juice may be helpful.  You may use a stool softener, such as Colace (over the counter) 100 mg twice a day.  Use MiraLax (over the counter) for constipation as needed.     Diet general    Complete by:  As directed      Discharge instructions    Complete by:  As directed   Pick up stool softner and laxative for home. Do not submerge incision under  water. May shower. Continue to use ice for pain and swelling from surgery.  Total Hip Protocol.  Take Xarelto for two and a half more weeks, then discontinue Xarelto. Once the patient has completed the Xarelto, they may resume the 81 mg Aspirin.  When discharged from the skilled rehab facility, please have the facility set up the patient's [place] prior to being released.  Also provide the patient with their medications at time of release from the facility to include their pain medication, the muscle relaxants, and their blood thinner medication.  If the patient is still at the rehab facility at time of follow up appointment, please also assist the patient in arranging follow up appointment in our office and any transportation needs.     Do not sit on low chairs, stoools or toilet seats, as it may be difficult to get up from low surfaces    Complete by:  As directed      Driving restrictions    Complete by:  As directed   No driving until released by the physician.     Increase activity slowly as tolerated    Complete by:  As directed      Lifting restrictions    Complete by:  As directed   No lifting until released by the physician.     Patient may shower    Complete by:  As directed   You may shower without a dressing once there is no drainage.  Do not wash over the wound.  If drainage remains, do not shower until drainage stops.     TED hose    Complete by:  As directed   Use stockings (TED hose) for 3 weeks on both leg(s).  You may remove them at night for sleeping.     Weight bearing as tolerated  Complete by:  As directed             Medication List    STOP taking these medications       aspirin EC 81 MG tablet     celecoxib 200 MG capsule  Commonly known as:  CELEBREX     Coenzyme Q10 50 MG Caps     CoQ-10 100 MG Caps     GLUCOSAMINE-CHONDROITIN-MSM PO     Vitamin D3 2000 UNITS Tabs      TAKE these medications       acebutolol 200 MG  capsule  Commonly known as:  SECTRAL  Take 1 capsule (200 mg total) by mouth 2 (two) times daily.     acetaminophen 325 MG tablet  Commonly known as:  TYLENOL  Take 2 tablets (650 mg total) by mouth every 6 (six) hours as needed for mild pain (or Fever >/= 101).     atorvastatin 20 MG tablet  Commonly known as:  LIPITOR  Take 20 mg by mouth daily. Patient takes in pm     bisacodyl 10 MG suppository  Commonly known as:  DULCOLAX  Place 1 suppository (10 mg total) rectally daily as needed for moderate constipation.     cetirizine 10 MG tablet  Commonly known as:  ZYRTEC  Take 10 mg by mouth daily.     DSS 100 MG Caps  Take 100 mg by mouth 2 (two) times daily.     lisinopril 20 MG tablet  Commonly known as:  PRINIVIL,ZESTRIL  Take 20 mg by mouth every evening.     methocarbamol 500 MG tablet  Commonly known as:  ROBAXIN  Take 1 tablet (500 mg total) by mouth every 6 (six) hours as needed for muscle spasms.     metoCLOPramide 5 MG tablet  Commonly known as:  REGLAN  Take 1-2 tablets (5-10 mg total) by mouth every 8 (eight) hours as needed for nausea (if ondansetron (ZOFRAN) ineffective.).     ondansetron 4 MG tablet  Commonly known as:  ZOFRAN  Take 1 tablet (4 mg total) by mouth every 6 (six) hours as needed for nausea.     oxyCODONE 5 MG immediate release tablet  Commonly known as:  Oxy IR/ROXICODONE  Take 1-2 tablets (5-10 mg total) by mouth every 3 (three) hours as needed for moderate pain, severe pain or breakthrough pain.     polyethylene glycol packet  Commonly known as:  MIRALAX / GLYCOLAX  Take 17 g by mouth daily as needed for mild constipation.     polyvinyl alcohol 1.4 % ophthalmic solution  Commonly known as:  LIQUIFILM TEARS  Place 1 drop into both eyes as needed for dry eyes.     rivaroxaban 10 MG Tabs tablet  Commonly known as:  XARELTO  - Take 1 tablet (10 mg total) by mouth daily with breakfast. Take Xarelto for two and a half more weeks, then  discontinue Xarelto.  - Once the patient has completed the Xarelto, they may resume the 81 mg Aspirin.     traMADol 50 MG tablet  Commonly known as:  ULTRAM  Take 1-2 tablets (50-100 mg total) by mouth every 6 (six) hours as needed (mild to moderate pain).           Follow-up Information   Follow up with Gearlean Alf, MD. Schedule an appointment as soon as possible for a visit in 2 weeks.   Specialty:  Orthopedic Surgery   Contact information:  [address] [place] [address] [phone]       Signed: Arlee Muslim, PA-C Orthopaedic Surgery 10/01/2013, 8:17 AM

## 2013-10-01 NOTE — Progress Notes (Signed)
Clinical Social Work Department CLINICAL SOCIAL WORK PLACEMENT NOTE 10/01/2013  Patient:  Alicia Schroeder, Alicia Schroeder  Account Number:  [id number] [place] date:  09/29/2013  Clinical Social Worker:  Werner Lean, LCSW  Date/time:  09/29/2013 02:57 PM  Clinical Social Work is seeking post-discharge placement for this patient at the following level of care:   SKILLED NURSING   (*CSW will update this form in Epic as items are completed)     Patient/family provided with [place] Department of Clinical Social Work's list of facilities offering this level of care within the geographic area requested by the patient (or if unable, by the patient's family).  09/29/2013  Patient/family informed of their freedom to choose among providers that offer the needed level of care, that participate in Medicare, Medicaid or managed care program needed by the patient, have an available bed and are willing to accept the patient.    Patient/family informed of [facility]' ownership interest in [facility], as well as of the fact that they are under no obligation to receive care at this facility.  PASARR submitted to EDS on 09/29/2013 PASARR number received from [employer] on   FL2 transmitted to all facilities in geographic area requested by pt/family on  09/29/2013 FL2 transmitted to all facilities within larger geographic area on   Patient informed that his/her managed care company has contracts with or will negotiate with  certain facilities, including the following:     Patient/family informed of bed offers received:  09/29/2013 Patient chooses bed at [facility] Physician recommends and patient chooses bed at    Patient to be transferred to [facility] on  10/01/2013 Patient to be transferred to facility by family  The following physician request were entered in Epic:   Additional Comments:  [employer] has provided prior authorization for SNF placement.   Werner Lean  LCSW [phone]

## 2013-10-01 NOTE — Progress Notes (Signed)
Physical Therapy Treatment Patient Details Name: Alicia Schroeder MRN: [id number] DOB: 05/15/1951 Today's Date: 10/01/2013    History of Present Illness 63 yo female s/p L THA-direct anterior 5/13.     PT Comments    Progressing well with mobility. Plan is for possible d/c to SNF later today.   Follow Up Recommendations  SNF     Equipment Recommendations  Rolling walker with 5" wheels    Recommendations for Other Services OT consult     Precautions / Restrictions Precautions Precautions: Fall Restrictions Weight Bearing Restrictions: No LLE Weight Bearing: Weight bearing as tolerated    Mobility  Bed Mobility Overal bed mobility: Needs Assistance Bed Mobility: Supine to Sit;Sit to Supine     Supine to sit: Min guard Sit to supine: Min assist   General bed mobility comments: assist for L LE  Transfers Overall transfer level: Needs assistance Equipment used: Rolling walker (2 wheeled) Transfers: Sit to/from Stand Sit to Stand: Min guard;Min assist         General transfer comment: Min assist from low bed surface. Min guard from toilet with use of grabbar.   Ambulation/Gait Ambulation/Gait assistance: Min guard Ambulation Distance (Feet): 300 Feet Assistive device: Rolling walker (2 wheeled) Gait Pattern/deviations: Step-through pattern;Antalgic;Decreased stride length     General Gait Details: Noted L hip circumduction gait pattern-VCs for pt to try to increase hip, knee flexion a little more to work towards normalizing gait pattern. close guard for safety   Stairs            Wheelchair Mobility    Modified Rankin (Stroke Patients Only)       Balance                                    Cognition Arousal/Alertness: Awake/alert Behavior During Therapy: WFL for tasks assessed/performed Overall Cognitive Status: Within Functional Limits for tasks assessed                      Exercises Total Joint Exercises Hip  ABduction/ADduction: AROM;Left;10 reps;Standing Knee Flexion: AROM;Left;10 reps;Standing Marching in Standing: AROM;Both;10 reps;Standing General Exercises - Lower Extremity Heel Raises: AROM;10 reps;Both;Standing    General Comments        Pertinent Vitals/Pain 2/10 L hip area. Ice applied end of session    Home Living                      Prior Function            PT Goals (current goals can now be found in the care plan section) Progress towards PT goals: Progressing toward goals    Frequency  7X/week    PT Plan Current plan remains appropriate    Co-evaluation             End of Session   Activity Tolerance: Patient tolerated treatment well Patient left: in bed;with call bell/phone within reach     Time: 1031-1058 PT Time Calculation (min): 27 min  Charges:  $Gait Training: 8-22 mins $Therapeutic Exercise: 8-22 mins                    G Codes:      Weston Anna, MPT Pager: [phone]

## 2013-10-01 NOTE — Discharge Instructions (Addendum)
°Dr. Frank Aluisio °Total Joint Specialist °[facility] °[address] °, [place] [address] °[phone] ° ° ° °ANTERIOR APPROACH TOTAL HIP REPLACEMENT POSTOPERATIVE DIRECTIONS ° ° °Hip Rehabilitation, Guidelines Following Surgery  °The results of a hip operation are greatly improved after range of motion and muscle strengthening exercises. Follow all safety measures which are given to protect your hip. If any of these exercises cause increased pain or swelling in your joint, decrease the amount until you are comfortable again. Then slowly increase the exercises. Call your caregiver if you have problems or questions.  °HOME CARE INSTRUCTIONS  °Most of the following instructions are designed to prevent the dislocation of your new hip.  °Remove items at home which could result in a fall. This includes throw rugs or furniture in walking pathways.  °Continue medications as instructed at time of discharge. °· You may have some home medications which will be placed on hold until you complete the course of blood thinner medication. °· You may start showering once you are discharged home but do not submerge the incision under water. Just pat the incision dry and apply a dry gauze dressing on daily. °Do not put on socks or shoes without following the instructions of your caregivers.  °Sit on high chairs which makes it easier to stand.  °Sit on chairs with arms. Use the chair arms to help push yourself up when arising.  °Keep your leg on the side of the operation out in front of you when standing up.  °Arrange for the use of a toilet seat elevator so you are not sitting low.   °· Walk with walker as instructed.  °You may resume a sexual relationship in one month or when given the OK by your caregiver.  °Use walker as long as suggested by your caregivers.  °You may put full weight on your legs and walk as much as is comfortable. °Avoid periods of inactivity such as sitting longer than an hour  when not asleep. This helps prevent blood clots.  °You may return to work once you are cleared by your surgeon.  °Do not drive a car for 6 weeks or until released by your surgeon.  °Do not drive while taking narcotics.  °Wear elastic stockings for three weeks following surgery during the day but you may remove then at night.  °Make sure you keep all of your appointments after your operation with all of your doctors and caregivers. You should call the office at the above phone number and make an appointment for approximately two weeks after the date of your surgery. °Change the dressing daily and reapply a dry dressing each time. °Please pick up a stool softener and laxative for home use as long as you are requiring pain medications. °· Continue to use ice on the hip for pain and swelling from surgery. You may notice swelling that will progress down to the foot and ankle.  This is normal after  surgery.  Elevate the leg when you are not up walking on it.   °It is important for you to complete the blood thinner medication as prescribed by your doctor. °· Continue to use the breathing machine which will help keep your temperature down.  It is common for your temperature to cycle up and down following surgery, especially at night when you are not up moving around and exerting yourself.  The breathing machine keeps your lungs expanded and your temperature down. ° °RANGE OF MOTION AND STRENGTHENING EXERCISES  °  These exercises are designed to help you keep full movement of your hip joint. Follow your caregiver's or physical therapist's instructions. Perform all exercises about fifteen times, three times per day or as directed. Exercise both hips, even if you have had only one joint replacement. These exercises can be done on a training (exercise) mat, on the floor, on a table or on a bed. Use whatever works the best and is most comfortable for you. Use music or television while you are exercising so that the exercises are  a pleasant break in your day. This will make your life better with the exercises acting as a break in routine you can look forward to.  Lying on your back, slowly slide your foot toward your buttocks, raising your knee up off the floor. Then slowly slide your foot back down until your leg is straight again.  Lying on your back spread your legs as far apart as you can without causing discomfort.  Lying on your side, raise your upper leg and foot straight up from the floor as far as is comfortable. Slowly lower the leg and repeat.  Lying on your back, tighten up the muscle in the front of your thigh (quadriceps muscles). You can do this by keeping your leg straight and trying to raise your heel off the floor. This helps strengthen the largest muscle supporting your knee.  Lying on your back, tighten up the muscles of your buttocks both with the legs straight and with the knee bent at a comfortable angle while keeping your heel on the floor.   SKILLED REHAB INSTRUCTIONS: If the patient is transferred to a skilled rehab facility following release from the hospital, a list of the current medications will be sent to the facility for the patient to continue.  When discharged from the skilled rehab facility, please have the facility set up the patient's [place] prior to being released. Also, the skilled facility will be responsible for providing the patient with their medications at time of release from the facility to include their pain medication, the muscle relaxants, and their blood thinner medication. If the patient is still at the rehab facility at time of the two week follow up appointment, the skilled rehab facility will also need to assist the patient in arranging follow up appointment in our office and any transportation needs.  MAKE SURE YOU:  Understand these instructions.  Will watch your condition.  Will get help right away if you are not doing well or get worse.  Pick up  stool softner and laxative for home. Do not submerge incision under water. May shower. Continue to use ice for pain and swelling from surgery. Total Hip Protocol.  Take Xarelto for two and a half more weeks, then discontinue Xarelto. Once the patient has completed the Xarelto, they may resume the 81 mg Aspirin.  When discharged from the skilled rehab facility, please have the facility set up the patient's [place] prior to being released.  Also provide the patient with their medications at time of release from the facility to include their pain medication, the muscle relaxants, and their blood thinner medication.  If the patient is still at the rehab facility at time of follow up appointment, please also assist the patient in arranging follow up appointment in our office and any transportation needs.  Information on my medicine - XARELTO (Rivaroxaban)  This medication education was reviewed with me or my healthcare representative as part of  my discharge preparation.  The pharmacist that spoke with me during my hospital stay was:  Julio Sicks, [facility]  Why was Xarelto prescribed for you? Xarelto was prescribed for you to reduce the risk of blood clots forming after orthopedic surgery. The medical term for these abnormal blood clots is venous thromboembolism (VTE).  What do you need to know about xarelto ? Take your Xarelto ONCE DAILY at the same time every day. You may take it either with or without food.  If you have difficulty swallowing the tablet whole, you may crush it and mix in applesauce just prior to taking your dose.  Take Xarelto exactly as prescribed by your doctor and DO NOT stop taking Xarelto without talking to the doctor who prescribed the medication.  Stopping without other VTE prevention medication to take the place of Xarelto may increase your risk of developing a clot.  After discharge, you should have regular check-up appointments with your  healthcare provider that is prescribing your Xarelto.    What do you do if you miss a dose? If you miss a dose, take it as soon as you remember on the same day then continue your regularly scheduled once daily regimen the next day. Do not take two doses of Xarelto on the same day.   Important Safety Information A possible side effect of Xarelto is bleeding. You should call your healthcare provider right away if you experience any of the following:   Bleeding from an injury or your nose that does not stop.   Unusual colored urine (red or dark brown) or unusual colored stools (red or black).   Unusual bruising for unknown reasons.   A serious fall or if you hit your head (even if there is no bleeding).  Some medicines may interact with Xarelto and might increase your risk of bleeding while on Xarelto. To help avoid this, consult your healthcare provider or pharmacist prior to using any new prescription or non-prescription medications, including herbals, vitamins, non-steroidal anti-inflammatory drugs (NSAIDs) and supplements.  This website has more information on Xarelto: [email or web address]

## 2013-10-01 NOTE — Progress Notes (Addendum)
   Subjective: 2 Days Post-Op Procedure(s) (LRB): LEFT TOTAL HIP ARTHROPLASTY ANTERIOR APPROACH (Left) Patient reports pain as mild.   Patient seen in rounds with Dr. Wynelle Link. Patient is well, but has had some minor complaints of pain in the hip, requiring pain medications Will continue therapy today.  Plan is to go Skilled nursing facility after hospital stay.  Objective: Vital signs in last 24 hours: Temp:  [98.7 F (37.1 C)-99 F (37.2 C)] 98.7 F (37.1 C) (05/15 0543) Pulse Rate:  [85-91] 91 (05/15 0543) Resp:  [18-19] 18 (05/15 0543) BP: (135-137)/(82-83) 137/83 mmHg (05/15 0543) SpO2:  [97 %] 97 % (05/15 0543)  Intake/Output from previous day:  Intake/Output Summary (Last 24 hours) at 10/01/13 0724 Last data filed at 10/01/13 0544  Gross per 24 hour  Intake    480 ml  Output    650 ml  Net   -170 ml     Labs:  Recent Labs  09/30/13 0422 10/01/13 0400  HGB 10.3* 10.2*    Recent Labs  09/30/13 0422 10/01/13 0400  WBC 11.3* 10.6*  RBC 3.51* 3.54*  HCT 31.5* 31.8*  PLT 262 245    Recent Labs  09/30/13 0422 10/01/13 0400  NA 140 138  K 4.3 4.2  CL 105 103  CO2 24 26  BUN 12 13  CREATININE 0.52 0.50  GLUCOSE 138* 120*  CALCIUM 9.9 10.1   No results found for this basename: LABPT, INR,  in the last 72 hours  EXAM General - Patient is Alert, Appropriate and Oriented Extremity - Neurovascular intact Sensation intact distally Dressing - dressing C/D/I Incision - healing well, no drainage Motor Function - intact, moving foot and toes well on exam.   Past Medical History  Diagnosis Date  . Hyperlipidemia   . Hypertension   . Arthritis      right greater the left hip  . Abnormal ECG   . Family history of premature coronary artery disease     Father with extensive coronary and carotid disease  . Family history of anesthesia complication     sisterin law disabled from anesthesia   . Premature ventricular contractions   . Dysrhythmia    pvc's   . Heart murmur     no evidence per patient   . Pneumonia     15 years ago approximately   . Cancer     basal cell carcinoma removed- 24 years ago     Assessment/Plan: 2 Days Post-Op Procedure(s) (LRB): LEFT TOTAL HIP ARTHROPLASTY ANTERIOR APPROACH (Left) Principal Problem:   OA (osteoarthritis) of hip Active Problems:   Obesity (BMI 30-39.9)   HTN (hypertension)   Mixed hyperlipidemia   Menopause   Postoperative anemia due to acute blood loss  Estimated body mass index is 37.28 kg/(m^2) as calculated from the following:   Height as of this encounter: 5\' 5"  (1.651 m).   Weight as of this encounter: 101.606 kg (224 lb). Up with therapy Plan for discharge tomorrow Discharge to SNF  DVT Prophylaxis - Xarelto Weight Bearing As Tolerated left Leg  Addendum - Possibility for transfer later today if approved by insurance.  If not, the patient will remain in house to receive more therapy and then probable transfer on Saturday 10/02/2013.  Arlee Muslim, PA-C Orthopaedic Surgery 10/01/2013, 7:24 AM

## 2013-12-02 NOTE — Telephone Encounter (Signed)
Encounter complete. 

## 2014-04-18 ENCOUNTER — Ambulatory Visit (INDEPENDENT_AMBULATORY_CARE_PROVIDER_SITE_OTHER): Payer: BC Managed Care – PPO | Admitting: Cardiology

## 2014-04-18 ENCOUNTER — Encounter: Payer: Self-pay | Admitting: Cardiology

## 2014-04-18 VITALS — BP 140/92 | HR 68 | Ht 66.0 in | Wt 232.2 lb

## 2014-04-18 DIAGNOSIS — I10 Essential (primary) hypertension: Secondary | ICD-10-CM

## 2014-04-18 DIAGNOSIS — E782 Mixed hyperlipidemia: Secondary | ICD-10-CM

## 2014-04-18 DIAGNOSIS — E669 Obesity, unspecified: Secondary | ICD-10-CM

## 2014-04-18 DIAGNOSIS — I493 Ventricular premature depolarization: Secondary | ICD-10-CM

## 2014-04-18 NOTE — Patient Instructions (Signed)
No change with medications  Your physician wants you to follow-up in 12 months Dr Ellyn Hack.  You will receive a reminder letter in the mail two months in advance. If you don't receive a letter, please call our office to schedule the follow-up appointment.

## 2014-04-20 ENCOUNTER — Encounter: Payer: Self-pay | Admitting: Cardiology

## 2014-04-20 NOTE — Assessment & Plan Note (Signed)
On statin. Monitored by PCP. 

## 2014-04-20 NOTE — Progress Notes (Signed)
since   PATIENT: Alicia Schroeder MRN: [id number] DOB: [birth year] PCP: Lilian Coma, MD  Clinic Note: Chief Complaint  Patient presents with  . Follow-up    6 mth visit. pt denies chest pain, swelling, and sob  . Palpitations    Documented PVCs   HPI: [name] is a 63 y.o. female with a PMH below who presents today for followup of hypertension, hyperlipidemia cardiac risk factors. I first met her when I was doing cardiovascular risk assessment for a upcoming surgery. She has a a significant family history of CAD -- (father w/ initial onset of disease was in his 60s, he had carotid endarterectomy as well as 3 vessel CABG plus bowel replacement. He eventually developed ischemic cardiomyopathy). He has a history of PVCs.her EKG performed during a clinic visit earlier this year demonstrated PVCs and trigeminy pattern.  Interval History: [name] continues to be doing fine overall from a cardiac standpoint.  She is trying to be a little more active with trying to exercise. She is fully recovered from her left hip surgery and is now able to walk. She took to heart the suggestions about caffeine worsening her palpitations and has stopped caffeine altogether about a month ago. She's not had a sense of any palpitations, really since starting the acebutolol, but most notably when she finally stopped caffeine.. Once a while she'll feel tinge or tingle in her chest that lasts a few seconds but it's better with a burp.          Otherwise from a cardiac standpoint she's been relatively asymptomatic with no resting or exertional dyspnea beyond the huffing & puffing with walking.  No chest pain with rest or exertion No PND, orthopnea or edema. No palpitations, lightheadedness, dizziness, weakness or syncope/near syncope. No TIA/amaurosis fugax symptoms.  Past Medical History  Diagnosis Date  . Hyperlipidemia   . Hypertension   . Arthritis      right greater the left hip  . Abnormal ECG   . Family  history of premature coronary artery disease     Father with extensive coronary and carotid disease  . Family history of anesthesia complication     sisterin law disabled from anesthesia   . Premature ventricular contractions   . Dysrhythmia     has soft Systolic Ejection Murmur  . Aortic valve sclerosis     no evidence per patient   . Pneumonia     15 years ago approximately   . Cancer     basal cell carcinoma removed- 24 years ago     Prior Cardiac Evaluation and Past Surgical History: Past Surgical History  Procedure Laterality Date  . Transthoracic echocardiogram  08/05/2013    Normal LV size and function. EF 60-65%. No regional WMA; grade 1 diastolic dysfunction with mild LA dilation; no significant valvular lesions -- mild aortic valve calcification/sclerosis (could explain a soft systolic murmur)  . Nm lexiscan myoview ltd  08/03/2013    Normal study. No ischemia or infarction. Normal wall motion. EF 71%  . Total hip arthroplasty Left 09/29/2013    Procedure: LEFT TOTAL HIP ARTHROPLASTY ANTERIOR APPROACH;  Surgeon: Gearlean Alf, MD;  Location: [facility];  Service: Orthopedics;  Laterality: Left;   No Known Allergies  MEDICATION REVIEWED IN EPIC -- no changes SOCIAL & FAMILY HISTORIES REVIEWED IN EPIC - No change.  ROS: A comprehensive Review of Systems - was performed. Review of Systems  Constitutional: Negative for malaise/fatigue.  Respiratory: Negative for cough and shortness of  breath.   Cardiovascular: Negative for chest pain and palpitations.  Gastrointestinal: Negative for blood in stool.  Genitourinary: Negative for hematuria.  Musculoskeletal: Positive for joint pain.       Hip pain is much better.  Neurological: Positive for headaches. Negative for dizziness and seizures.  Endo/Heme/Allergies: Does not bruise/bleed easily.  Psychiatric/Behavioral: Negative for depression and memory loss. The patient is not nervous/anxious and does not have insomnia.   All  other systems reviewed and are negative.     Negative except Significant bilateral hip and back pain worse in the right side & recent fall related bruising & aches/pains. Otherwise essentially negative. She feels tired and lazy because she is not be able to do much. Takes melatonin to help her sleep.  PHYSICAL EXAM BP 140/92 mmHg  Pulse 68  Ht $R'5\' 6"'dp$  (1.676 m)  Wt 232 lb 3.2 oz (105.325 kg)  BMI 37.50 kg/m2 General appearance: alert, cooperative, appears stated age, no distress, moderately obese and Well-nourished and well-groomed. Pleasant mood and affect. Answers questions appropriately HEENT: [place]/AT, EOMI, MMM, anicteric sclera Neck: Supple, no adenopathy, carotid bruit, or  JVD, Lungs:CTAB, normal percussion bilaterally and Nonlabored, and good air movement Heart: RRR with frequent ectopy. Normal S1-S2. Soft 1/6 SEM @ RUSB; no other R/G. Nondisplaced PMI. Abdomen: soft,NT/ND/NABS; no masses,  no organomegaly and mild truncal obesity Extremities: No C/C/E; no rash or lesions.; Pulses: 2+ and symmetric Neurologic: Alert and oriented X 3, normal strength and tone.   BPP:HKFEXMDYJ today: NSR 68 bpm, RSR'/ Inc RBBB - [place] stable EKG. Since previous EKG no further PVCs  Recent Labs: None available  ASSESSMENT / PLAN: 63 year old woman with right risk factors including hypertension, hyperlipidemia, obesity and family history of premature CAD. She is doing very well, stable from a cardiac standpoint. Palpitations/PVCs essentially resolved with acebutolol in addition to reducing caffeine. Blood pressure remains stable.  PVC's (premature ventricular contractions) Likely nonischemic as she had a normal echo and Myoview. Congratulated her on backing off caffeine. We'll continue acebutolol for now for general cardiovascular protection.  Essential hypertension Upper limit normal of blood pressure. I think with the current dose of lisinopril with acebutolol this is a reasonable target.  Otherwise we need to potentially add another blood pressure medication.  Mixed hyperlipidemia On statin. Monitored byPCP.  Obesity (BMI 30-39.9) Encouraged her to continue with her exercises and staying active. Diet modifications discussed as well.   Orders Placed This Encounter  Procedures  . EKG 12-Lead   Followup: one year  DAVID W. Ellyn Hack, M.D., M.S. Interventional Cardiolgy [facility]

## 2014-04-20 NOTE — Assessment & Plan Note (Signed)
Encouraged her to continue with her exercises and staying active. Diet modifications discussed as well.

## 2014-04-20 NOTE — Assessment & Plan Note (Signed)
Upper limit normal of blood pressure. I think with the current dose of lisinopril with acebutolol this is a reasonable target. Otherwise we need to potentially add another blood pressure medication.

## 2014-04-20 NOTE — Assessment & Plan Note (Signed)
Likely nonischemic as she had a normal echo and Myoview. Congratulated her on backing off caffeine. We'll continue acebutolol for now for general cardiovascular protection.

## 2014-04-24 ENCOUNTER — Other Ambulatory Visit: Payer: Self-pay | Admitting: Cardiology

## 2014-04-25 NOTE — Telephone Encounter (Signed)
Rx has been sent to the pharmacy electronically. ° °

## 2014-12-04 ENCOUNTER — Other Ambulatory Visit: Payer: Self-pay | Admitting: Cardiology

## 2014-12-09 HISTORY — PX: ANKLE FRACTURE SURGERY: SHX122

## 2014-12-26 ENCOUNTER — Other Ambulatory Visit: Payer: Self-pay | Admitting: Orthopedic Surgery

## 2014-12-26 ENCOUNTER — Observation Stay (HOSPITAL_COMMUNITY)
Admission: AD | Admit: 2014-12-26 | Discharge: 2014-12-28 | Disposition: A | Discharge status: Skilled Nursing Facility | Payer: BLUE CROSS/BLUE SHIELD | Source: Ambulatory Visit | Attending: Orthopedic Surgery | Admitting: Orthopedic Surgery

## 2014-12-26 DIAGNOSIS — S82892D Other fracture of left lower leg, subsequent encounter for closed fracture with routine healing: Secondary | ICD-10-CM | POA: Insufficient documentation

## 2014-12-26 DIAGNOSIS — E785 Hyperlipidemia, unspecified: Secondary | ICD-10-CM | POA: Diagnosis not present

## 2014-12-26 DIAGNOSIS — X58XXXD Exposure to other specified factors, subsequent encounter: Secondary | ICD-10-CM | POA: Diagnosis not present

## 2014-12-26 DIAGNOSIS — M84412A Pathological fracture, left shoulder, initial encounter for fracture: Secondary | ICD-10-CM | POA: Diagnosis not present

## 2014-12-26 DIAGNOSIS — M8440XA Pathological fracture, unspecified site, initial encounter for fracture: Secondary | ICD-10-CM | POA: Diagnosis present

## 2014-12-26 DIAGNOSIS — I10 Essential (primary) hypertension: Secondary | ICD-10-CM | POA: Diagnosis not present

## 2014-12-26 DIAGNOSIS — M84419A Pathological fracture, unspecified shoulder, initial encounter for fracture: Secondary | ICD-10-CM | POA: Diagnosis present

## 2014-12-26 LAB — CBC
HCT: 37.9 % (ref 36.0–46.0)
HEMOGLOBIN: 12.4 g/dL (ref 12.0–15.0)
MCH: 29.8 pg (ref 26.0–34.0)
MCHC: 32.7 g/dL (ref 30.0–36.0)
MCV: 91.1 fL (ref 78.0–100.0)
PLATELETS: 410 10*3/uL — AB (ref 150–400)
RBC: 4.16 MIL/uL (ref 3.87–5.11)
RDW: 13.9 % (ref 11.5–15.5)
WBC: 9.3 10*3/uL (ref 4.0–10.5)

## 2014-12-26 LAB — BASIC METABOLIC PANEL
Anion gap: 9 (ref 5–15)
BUN: 16 mg/dL (ref 6–20)
CALCIUM: 10.8 mg/dL — AB (ref 8.9–10.3)
CO2: 27 mmol/L (ref 22–32)
Chloride: 102 mmol/L (ref 101–111)
Creatinine, Ser: 0.63 mg/dL (ref 0.44–1.00)
GLUCOSE: 135 mg/dL — AB (ref 65–99)
Potassium: 4.9 mmol/L (ref 3.5–5.1)
SODIUM: 138 mmol/L (ref 135–145)

## 2014-12-26 MED ORDER — ACETAMINOPHEN 650 MG RE SUPP: 650.0000 mg | Freq: Four times a day (QID) | RECTAL | Status: DC | PRN

## 2014-12-26 MED ORDER — ACETAMINOPHEN 325 MG PO TABS
650.0000 mg | ORAL_TABLET | Freq: Four times a day (QID) | ORAL | Status: DC | PRN
  Administered 2014-12-26 – 2014-12-28 (×4): 650 mg via ORAL
  Filled 2014-12-26 (×4): qty 2

## 2014-12-26 MED ORDER — ENOXAPARIN SODIUM 40 MG/0.4ML ~~LOC~~ SOLN
40.0000 mg | SUBCUTANEOUS | Status: DC
  Administered 2014-12-26 – 2014-12-27 (×2): 40 mg via SUBCUTANEOUS
  Filled 2014-12-26 (×3): qty 0.4

## 2014-12-26 NOTE — H&P (Signed)
Alicia Schroeder is an 64 y.o. female.   Chief Complaint: left ankle pain HPI: 64 y/o female without significant PMH injured her left ankle while visiting [place].  She underwent ORIF of the left ankle at that time and contacted my office to schedule f/u since she lives near [name].  Upon arrival to the office today she c/o L shoulder pain.  She reports that it started yesterday when she was trying to push herself up with her arms.  She said she felt a cracking and popping sensation and has had pain and swelling in the left upper chest since then.  She denies any h/o cancer.  She has no h/o smoking.  She denies numbness, tingling or weakness in the L UE.  She has had difficulty being NWB on the L LE prior to the second injury yesterday.  She denies any f/c/v/n/wt loss.  She lives alone in [name], [name].  Past Medical History  Diagnosis Date  . Hyperlipidemia   . Hypertension   . Arthritis      right greater the left hip  . Abnormal ECG   . Family history of premature coronary artery disease     Father with extensive coronary and carotid disease  . Family history of anesthesia complication     sisterin law disabled from anesthesia   . Premature ventricular contractions   . Dysrhythmia     has soft Systolic Ejection Murmur  . Aortic valve sclerosis     no evidence per patient   . Pneumonia     15 years ago approximately   . Cancer     basal cell carcinoma removed- 24 years ago     Past Surgical History  Procedure Laterality Date  . Transthoracic echocardiogram  08/05/2013    Normal LV size and function. EF 60-65%. No regional WMA; grade 1 diastolic dysfunction with mild LA dilation; no significant valvular lesions -- mild aortic valve calcification/sclerosis (could explain a soft systolic murmur)  . Nm lexiscan myoview ltd  08/03/2013    Normal study. No ischemia or infarction. Normal wall motion. EF 71%  . 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 Parkinson's disease Mother   . Thyroid disease Mother   . Hypertension Mother   . Heart failure Mother   . Hypertension Father   . Hyperlipidemia Father   . Heart disease Father 46    Triple bypass.   . Kidney failure Father   . Stroke Maternal Grandmother   . Transient ischemic attack Maternal Grandmother   . Arrhythmia Maternal Grandfather     Pacemaker  . Heart disease Paternal Grandmother   . Parkinson's disease Paternal Grandfather   . Hypertension Paternal Grandfather    Social History:  reports that she has never smoked. She has never used smokeless tobacco. She reports that she drinks alcohol. She reports that she does not use illicit drugs.  Allergies: No Known Allergies  Medications Prior to Admission  Medication Sig Dispense Refill  . acebutolol (SECTRAL) 200 MG capsule TAKE 1 CAPSULE BY MOUTH 2 TIMES DAILY. 60 capsule 3  . aspirin 81 MG tablet Take 81 mg by mouth every evening.     Marland Kitchen atorvastatin (LIPITOR) 20 MG tablet Take 20 mg by mouth daily at 6 PM. Patient takes  in pm    . calcium-vitamin D 250-100 MG-UNIT per tablet Take 2 tablets by mouth every morning.     . celecoxib (CELEBREX) 200 MG capsule Take 200 mg by mouth daily as needed for mild pain.    . cetirizine (ZYRTEC) 10 MG tablet Take 10 mg by mouth every evening.     . Cholecalciferol (VITAMIN D3) 2000 UNITS TABS Take 2,000 Units by mouth every morning.     Marland Kitchen lisinopril (PRINIVIL,ZESTRIL) 20 MG tablet Take 20 mg by mouth every evening.     . magnesium oxide (MAG-OX) 400 MG tablet Take 400 mg by mouth every morning.     Marland Kitchen omega-3 acid ethyl esters (LOVAZA) 1 G capsule Take 1 g by mouth every morning.     . polyvinyl alcohol (LIQUIFILM TEARS) 1.4 % ophthalmic solution Place 1 drop into both eyes as needed for dry eyes.    Marland Kitchen Spirulina  500 MG TABS Take 500 mg by mouth every morning.     . TURMERIC PO Take 1 Dose by mouth every morning.       Results for orders placed or performed during the hospital encounter of 12/26/14 (from the past 48 hour(s))  CBC     Status: Abnormal   Collection Time: 12/26/14  7:32 PM  Result Value Ref Range   WBC 9.3 4.0 - 10.5 K/uL   RBC 4.16 3.87 - 5.11 MIL/uL   Hemoglobin 12.4 12.0 - 15.0 g/dL   HCT 37.9 36.0 - 46.0 %   MCV 91.1 78.0 - 100.0 fL   MCH 29.8 26.0 - 34.0 pg   MCHC 32.7 30.0 - 36.0 g/dL   RDW 13.9 11.5 - 15.5 %   Platelets 410 (H) 150 - 400 K/uL  Basic metabolic panel     Status: Abnormal   Collection Time: 12/26/14  7:32 PM  Result Value Ref Range   Sodium 138 135 - 145 mmol/L   Potassium 4.9 3.5 - 5.1 mmol/L   Chloride 102 101 - 111 mmol/L   CO2 27 22 - 32 mmol/L   Glucose, Bld 135 (H) 65 - 99 mg/dL   BUN 16 6 - 20 mg/dL   Creatinine, Ser 0.63 0.44 - 1.00 mg/dL   Calcium 10.8 (H) 8.9 - 10.3 mg/dL   GFR calc non Af Amer >60 >60 mL/min   GFR calc Af Amer >60 >60 mL/min    Comment: (NOTE) The eGFR has been calculated using the CKD EPI equation. This calculation has not been validated in all clinical situations. eGFR's persistently <60 mL/min signify possible Chronic Kidney Disease.    Anion gap 9 5 - 15   No results found.  ROS  No recent f/c/n/v/wt loss.  Pain as above.  Blood pressure 148/83, pulse 100, temperature 98.5 F (36.9 C), temperature source Oral, resp. rate 18, SpO2 97 %. Physical Exam  wn wd woman in nad.  A and O x 4.  Mood depressed and anxious.  Affect flat.  EOMI.  Resp unlabored.  L clavicle with swelling superiorly.  TTP at midshaft.  NVI at L UE.  L ankle with healing incisions medailly and laterally.  No signs of infection.  Sens to LT intact at the foot dorsally and plantarly.  Brisk cap refill at toes.  No lymphadenopathy.  5/5 strength in PF and DF of the toes.  Xray:  L ankle films show hardware from ORIF of bimal fracture.  No hardware  failure noted.  L clavicle films show a  midshaft fx that is not significantly displaced.  Medial to the fracture there appears to be a lucent lesion with scalloped edges and ill defined margins medially and laterally.  Assessment/Plan 1.  L clavicle pathologic fracture - admission is medically necessary since the pt is unable to be NWB on the L UE and L LE as well as for additional imagining of the clavicle lesion and workup as indicated.  This admission requires complex medical decision making with high risk of a poor outcome due to the uncertain nature of the patient's diagnosis.  2.  L ankle fracture s/p ORIF - the patient with continue to be NWB on the L LE.  New cast applied in the office today.    [name] 12/26/2014, 10:02 PM

## 2014-12-27 ENCOUNTER — Inpatient Hospital Stay (HOSPITAL_COMMUNITY): Payer: BLUE CROSS/BLUE SHIELD

## 2014-12-27 ENCOUNTER — Encounter (HOSPITAL_COMMUNITY): Payer: Self-pay

## 2014-12-27 DIAGNOSIS — M84419A Pathological fracture, unspecified shoulder, initial encounter for fracture: Secondary | ICD-10-CM | POA: Diagnosis present

## 2014-12-27 DIAGNOSIS — M84412A Pathological fracture, left shoulder, initial encounter for fracture: Secondary | ICD-10-CM | POA: Diagnosis not present

## 2014-12-27 MED ORDER — VITAMIN D3 25 MCG (1000 UNIT) PO TABS
2000.0000 [IU] | ORAL_TABLET | Freq: Every morning | ORAL | Status: DC
  Administered 2014-12-28: 2000 [IU] via ORAL
  Filled 2014-12-27 (×2): qty 2

## 2014-12-27 MED ORDER — CELECOXIB 200 MG PO CAPS: 200.0000 mg | ORAL_CAPSULE | Freq: Every day | ORAL | Status: DC | PRN

## 2014-12-27 MED ORDER — LISINOPRIL 20 MG PO TABS
20.0000 mg | ORAL_TABLET | Freq: Every evening | ORAL | Status: DC
  Administered 2014-12-27: 20 mg via ORAL
  Filled 2014-12-27 (×2): qty 1

## 2014-12-27 MED ORDER — CALCIUM CITRATE-VITAMIN D 250-100 MG-UNIT PO TABS: 2.0000 | ORAL_TABLET | Freq: Every morning | ORAL | Status: DC

## 2014-12-27 MED ORDER — MAGNESIUM OXIDE 400 (241.3 MG) MG PO TABS
400.0000 mg | ORAL_TABLET | Freq: Every morning | ORAL | Status: DC
  Administered 2014-12-28: 400 mg via ORAL
  Filled 2014-12-27: qty 1

## 2014-12-27 MED ORDER — ATORVASTATIN CALCIUM 10 MG PO TABS
20.0000 mg | ORAL_TABLET | Freq: Every day | ORAL | Status: DC
  Administered 2014-12-27: 20 mg via ORAL
  Filled 2014-12-27 (×2): qty 2

## 2014-12-27 MED ORDER — LORATADINE 10 MG PO TABS
10.0000 mg | ORAL_TABLET | Freq: Every day | ORAL | Status: DC
  Administered 2014-12-28: 10 mg via ORAL
  Filled 2014-12-27 (×2): qty 1

## 2014-12-27 MED ORDER — ASPIRIN EC 81 MG PO TBEC
81.0000 mg | DELAYED_RELEASE_TABLET | Freq: Every evening | ORAL | Status: DC
  Administered 2014-12-27: 81 mg via ORAL
  Filled 2014-12-27 (×2): qty 1

## 2014-12-27 MED ORDER — OMEGA-3-ACID ETHYL ESTERS 1 G PO CAPS
1.0000 g | ORAL_CAPSULE | Freq: Every morning | ORAL | Status: DC
  Administered 2014-12-28: 1 g via ORAL
  Filled 2014-12-27: qty 1

## 2014-12-27 MED ORDER — ACEBUTOLOL HCL 200 MG PO CAPS
200.0000 mg | ORAL_CAPSULE | Freq: Every day | ORAL | Status: DC
  Administered 2014-12-27 – 2014-12-28 (×2): 200 mg via ORAL
  Filled 2014-12-27 (×2): qty 1

## 2014-12-27 MED ORDER — SPIRULINA 500 MG PO TABS: 500.0000 mg | ORAL_TABLET | Freq: Every morning | ORAL | Status: DC

## 2014-12-27 MED ORDER — IOHEXOL 300 MG/ML  SOLN
75.0000 mL | Freq: Once | INTRAMUSCULAR | Status: AC | PRN
  Administered 2014-12-27: 75 mL via INTRAVENOUS

## 2014-12-27 MED ORDER — POLYVINYL ALCOHOL 1.4 % OP SOLN: 1.0000 [drp] | OPHTHALMIC | Status: DC | PRN

## 2014-12-27 NOTE — Evaluation (Signed)
Physical Therapy Evaluation Patient Details Name: Alicia Schroeder MRN: [id number] DOB: January 26, 1951 Today's Date: 12/27/2014   History of Present Illness  Patient is a 64 y/o female who injured her left ankle while visiting [place] in July and now s/p ORIF of the left ankle presents with left shoulder pain after pushing up from chair and hearing pop/cracking sound. Pt found to have L clavicle pathologic fracture. Workup pending.  Clinical Impression  Patient presents with pain and NWB status LUE/LE impacting safe mobility. Pt lives alone and does not have family support. Pt tolerated SPT to chair with Min A of 2 for balance/safety. Happy to be out of bed. Would benefit from ST SNF to maximize independence and mobility prior to return home.     Follow Up Recommendations SNF    Equipment Recommendations  None recommended by PT    Recommendations for Other Services OT consult     Precautions / Restrictions Precautions Precautions: Fall Restrictions Weight Bearing Restrictions: Yes LUE Weight Bearing: Non weight bearing LLE Weight Bearing: Non weight bearing      Mobility  Bed Mobility Overal bed mobility: Needs Assistance Bed Mobility: Rolling;Sidelying to Sit Rolling: Min guard Sidelying to sit: Min assist;HOB elevated       General bed mobility comments: Min A to elevate trunk. Cues for sequencing, + nausea.   Transfers Overall transfer level: Needs assistance   Transfers: Stand Pivot Transfers   Stand pivot transfers: Min assist;+2 physical assistance       General transfer comment: SPT from bed to recliner towards right side with cues for sequencing. Maintained NWB LUE/LLE.  Ambulation/Gait Ambulation/Gait assistance:  (NA secondary to NWB LUE/LLE.)              Stairs            Wheelchair Mobility    Modified Rankin (Stroke Patients Only)       Balance Overall balance assessment: Needs assistance Sitting-balance support: Feet  supported;No upper extremity supported Sitting balance-Leahy Scale: Good       Standing balance-Leahy Scale: Zero Standing balance comment: Min A during transfer due to NWB status of LUE/LLE.                             Pertinent Vitals/Pain Pain Assessment: Faces Faces Pain Scale: Hurts even more Pain Location: left shoulder Pain Descriptors / Indicators: Sore Pain Intervention(s): Limited activity within patient's tolerance;Monitored during session;Repositioned    Home Living Family/patient expects to be discharged to:: Skilled nursing facility                      Prior Function Level of Independence: Needs assistance   Gait / Transfers Assistance Needed: Pt reports using transport chair to get around and standard walker for short distance ambulation.   ADL's / Homemaking Assistance Needed: Reports able to perform ADLs prior to left clavicle fx.        Hand Dominance        Extremity/Trunk Assessment   Upper Extremity Assessment: Defer to OT evaluation;LUE deficits/detail       LUE Deficits / Details: not fully assessed secondary to pain and NWB LUE. Able to mobilize elbow and wrist wtihout difficulty.    Lower Extremity Assessment: Generalized weakness         Communication   Communication: No difficulties  Cognition Arousal/Alertness: Awake/alert Behavior During Therapy: WFL for tasks assessed/performed Overall Cognitive Status: Within Functional Limits  for tasks assessed                      General Comments General comments (skin integrity, edema, etc.): Reviewed AROM wrist/digits and supporting LUE on pillows to reduce pain and swelling.     Exercises        Assessment/Plan    PT Assessment Patient needs continued PT services  PT Diagnosis Acute pain;Generalized weakness   PT Problem List Decreased strength;Pain;Decreased activity tolerance;Decreased balance;Decreased mobility;Decreased safety awareness  PT  Treatment Interventions Balance training;Gait training;Functional mobility training;Therapeutic activities;Therapeutic exercise;Patient/family education;Wheelchair mobility training   PT Goals (Current goals can be found in the Care Plan section) Acute Rehab PT Goals Patient Stated Goal: to be able to make the wedding dress PT Goal Formulation: With patient Time For Goal Achievement: 01/10/15 Potential to Achieve Goals: Fair    Frequency Min 3X/week   Barriers to discharge Decreased caregiver support      Co-evaluation               End of Session Equipment Utilized During Treatment: Gait belt Activity Tolerance: Patient tolerated treatment well Patient left: in chair;with call bell/phone within reach;with family/visitor present Nurse Communication: Mobility status;Precautions;Weight bearing status (Restrictions to be clarified by MD.)         Time: 1245-8099 PT Time Calculation (min) (ACUTE ONLY): 25 min   Charges:   PT Evaluation $Initial PT Evaluation Tier I: 1 Procedure PT Treatments $Therapeutic Activity: 8-22 mins   PT G Codes:        Remer Couse A Tache Bobst 12/27/2014, 2:15 PM  Wray Kearns, [place], DPT [phone]

## 2014-12-27 NOTE — Progress Notes (Signed)
Subjective: Pt c/o aching pain in the left clavicle.  No significant pain in left ankle.  No n/v/f/c.  Brother and sisterin law are visiting.  CT done this morning.  PT, OT, SW consults in progress.   Objective: Vital signs in last 24 hours: Temp:  [98.1 F (36.7 C)-98.6 F (37 C)] 98.6 F (37 C) (08/09 1250) Pulse Rate:  [44-109] 94 (08/09 1250) Resp:  [18] 18 (08/09 1250) BP: (143-173)/(67-95) 156/94 mmHg (08/09 1250) SpO2:  [97 %-99 %] 97 % (08/09 1250)  Intake/Output from previous day: 08/08 0701 - 08/09 0700 In: 240 [P.O.:240] Out: -  Intake/Output this shift: Total I/O In: 480 [P.O.:480] Out: 2 [Urine:2]   Recent Labs  12/26/14 1932  HGB 12.4    Recent Labs  12/26/14 1932  WBC 9.3  RBC 4.16  HCT 37.9  PLT 410*    Recent Labs  12/26/14 1932  NA 138  K 4.9  CL 102  CO2 27  BUN 16  CREATININE 0.63  GLUCOSE 135*  CALCIUM 10.8*   No results for input(s): LABPT, INR in the last 72 hours.  PE:  wn wd woman in nad.  A and O x 4.  L clavicle swollen.  Skin intact.  NVI at L UE.  L LE cast intact.  CT chest IMPRESSION: Pathologic fracture left mid clavicle. There is a medullary cystic lesion in the mid clavicle through the fracture. There are coarsened trabecula in the medial left clavicle. No aggressive features. This may represent a benign process such as fibrous dysplasia or Paget's disease.  Negative for malignancy in the chest.  Assessment/Plan: L clavicle pathologic fracture - at this point the lesion appears to be benign.  We'll observe for any changes and initiate closed management of this fracture with a sling.  I explained the CT results to the pt in detail.  She understands that the fracture could still move or shift or go on to non union any of which could require surgical treatment.  L ankle fracture s/p ORIF - continue NWB in cast.  SNF placement pending.   Alicia Schroeder 12/27/2014, 4:31 PM

## 2014-12-27 NOTE — Progress Notes (Signed)
OT Cancellation Note  Patient Details Name: Alicia Schroeder MRN: [id number] DOB: Oct 15, 1950   Cancelled Treatment:    Reason Eval/Treat Not Completed: Other (comment). Awaiting clarification from Dr. Doran Durand on pt's ability to use her LUE.  Almon Register [id number] 12/27/2014, 2:06 PM

## 2014-12-27 NOTE — Progress Notes (Signed)
Orthopedic Tech Progress Note Patient Details:  Alicia Schroeder 1951/04/12 [id number]  Ortho Devices Type of Ortho Device: Arm sling Ortho Device/Splint Location: LUE Ortho Device/Splint Interventions: Ordered, Application   Braulio Bosch 12/27/2014, 3:40 PM

## 2014-12-28 DIAGNOSIS — M84412A Pathological fracture, left shoulder, initial encounter for fracture: Secondary | ICD-10-CM | POA: Diagnosis not present

## 2014-12-28 MED ORDER — ACETAMINOPHEN 325 MG PO TABS: 650.0000 mg | ORAL_TABLET | Freq: Four times a day (QID) | ORAL | Status: DC | PRN

## 2014-12-28 MED ORDER — DOCUSATE SODIUM 100 MG PO CAPS: 100.0000 mg | ORAL_CAPSULE | Freq: Two times a day (BID) | ORAL | Status: DC

## 2014-12-28 MED ORDER — ASPIRIN EC 325 MG PO TBEC: 325.0000 mg | DELAYED_RELEASE_TABLET | Freq: Every day | ORAL | Status: DC

## 2014-12-28 MED ORDER — SENNA 8.6 MG PO TABS: 2.0000 | ORAL_TABLET | Freq: Two times a day (BID) | ORAL | Status: DC

## 2014-12-28 MED ORDER — OXYCODONE HCL 5 MG PO TABS: 5.0000 mg | ORAL_TABLET | ORAL | Status: DC | PRN

## 2014-12-28 MED ORDER — CELECOXIB 200 MG PO CAPS
200.0000 mg | ORAL_CAPSULE | Freq: Every day | ORAL | Status: AC
Start: 2014-12-28 — End: 2015-12-28

## 2014-12-28 NOTE — Clinical Social Work Note (Addendum)
2:18pm- Patient is agreeable to STR at [facility].  CSW ran the map to provide proximity to patient's family in [place].  Patient states she may wish to transfer to [facility] SNF once a bed became available if she were not satisfied with [facility].  CSW suggested she speak with [facility] and [facility] to determine if this were a possibility.  Patient is agreeable.  CSW provided update to PA who reports patient will be discharged today pending bed availability.  12:16pm- CSW spoke with patient regarding possible SNF placement.  Patient is requesting [facility] or [facility] in [name].  CSW explained to patient that her insurance Nurse, mental health) requires a pre-authorization for STR/SNF.  Patient is currently medically stable and ready to discharge per RN report.  CSW explained to patient that [facility] would be willing to offer Letter of Guarantee (LOG) for SNF so the patient may begin STR while awaiting authorization from [facility].  CSW explained the limited choice of facilities.  Patient became tearful and states she did not wish to receive STR out of [facility] unless it was in [name] (near family).  CSW contacted [facility] who states they cannot accept patient into their facility without authorization and would not have any female beds available until Monday.  CSW has reviewed this information with the patient.  Patient is requesting [facility] SNF who may also accept a Letter of Guarantee.  CSW will make referral. Nonnie Done, LCSW [phone]  Psychiatric & Orthopedics (5N 1-8) Clinical Social Worker

## 2014-12-28 NOTE — Evaluation (Signed)
Occupational Therapy Evaluation Patient Details Name: Alicia Schroeder MRN: [id number] DOB: 1950/11/25 Today's Date: 12/28/2014    History of Present Illness Patient is a 64 y/o female who injured her left ankle while visiting [place] in July and now s/p ORIF of the left ankle presents with left shoulder pain after pushing up from chair and hearing pop/cracking sound. Pt found to have L clavicle pathologic fracture. Workup pending.   Clinical Impression   Patient presenting with decreased ADL, IADL, functional mobility independence secondary to above. Patient mod I PTA. Patient currently requires total assist for ADLs and up to max assist +2 for transfers. Patient will benefit from acute OT to increase overall independence in the areas of ADLs, functional mobility, and overall safety in order to safely discharge to venue listed below.  Pt very emotional and tearful during this OT eval. Due to this and increased anxiety unable to accomplish a lot. Therapist provided emotional support.     Follow Up Recommendations  SNF;Supervision/Assistance - 24 hour    Equipment Recommendations  Other (comment) (TBD next venue of care)    Recommendations for Other Services  None at this time     Precautions / Restrictions Precautions Precautions: Fall Restrictions Weight Bearing Restrictions: Yes LUE Weight Bearing: Non weight bearing LLE Weight Bearing: Non weight bearing      Mobility Bed Mobility General bed mobility comments: Pt found seated in recliner upon OT entering room, please see PT eval for more information   Transfers Overall transfer level: Needs assistance Equipment used: None Transfers: Sit to/from Stand General transfer comment: Unable to perform with one person from recliner. Pt with increased anxiety and unable to maintain NWB>LLE. Discussed with NT that pt needs +2 in order to maintain NWB>LLE    Balance Overall balance assessment: Needs  assistance Sitting-balance support: No upper extremity supported;Single extremity supported Sitting balance-Leahy Scale: Fair     Standing balance support: No upper extremity supported Standing balance-Leahy Scale: Zero    ADL Overall ADL's : Needs assistance/impaired  General ADL Comments: Pt overall total assist for ADLs due to immobilization & NWB>LUE AND NWB>LLE. Pt unable to stand from recliner with one person while adhereing to NWB>LLE. Pt will need continued OT and will benefit from [facility] SNF. Pt     Pertinent Vitals/Pain Pain Assessment: Faces Faces Pain Scale: Hurts a little bit ("It's not bad") Pain Location: left shoulder Pain Descriptors / Indicators: Sore Pain Intervention(s): Repositioned;Monitored during session;Limited activity within patient's tolerance     Hand Dominance Right   Extremity/Trunk Assessment Upper Extremity Assessment Upper Extremity Assessment: Generalized weakness LUE Deficits / Details: not fully assessed secondary to pain and NWB LUE. Able to mobilize elbow and wrist wtihout difficulty.    Lower Extremity Assessment Lower Extremity Assessment: Defer to PT evaluation   Cervical / Trunk Assessment Cervical / Trunk Assessment: Normal   Communication Communication Communication: No difficulties   Cognition Arousal/Alertness: Awake/alert Behavior During Therapy: WFL for tasks assessed/performed (very tearful) Overall Cognitive Status: Within Functional Limits for tasks assessed             Home Living Family/patient expects to be discharged to:: Skilled nursing facility Living Arrangements: Alone      Prior Functioning/Environment Level of Independence: Needs assistance  Gait / Transfers Assistance Needed: Pt reports using transport chair to get around and standard walker for short distance ambulation.  ADL's / Homemaking Assistance Needed: Reports able to perform ADLs prior to left clavicle fx.    OT Diagnosis:  Generalized  weakness;Acute pain   OT Problem List: Decreased strength;Decreased range of motion;Decreased activity tolerance;Impaired balance (sitting and/or standing);Decreased safety awareness;Decreased knowledge of use of DME or AE;Decreased knowledge of precautions;Pain;Impaired UE functional use   OT Treatment/Interventions: Self-care/ADL training;Therapeutic exercise;Energy conservation;DME and/or AE instruction;Therapeutic activities;Patient/family education;Balance training    OT Goals(Current goals can be found in the care plan section) Acute Rehab OT Goals Patient Stated Goal: to be able to make the wedding dress OT Goal Formulation: With patient Time For Goal Achievement: 01/11/15 Potential to Achieve Goals: Good ADL Goals Pt Will Perform Grooming: with modified independence;sitting Pt Will Perform Upper Body Bathing: sitting;with supervision Pt Will Perform Upper Body Dressing: with supervision;sitting Pt Will Transfer to Toilet: with min assist;bedside commode;stand pivot transfer Pt/caregiver will Perform Home Exercise Program: Right Upper extremity;With Supervision;With written HEP provided (elbow, wrist, and hand ONLY) Additional ADL Goal #1: Pt will be independent with verbalizing and adhereing to LUE precautions and WB status 100% of the time  OT Frequency: Min 2X/week   Barriers to D/C: Decreased caregiver support   End of Session Equipment Utilized During Treatment: Gait belt Nurse Communication: Mobility status  Activity Tolerance: Other (comment) (limited by anxiety, pt very tearful during session) Patient left: in chair;with call bell/phone within reach   Time: 1322-1357 OT Time Calculation (min): 35 min Charges:  OT General Charges $OT Visit: 1 Procedure OT Evaluation $Initial OT Evaluation Tier I: 1 Procedure OT Treatments $Self Care/Home Management : 8-22 mins G-Codes: OT G-codes **NOT FOR INPATIENT CLASS** Functional Limitation: Self care Self Care Current Status  (W0981): At least 80 percent but less than 100 percent impaired, limited or restricted Self Care Goal Status (X9147): At least 1 percent but less than 20 percent impaired, limited or restricted  Casandra Dallaire,[place] , MS, OTR/L, CLT Pager: [phone]  12/28/2014, 2:18 PM

## 2014-12-28 NOTE — Discharge Instructions (Addendum)
Alicia Simmer, MD [place]  Please read the following information regarding your care after surgery.  Medications  You only need a prescription for the narcotic pain medicine (ex. oxycodone, Percocet, Norco).  All of the other medicines listed below are available over the counter. X acetominophen (Tylenol) 650 mg every 4-6 hours as you need for minor pain X celebrex 200 mg daily as needed for pain. ?   Narcotic pain medicine (ex. oxycodone, Percocet, Vicodin) will cause constipation.  To prevent this problem, take the following medicines while you are taking any pain medicine.   X To help prevent blood clots, take an aspirin (325 mg) once a day for a month after surgery.  You should also get up every hour while you are awake to move around.    Weight Bearing X Do not bear any weight on the operated leg or foot. X Do not bear any weight on the left upper extremity  Cast / Splint / Dressing X Keep your splint or cast clean and dry.  Dont put anything (coat hanger, pencil, etc) down inside of it.  If it gets damp, use a hair dryer on the cool setting to dry it.  If it gets soaked, call the office to schedule an appointment for a cast change.   After your dressing, cast or splint is removed; you may shower, but do not soak or scrub the wound.  Allow the water to run over it, and then gently pat it dry.  Swelling It is normal for you to have swelling where you had surgery.  To reduce swelling and pain, keep your toes above your nose for at least 3 days after surgery.  It may be necessary to keep your foot or leg elevated for several weeks.  If it hurts, it should be elevated.  Follow Up Call my office at [phone] when you are discharged from the hospital or surgery center to schedule an appointment to be seen two weeks after surgery.  Call my office at [phone] if you develop a fever >101.5 F, nausea, vomiting, bleeding from the surgical site or severe pain.

## 2014-12-28 NOTE — Discharge Summary (Signed)
Physician Discharge Summary  Patient ID: Alicia Schroeder MRN: [id number] DOB/AGE: 21-Mar-1951 64 y.o.  Admit date: 12/26/2014 Discharge date: 12/28/2014  Admission Diagnoses: Pathologic fracture of Left clavicle; S/P ORIF left ankle fracture; obesity; premature ventricular contractions; essential HTN; hyperlipidemia  Discharge Diagnoses:  Active Problems:   Pathologic fracture   Pathologic fracture of clavicle Same as above  Discharged Condition: stable  Hospital Course: Patient was admitted to the hospital on 12/26/14 after she reported to [facility] for transition to care to Dr. Doran Durand for S/P ORIF of left ankle fracture that was performed in [place] by another Psychologist, sport and exercise.  She c/o of L shoulder pain in the clinic and and x-ray revealed a pathologic fracture of her Left clavicle.  Dr. Doran Durand then admitted the patient to the hospital where she remained NWB for Left Upper and Lower extermities..  A contrast CT of the chest showed the pathologic fracture to have occurred through a benign lesion with the differential diagnoses including fibrous dysplasia and paget's disease.  PT/OT/SW were all consulted and the patient's stay was uneventful.  She has planned discharge today to a SNF as soon as a bed becomes available.  Consults: OT/PT/SW  Significant Diagnostic Studies: radiology: CT scan: of chest with contrast  Treatments: Tylenol, aspirin, lipitor, celebrex, vit. D, lovenox, lisinopril, claritin, magnesium oxide, lovaza, liquifilm tears  Discharge Exam: Blood pressure 139/67, pulse 73, temperature 97.7 F (36.5 C), temperature source Oral, resp. rate 17, SpO2 98 %. General: WDWN patient in NAD. Psych:  Appropriate mood and affect. Neuro:  A&O x 3, Moving all extremities, sensation intact to light touch HEENT:  EOMs intact Chest:  Even non-labored respirations Skin:  Cast is C/D/I, no rashes or lesions Extremities: warm/dry, no edema, erythmea, or echymosis.  No  lymphadenopathy. Pulses: Popliteal pulse 2+, Radial pulse 2+ MSK:  Obvious deformity due to left clavicular fx.  ROM: Full ROM of elbow, wrist, hand, and knee.  Pt can actively DF/PF her toes.  MMT:  Patient can perform quad set on L LE, (-) Homan's   Disposition: 03-[facility]      Discharge Instructions    Call MD / Call 911    Complete by:  As directed   If you experience chest pain or shortness of breath, CALL 911 and be transported to the hospital emergency room.  If you develope a fever above 101 F, pus (white drainage) or increased drainage or redness at the wound, or calf pain, call your surgeon's office.     Constipation Prevention    Complete by:  As directed   Drink plenty of fluids.  Prune juice may be helpful.  You may use a stool softener, such as Colace (over the counter) 100 mg twice a day.  Use MiraLax (over the counter) for constipation as needed.     Diet - low sodium heart healthy    Complete by:  As directed      Increase activity slowly as tolerated    Complete by:  As directed      Non weight bearing    Complete by:  As directed   Laterality:  left  Extremity:  Lower  NWB Left Upper and Lower extremity.  Sling for comfort and while WB.            Medication List    STOP taking these medications        aspirin 81 MG tablet  Replaced by:  aspirin EC 325 MG tablet  TAKE these medications        acebutolol 200 MG capsule  Commonly known as:  SECTRAL  TAKE 1 CAPSULE BY MOUTH 2 TIMES DAILY.     acetaminophen 325 MG tablet  Commonly known as:  TYLENOL  Take 2 tablets (650 mg total) by mouth every 6 (six) hours as needed for mild pain or moderate pain.     aspirin EC 325 MG tablet  Take 1 tablet (325 mg total) by mouth daily.     atorvastatin 20 MG tablet  Commonly known as:  LIPITOR  Take 20 mg by mouth daily at 6 PM. Patient takes in pm     calcium-vitamin D 250-100 MG-UNIT per tablet  Take 2 tablets by mouth every morning.      celecoxib 200 MG capsule  Commonly known as:  CELEBREX  Take 1 capsule (200 mg total) by mouth daily.     cetirizine 10 MG tablet  Commonly known as:  ZYRTEC  Take 10 mg by mouth every evening.     docusate sodium 100 MG capsule  Commonly known as:  COLACE  Take 1 capsule (100 mg total) by mouth 2 (two) times daily. While taking narcotic pain medicine.     lisinopril 20 MG tablet  Commonly known as:  PRINIVIL,ZESTRIL  Take 20 mg by mouth every evening.     magnesium oxide 400 MG tablet  Commonly known as:  MAG-OX  Take 400 mg by mouth every morning.     omega-3 acid ethyl esters 1 G capsule  Commonly known as:  LOVAZA  Take 1 g by mouth every morning.     oxyCODONE 5 MG immediate release tablet  Commonly known as:  ROXICODONE  Take 1-2 tablets (5-10 mg total) by mouth every 4 (four) hours as needed for moderate pain or severe pain.     polyvinyl alcohol 1.4 % ophthalmic solution  Commonly known as:  LIQUIFILM TEARS  Place 1 drop into both eyes as needed for dry eyes.     senna 8.6 MG Tabs tablet  Commonly known as:  SENOKOT  Take 2 tablets (17.2 mg total) by mouth 2 (two) times daily.     Spirulina 500 MG Tabs  Take 500 mg by mouth every morning.     TURMERIC PO  Take 1 Dose by mouth every morning.     Vitamin D3 2000 UNITS Tabs  Take 2,000 Units by mouth every morning.       Follow-up Information    Follow up with Caramia Boutin, Jenny Reichmann, MD. Schedule an appointment as soon as possible for a visit in 2 weeks.   Specialty:  Orthopedic Surgery   Contact information:   [address] [place] [address] [phone]       Signed: Mechele Claude, Hershal Coria, [facility] Office:  [phone]

## 2014-12-29 NOTE — Progress Notes (Signed)
PT eval addendum - added G-codes    2015-01-03 1416  PT G-Codes **NOT FOR INPATIENT CLASS**  Functional Assessment Tool Used clinical judgment  Functional Limitation Mobility: Walking and moving around  Mobility: Walking and Moving Around Current Status [phone]) CJ  Mobility: Walking and Moving Around Goal Status (O1751) [place], PT, DPT [phone]

## 2015-03-06 ENCOUNTER — Encounter (HOSPITAL_BASED_OUTPATIENT_CLINIC_OR_DEPARTMENT_OTHER): Payer: BLUE CROSS/BLUE SHIELD | Attending: Plastic Surgery

## 2015-03-06 DIAGNOSIS — E785 Hyperlipidemia, unspecified: Secondary | ICD-10-CM | POA: Diagnosis not present

## 2015-03-06 DIAGNOSIS — Z8781 Personal history of (healed) traumatic fracture: Secondary | ICD-10-CM | POA: Diagnosis not present

## 2015-03-06 DIAGNOSIS — T8189XA Other complications of procedures, not elsewhere classified, initial encounter: Secondary | ICD-10-CM | POA: Diagnosis not present

## 2015-03-06 DIAGNOSIS — M1990 Unspecified osteoarthritis, unspecified site: Secondary | ICD-10-CM | POA: Diagnosis not present

## 2015-03-06 DIAGNOSIS — Y838 Other surgical procedures as the cause of abnormal reaction of the patient, or of later complication, without mention of misadventure at the time of the procedure: Secondary | ICD-10-CM | POA: Insufficient documentation

## 2015-03-13 DIAGNOSIS — E785 Hyperlipidemia, unspecified: Secondary | ICD-10-CM | POA: Diagnosis not present

## 2015-03-13 DIAGNOSIS — T8189XA Other complications of procedures, not elsewhere classified, initial encounter: Secondary | ICD-10-CM | POA: Diagnosis not present

## 2015-03-13 DIAGNOSIS — M1990 Unspecified osteoarthritis, unspecified site: Secondary | ICD-10-CM | POA: Diagnosis not present

## 2015-03-13 DIAGNOSIS — Z8781 Personal history of (healed) traumatic fracture: Secondary | ICD-10-CM | POA: Diagnosis not present

## 2015-03-16 ENCOUNTER — Ambulatory Visit (INDEPENDENT_AMBULATORY_CARE_PROVIDER_SITE_OTHER): Payer: BLUE CROSS/BLUE SHIELD | Admitting: Cardiology

## 2015-03-16 VITALS — BP 132/90 | HR 62 | Ht 66.0 in | Wt 230.7 lb

## 2015-03-16 DIAGNOSIS — I10 Essential (primary) hypertension: Secondary | ICD-10-CM | POA: Diagnosis not present

## 2015-03-16 DIAGNOSIS — E669 Obesity, unspecified: Secondary | ICD-10-CM | POA: Diagnosis not present

## 2015-03-16 DIAGNOSIS — I493 Ventricular premature depolarization: Secondary | ICD-10-CM | POA: Diagnosis not present

## 2015-03-16 DIAGNOSIS — E782 Mixed hyperlipidemia: Secondary | ICD-10-CM | POA: Diagnosis not present

## 2015-03-16 NOTE — Progress Notes (Signed)
PCP: Alicia Coma, MD  Clinic Note: Chief Complaint  Patient presents with  . Annual Exam    pt states no Sx.  Marland Kitchen Hypertension    Hyperlipidemia, obesity with family history of CAD    HPI: Alicia Schroeder is a 64 y.o. female with a PMH below who presents today for annual f/u of HTN, HLD & strong FH of CAD.   I first met her when I was doing cardiovascular risk assessment for a upcoming surgery. She has a a significant family history of CAD -- (father w/ initial onset of disease was in his 44s, he had carotid endarterectomy as well as 3 vessel CABG plus valve replacement. He eventually developed ischemic cardiomyopathy). He has a history of PVCs.  Her EKG performed during a clinic visit earlier 2015 demonstrated PVCs and trigeminy pattern. Was on Acebutalol for palpitations.  Alicia Schroeder was last seen on 04/18/2014 -- was doing well.   Recent Hospitalizations: Aug 8th - Pathologic Fxr of Left Clavicle after L Ankle fxr - complicated by poorly healing wound on L ankle due to poor bone union  Studies Reviewed: none  Interval History: Besides her broken bone issues, she has not had any significant cardiac issues.  The only major thing is that because of her injuries, she is no longer been active for several months, and has gained back a lot of weight and she previously lost. She still has difficulty walking with her ankle discomfort, but is trying to get back into better exercise routine. She has been stable overall from a cardiac standpoint. Cardiac review of systems as follows: No chest pain or shortness of breath with rest or exertion.  No PND, orthopnea or edema. No palpitations, lightheadedness, dizziness, weakness or syncope/near syncope. No TIA/amaurosis fugax symptoms. No claudication.  ROS: A comprehensive was performed. Review of Systems  Constitutional: Negative for malaise/fatigue.  HENT: Negative for nosebleeds.   Respiratory: Negative for cough and wheezing.     Cardiovascular: Negative for claudication.  Gastrointestinal: Negative for blood in stool and melena.  Genitourinary: Negative for hematuria.  Musculoskeletal: Positive for joint pain (Left ankle).  Endo/Heme/Allergies: Does not bruise/bleed easily.  All other systems reviewed and are negative.   Past Medical History  Diagnosis Date  . Hyperlipidemia   . Hypertension   . Arthritis      right greater the left hip  . Abnormal ECG   . Family history of premature coronary artery disease     Father with extensive coronary and carotid disease  . Family history of anesthesia complication     sisterin law disabled from anesthesia   . Premature ventricular contractions   . Dysrhythmia     has soft Systolic Ejection Murmur  . Aortic valve sclerosis     no evidence per patient   . Pneumonia     15 years ago approximately   . Cancer ([place])     basal cell carcinoma removed- 24 years ago     Past Surgical History  Procedure Laterality Date  . Transthoracic echocardiogram  08/05/2013    Normal LV size and function. EF 60-65%. No regional WMA; grade 1 diastolic dysfunction with mild LA dilation; no significant valvular lesions -- mild aortic valve calcification/sclerosis (could explain a soft systolic murmur)  . Nm lexiscan myoview ltd  08/03/2013    Normal study. No ischemia or infarction. Normal wall motion. EF 71%  . Total hip arthroplasty Left 09/29/2013    Procedure: LEFT TOTAL HIP ARTHROPLASTY ANTERIOR APPROACH;  Surgeon: Gearlean Alf, MD;  Location: [facility];  Service: Orthopedics;  Laterality: Left;   Prior to Admission medications   Medication Sig Start Date End Date Taking? Authorizing Provider  acebutolol (SECTRAL) 200 MG capsule TAKE 1 CAPSULE BY MOUTH 2 TIMES DAILY. 12/05/14  Yes Leonie Man, MD  Ascorbic Acid (VITAMIN C) 1000 MG tablet Take 1,000 mg by mouth daily.   Yes Historical Provider, MD  aspirin EC 325 MG tablet Take 1 tablet (325 mg total) by mouth daily. 12/28/14   Yes Marya Amsler Ollis, PA-C  atorvastatin (LIPITOR) 20 MG tablet Take 20 mg by mouth daily at 6 PM. Patient takes in pm 04/20/13  Yes Historical Provider, MD  calcium-vitamin D 250-100 MG-UNIT per tablet Take 2 tablets by mouth every morning.    Yes Historical Provider, MD  celecoxib (CELEBREX) 200 MG capsule Take 1 capsule (200 mg total) by mouth daily. 12/28/14 12/28/15 Yes Justin Pike Ollis, PA-C  cetirizine (ZYRTEC) 10 MG tablet Take 10 mg by mouth every evening.    Yes Historical Provider, MD  Cholecalciferol (VITAMIN D3) 2000 UNITS TABS Take 2,000 Units by mouth every morning.    Yes Historical Provider, MD  lisinopril (PRINIVIL,ZESTRIL) 20 MG tablet Take 20 mg by mouth every evening.  04/20/13  Yes Historical Provider, MD  magnesium oxide (MAG-OX) 400 MG tablet Take 400 mg by mouth every morning.    Yes Historical Provider, MD  Multiple Vitamins-Minerals (MULTIVITAMIN ADULT PO) Take 1 tablet by mouth daily.   Yes Historical Provider, MD  omega-3 acid ethyl esters (LOVAZA) 1 G capsule Take 1 g by mouth every morning.    Yes Historical Provider, MD  PROTEIN PO Take 1 tablet by mouth daily.   Yes Historical Provider, MD  Spirulina 500 MG TABS Take 500 mg by mouth every morning.    Yes Historical Provider, MD  TURMERIC PO Take 1 Dose by mouth every morning.    Yes Historical Provider, MD  Zinc 15 MG CAPS Take 1 capsule by mouth.   Yes Historical Provider, MD    No Known Allergies   Social History   Social History  . Marital Status: Divorced    Spouse Name: N/A  . Number of Children: N/A  . Years of Education: N/A   Social History Main Topics  . Smoking status: Never Smoker   . Smokeless tobacco: Never Used  . Alcohol Use: Yes     Comment: occasional   . Drug Use: No  . Sexual Activity: Not Asked   Other Topics Concern  . None   Social History Narrative   She is a divorced mother of 2. She's been divorced for 10 years. She does long with her dog. 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    She's never smoked. Does not drink alcohol.    she does daily exercises for her hips 7 nasal week for 20 minutes at a time. This is mostly stretching and. Because of her hip pain she is no longer able to walk like she used to.    Phone [phone]   Children: Sahiba Granholm [place], [place] - [phone]); Ahana Najera [place], [place] [phone] since about.     Family History  Problem Relation Age of Onset  . Parkinson's disease Mother   . Thyroid disease Mother   . Hypertension Mother   . Heart failure Mother   . Hypertension Father   . Hyperlipidemia Father   . Heart disease  Father 1    Triple bypass.   . Kidney failure Father   . Stroke Maternal Grandmother   . Transient ischemic attack Maternal Grandmother   . Arrhythmia Maternal Grandfather     Pacemaker  . Heart disease Paternal Grandmother   . Parkinson's disease Paternal Grandfather   . Hypertension Paternal Grandfather     Wt Readings from Last 3 Encounters:  03/16/15 230 lb 11.2 oz (104.645 kg)  04/18/14 232 lb 3.2 oz (105.325 kg)  09/29/13 224 lb (101.606 kg)  Had lost ~20 lb before ankle fxr - gained most of it back while in rehab  PHYSICAL EXAM BP 132/90 mmHg  Pulse 62  Ht _0  (1.676 m)  Wt 230 lb 11.2 oz (104.645 kg)  BMI 37.25 kg/m2  @ home 120/76 mmHg General appearance: alert, cooperative, appears stated age, no distress, moderately obese and Well-nourished and well-groomed. Pleasant mood and affect. Answers questions appropriately HEENT: [place]/AT, EOMI, MMM, anicteric sclera Neck: Supple, no adenopathy, carotid bruit, or JVD, Lungs:CTAB, normal percussion bilaterally and Nonlabored, and good air movement Heart: RRR with frequent ectopy. Normal S1-S2. Soft 1/6 SEM @ RUSB; no other R/G. Nondisplaced PMI. Abdomen: soft,NT/ND/NABS; no masses, no organomegaly and mild truncal obesity Extremities: No C/C/E; no rash or lesions.; Pulses: 2+ and  symmetric Neurologic: Alert and oriented X 3, normal strength and tone.     Adult ECG Report  Rate: 62 ;  Rhythm: normal sinus rhythm and IRBB; normal axis, intervals and durations.  Narrative Interpretation: otherwise stable/normal EKG   Other studies Reviewed: Additional studies/ records that were reviewed today include:  Recent Labs:  No recent lipid panel     ASSESSMENT / PLAN: Problem List Items Addressed This Visit    PVC's (premature ventricular contractions) (Chronic)    Significantly improved after reducing caffeine intake and stable dose of acebutolol.      Relevant Orders   EKG 12-Lead   Obesity (BMI 30-39.9) (Chronic)    The patient understands the need to lose weight with diet and exercise. We have discussed specific strategies for this.      Mixed hyperlipidemia (Chronic)    On moderate dose of atorvastatin. Monitored by PCP. Unfortunately, lab values not currently present.      Relevant Orders   EKG 12-Lead   Essential hypertension - Primary (Chronic)    Stable blood pressure just diastolic pressures a little higher than it should be. At home she says is much controlled. For now will continue current medication and. She is on acebutolol and lisinopril.      Relevant Orders   EKG 12-Lead      Current medicines are reviewed at length with the patient today. (+/- concerns) none The following changes have been made: none   ROV 1 yr   Studies Ordered:   Orders Placed This Encounter  Procedures  . EKG 12-Lead      Leonie Man, M.D., M.S. Interventional Cardiologist   Pager # [phone]

## 2015-03-16 NOTE — Patient Instructions (Signed)
NO CHANGES IN CURRENT MEDICATIONS   Your physician wants you to follow-up in [place].  You will receive a reminder letter in the mail two months in advance. If you don't receive a letter, please call our office to schedule the follow-up appointment.   If you need a refill on your cardiac medications before your next appointment, please call your pharmacy.

## 2015-03-18 ENCOUNTER — Encounter: Payer: Self-pay | Admitting: Cardiology

## 2015-03-18 NOTE — Assessment & Plan Note (Signed)
The patient understands the need to lose weight with diet and exercise. We have discussed specific strategies for this.  

## 2015-03-18 NOTE — Assessment & Plan Note (Signed)
Significantly improved after reducing caffeine intake and stable dose of acebutolol.

## 2015-03-18 NOTE — Assessment & Plan Note (Signed)
Stable blood pressure just diastolic pressures a little higher than it should be. At home she says is much controlled. For now will continue current medication and. She is on acebutolol and lisinopril.

## 2015-03-18 NOTE — Assessment & Plan Note (Signed)
On moderate dose of atorvastatin. Monitored by PCP. Unfortunately, lab values not currently present.

## 2015-03-20 DIAGNOSIS — T8189XA Other complications of procedures, not elsewhere classified, initial encounter: Secondary | ICD-10-CM | POA: Diagnosis not present

## 2015-03-20 DIAGNOSIS — M1990 Unspecified osteoarthritis, unspecified site: Secondary | ICD-10-CM | POA: Diagnosis not present

## 2015-03-20 DIAGNOSIS — Z8781 Personal history of (healed) traumatic fracture: Secondary | ICD-10-CM | POA: Diagnosis not present

## 2015-03-20 DIAGNOSIS — E785 Hyperlipidemia, unspecified: Secondary | ICD-10-CM | POA: Diagnosis not present

## 2015-03-21 ENCOUNTER — Encounter (HOSPITAL_BASED_OUTPATIENT_CLINIC_OR_DEPARTMENT_OTHER): Payer: Self-pay | Admitting: *Deleted

## 2015-03-21 ENCOUNTER — Other Ambulatory Visit: Payer: Self-pay | Admitting: Plastic Surgery

## 2015-03-21 DIAGNOSIS — S91002A Unspecified open wound, left ankle, initial encounter: Secondary | ICD-10-CM

## 2015-03-27 ENCOUNTER — Ambulatory Visit (HOSPITAL_BASED_OUTPATIENT_CLINIC_OR_DEPARTMENT_OTHER): Payer: BLUE CROSS/BLUE SHIELD | Admitting: Anesthesiology

## 2015-03-27 ENCOUNTER — Ambulatory Visit (HOSPITAL_BASED_OUTPATIENT_CLINIC_OR_DEPARTMENT_OTHER)
Admission: RE | Admit: 2015-03-27 | Discharge: 2015-03-27 | Disposition: A | Discharge status: Home / Self Care | Payer: BLUE CROSS/BLUE SHIELD | Source: Ambulatory Visit | Attending: Plastic Surgery | Admitting: Plastic Surgery

## 2015-03-27 ENCOUNTER — Encounter (HOSPITAL_BASED_OUTPATIENT_CLINIC_OR_DEPARTMENT_OTHER): Payer: Self-pay | Admitting: *Deleted

## 2015-03-27 ENCOUNTER — Encounter (HOSPITAL_BASED_OUTPATIENT_CLINIC_OR_DEPARTMENT_OTHER)
Admission: RE | Disposition: A | Discharge status: Home / Self Care | Payer: Self-pay | Source: Ambulatory Visit | Attending: Plastic Surgery

## 2015-03-27 DIAGNOSIS — X58XXXA Exposure to other specified factors, initial encounter: Secondary | ICD-10-CM | POA: Diagnosis not present

## 2015-03-27 DIAGNOSIS — Z96642 Presence of left artificial hip joint: Secondary | ICD-10-CM | POA: Diagnosis not present

## 2015-03-27 DIAGNOSIS — E669 Obesity, unspecified: Secondary | ICD-10-CM | POA: Diagnosis not present

## 2015-03-27 DIAGNOSIS — S91002A Unspecified open wound, left ankle, initial encounter: Secondary | ICD-10-CM | POA: Diagnosis not present

## 2015-03-27 DIAGNOSIS — M1990 Unspecified osteoarthritis, unspecified site: Secondary | ICD-10-CM | POA: Diagnosis not present

## 2015-03-27 DIAGNOSIS — I10 Essential (primary) hypertension: Secondary | ICD-10-CM | POA: Insufficient documentation

## 2015-03-27 DIAGNOSIS — Z7982 Long term (current) use of aspirin: Secondary | ICD-10-CM | POA: Insufficient documentation

## 2015-03-27 DIAGNOSIS — Z79899 Other long term (current) drug therapy: Secondary | ICD-10-CM | POA: Insufficient documentation

## 2015-03-27 DIAGNOSIS — Z6836 Body mass index (BMI) 36.0-36.9, adult: Secondary | ICD-10-CM | POA: Insufficient documentation

## 2015-03-27 DIAGNOSIS — E785 Hyperlipidemia, unspecified: Secondary | ICD-10-CM | POA: Insufficient documentation

## 2015-03-27 HISTORY — PX: APPLICATION OF A-CELL OF EXTREMITY: SHX6303

## 2015-03-27 HISTORY — PX: I & D EXTREMITY: SHX5045

## 2015-03-27 HISTORY — DX: Depression, unspecified: F32.A

## 2015-03-27 HISTORY — DX: Major depressive disorder, single episode, unspecified: F32.9

## 2015-03-27 SURGERY — IRRIGATION AND DEBRIDEMENT EXTREMITY
Anesthesia: General | Site: Ankle | Laterality: Left

## 2015-03-27 MED ORDER — HYDROCODONE-ACETAMINOPHEN 5-325 MG PO TABS: 1.0000 | ORAL_TABLET | Freq: Four times a day (QID) | ORAL | Status: DC | PRN

## 2015-03-27 MED ORDER — DEXAMETHASONE SODIUM PHOSPHATE 4 MG/ML IJ SOLN
INTRAMUSCULAR | Status: DC | PRN
  Administered 2015-03-27: 8 mg via INTRAVENOUS

## 2015-03-27 MED ORDER — LIDOCAINE-EPINEPHRINE 1 %-1:100000 IJ SOLN
INTRAMUSCULAR | Status: AC
  Filled 2015-03-27: qty 2

## 2015-03-27 MED ORDER — BUPIVACAINE-EPINEPHRINE (PF) 0.25% -1:200000 IJ SOLN
INTRAMUSCULAR | Status: AC
  Filled 2015-03-27: qty 60

## 2015-03-27 MED ORDER — MIDAZOLAM HCL 2 MG/2ML IJ SOLN
1.0000 mg | INTRAMUSCULAR | Status: DC | PRN
  Administered 2015-03-27: 2 mg via INTRAVENOUS

## 2015-03-27 MED ORDER — CEFAZOLIN SODIUM-DEXTROSE 2-3 GM-% IV SOLR
INTRAVENOUS | Status: AC
  Filled 2015-03-27: qty 50

## 2015-03-27 MED ORDER — LACTATED RINGERS IV SOLN
INTRAVENOUS | Status: DC
  Administered 2015-03-27: 14:00:00 via INTRAVENOUS

## 2015-03-27 MED ORDER — PHENYLEPHRINE HCL 10 MG/ML IJ SOLN
INTRAMUSCULAR | Status: DC | PRN
  Administered 2015-03-27 (×3): 80 ug via INTRAVENOUS

## 2015-03-27 MED ORDER — LIDOCAINE HCL (CARDIAC) 20 MG/ML IV SOLN
INTRAVENOUS | Status: DC | PRN
  Administered 2015-03-27: 50 mg via INTRAVENOUS

## 2015-03-27 MED ORDER — BUPIVACAINE HCL (PF) 0.25 % IJ SOLN
INTRAMUSCULAR | Status: AC
  Filled 2015-03-27: qty 60

## 2015-03-27 MED ORDER — GLYCOPYRROLATE 0.2 MG/ML IJ SOLN: 0.2000 mg | Freq: Once | INTRAMUSCULAR | Status: DC | PRN

## 2015-03-27 MED ORDER — CEFAZOLIN SODIUM-DEXTROSE 2-3 GM-% IV SOLR
2.0000 g | INTRAVENOUS | Status: AC
  Administered 2015-03-27: 2 g via INTRAVENOUS

## 2015-03-27 MED ORDER — EPHEDRINE SULFATE 50 MG/ML IJ SOLN
INTRAMUSCULAR | Status: DC | PRN
  Administered 2015-03-27 (×3): 10 mg via INTRAVENOUS

## 2015-03-27 MED ORDER — SCOPOLAMINE 1 MG/3DAYS TD PT72: 1.0000 | MEDICATED_PATCH | Freq: Once | TRANSDERMAL | Status: DC | PRN

## 2015-03-27 MED ORDER — ONDANSETRON HCL 4 MG/2ML IJ SOLN
INTRAMUSCULAR | Status: DC | PRN
  Administered 2015-03-27: 4 mg via INTRAVENOUS

## 2015-03-27 MED ORDER — FENTANYL CITRATE (PF) 100 MCG/2ML IJ SOLN
50.0000 ug | INTRAMUSCULAR | Status: DC | PRN
  Administered 2015-03-27: 100 ug via INTRAVENOUS

## 2015-03-27 MED ORDER — LIDOCAINE-EPINEPHRINE 1 %-1:100000 IJ SOLN
INTRAMUSCULAR | Status: DC | PRN
  Administered 2015-03-27: 3 mL

## 2015-03-27 MED ORDER — MIDAZOLAM HCL 2 MG/2ML IJ SOLN
INTRAMUSCULAR | Status: AC
  Filled 2015-03-27: qty 4

## 2015-03-27 MED ORDER — BACITRACIN-NEOMYCIN-POLYMYXIN 400-5-5000 EX OINT
TOPICAL_OINTMENT | CUTANEOUS | Status: AC
  Filled 2015-03-27: qty 1

## 2015-03-27 MED ORDER — SODIUM CHLORIDE 0.9 % IR SOLN
Status: DC | PRN
  Administered 2015-03-27: 500 mL

## 2015-03-27 MED ORDER — LIDOCAINE HCL (PF) 1 % IJ SOLN
INTRAMUSCULAR | Status: AC
  Filled 2015-03-27: qty 60

## 2015-03-27 MED ORDER — FENTANYL CITRATE (PF) 100 MCG/2ML IJ SOLN
INTRAMUSCULAR | Status: AC
  Filled 2015-03-27: qty 2

## 2015-03-27 MED ORDER — PROPOFOL 10 MG/ML IV BOLUS
INTRAVENOUS | Status: DC | PRN
  Administered 2015-03-27: 200 mg via INTRAVENOUS

## 2015-03-27 SURGICAL SUPPLY — 69 items
BAG DECANTER FOR FLEXI CONT (MISCELLANEOUS) ×2 IMPLANT
BANDAGE ELASTIC 3 VELCRO ST LF (GAUZE/BANDAGES/DRESSINGS) IMPLANT
BANDAGE ELASTIC 4 VELCRO ST LF (GAUZE/BANDAGES/DRESSINGS) ×2 IMPLANT
BANDAGE ELASTIC 6 VELCRO ST LF (GAUZE/BANDAGES/DRESSINGS) IMPLANT
BLADE HEX COATED 2.75 (ELECTRODE) ×2 IMPLANT
BLADE SURG 10 STRL SS (BLADE) IMPLANT
BLADE SURG 15 STRL LF DISP TIS (BLADE) ×1 IMPLANT
BLADE SURG 15 STRL SS (BLADE) ×1
BNDG COHESIVE 4X5 TAN STRL (GAUZE/BANDAGES/DRESSINGS) IMPLANT
BNDG CONFORM 2 STRL LF (GAUZE/BANDAGES/DRESSINGS) IMPLANT
BNDG CONFORM 3 STRL LF (GAUZE/BANDAGES/DRESSINGS) IMPLANT
BNDG GAUZE ELAST 4 BULKY (GAUZE/BANDAGES/DRESSINGS) ×2 IMPLANT
CANISTER SUCT 1200ML W/VALVE (MISCELLANEOUS) ×2 IMPLANT
CHLORAPREP W/TINT 26ML (MISCELLANEOUS) IMPLANT
COVER BACK TABLE 60X90IN (DRAPES) ×2 IMPLANT
DECANTER SPIKE VIAL GLASS SM (MISCELLANEOUS) IMPLANT
DRAPE INCISE IOBAN 66X45 STRL (DRAPES) ×2 IMPLANT
DRAPE U-SHAPE 76X120 STRL (DRAPES) ×2 IMPLANT
DRESSING DUODERM 4X4 STERILE (GAUZE/BANDAGES/DRESSINGS) ×2 IMPLANT
DRSG ADAPTIC 3X8 NADH LF (GAUZE/BANDAGES/DRESSINGS) ×2 IMPLANT
DRSG EMULSION OIL 3X3 NADH (GAUZE/BANDAGES/DRESSINGS) IMPLANT
DRSG PAD ABDOMINAL 8X10 ST (GAUZE/BANDAGES/DRESSINGS) IMPLANT
ELECT REM PT RETURN 9FT ADLT (ELECTROSURGICAL) ×2 IMPLANT
ELECTRODE REM PT RTRN 9FT ADLT (ELECTROSURGICAL) ×1 IMPLANT
GAUZE SPONGE 4X4 12PLY STRL (GAUZE/BANDAGES/DRESSINGS) ×2 IMPLANT
GAUZE XEROFORM 1X8 LF (GAUZE/BANDAGES/DRESSINGS) IMPLANT
GAUZE XEROFORM 5X9 LF (GAUZE/BANDAGES/DRESSINGS) IMPLANT
GLOVE BIO SURGEON STRL SZ 6.5 (GLOVE) ×6 IMPLANT
GLOVE BIO SURGEON STRL SZ8 (GLOVE) IMPLANT
GLOVE BIOGEL M STRL SZ7.5 (GLOVE) ×2 IMPLANT
GLOVE BIOGEL PI IND STRL 8 (GLOVE) ×1 IMPLANT
GLOVE BIOGEL PI INDICATOR 8 (GLOVE) ×1
GOWN STRL REUS W/ TWL LRG LVL3 (GOWN DISPOSABLE) ×2 IMPLANT
GOWN STRL REUS W/TWL LRG LVL3 (GOWN DISPOSABLE) ×2
MANIFOLD NEPTUNE II (INSTRUMENTS) IMPLANT
MATRIX SURGICAL PSM 5X5CM (Tissue) ×2 IMPLANT
MICROMATRIX 500MG (Tissue) ×2 IMPLANT
NEEDLE HYPO 25X1 1.5 SAFETY (NEEDLE) IMPLANT
NS IRRIG 1000ML POUR BTL (IV SOLUTION) ×2 IMPLANT
PACK BASIN DAY SURGERY FS (CUSTOM PROCEDURE TRAY) ×2 IMPLANT
PADDING CAST ABS 3INX4YD NS (CAST SUPPLIES)
PADDING CAST ABS 4INX4YD NS (CAST SUPPLIES)
PADDING CAST ABS COTTON 3X4 (CAST SUPPLIES) IMPLANT
PADDING CAST ABS COTTON 4X4 ST (CAST SUPPLIES) IMPLANT
PENCIL BUTTON HOLSTER BLD 10FT (ELECTRODE) IMPLANT
SHEET MEDIUM DRAPE 40X70 STRL (DRAPES) ×2 IMPLANT
SLEEVE SCD COMPRESS KNEE MED (MISCELLANEOUS) IMPLANT
SOLUTION PARTIC MCRMTRX 500MG (Tissue) ×1 IMPLANT
SPLINT FIBERGLASS 3X35 (CAST SUPPLIES) ×2 IMPLANT
SPLINT FIBERGLASS 4X30 (CAST SUPPLIES) IMPLANT
SPLINT PLASTER CAST XFAST 3X15 (CAST SUPPLIES) IMPLANT
SPLINT PLASTER XTRA FASTSET 3X (CAST SUPPLIES)
SPONGE GAUZE 4X4 12PLY STER LF (GAUZE/BANDAGES/DRESSINGS) IMPLANT
SPONGE LAP 18X18 X RAY DECT (DISPOSABLE) ×2 IMPLANT
STAPLER VISISTAT 35W (STAPLE) IMPLANT
STOCKINETTE IMPERVIOUS LG (DRAPES) IMPLANT
STRIP CLOSURE SKIN 1/2X4 (GAUZE/BANDAGES/DRESSINGS) IMPLANT
SURGILUBE 2OZ TUBE FLIPTOP (MISCELLANEOUS) IMPLANT
SUT SILK 3 0 PS 1 (SUTURE) IMPLANT
SUT SILK 4 0 PS 2 (SUTURE) IMPLANT
SUT VIC AB 5-0 PS2 18 (SUTURE) ×2 IMPLANT
SYR BULB IRRIGATION 50ML (SYRINGE) ×2 IMPLANT
SYR CONTROL 10ML LL (SYRINGE) IMPLANT
TAPE HYPAFIX 6X30 (GAUZE/BANDAGES/DRESSINGS) IMPLANT
TOWEL OR 17X24 6PK STRL BLUE (TOWEL DISPOSABLE) ×2 IMPLANT
TRAY DSU PREP LF (CUSTOM PROCEDURE TRAY) ×2 IMPLANT
TUBE CONNECTING 20X1/4 (TUBING) ×2 IMPLANT
UNDERPAD 30X30 (UNDERPADS AND DIAPERS) ×2 IMPLANT
YANKAUER SUCT BULB TIP NO VENT (SUCTIONS) ×2 IMPLANT

## 2015-03-27 NOTE — H&P (Signed)
Alicia Schroeder is an 64 y.o. female.   Chief Complaint: left ankle wound HPI: The patient is a 64 yrs old wf here with a left ankle wound.  She had an ankle fracture with open wound.  The bone fracture was repair but she still has the wound.  She has been doing local dressings with slow progress.  She has multiple medical conditions but they are all stable at present.  Past Medical History  Diagnosis Date  . Hyperlipidemia   . Hypertension   . Arthritis      right greater the left hip  . Abnormal ECG   . Family history of premature coronary artery disease     Father with extensive coronary and carotid disease  . Family history of anesthesia complication     sisterin law disabled from anesthesia   . Premature ventricular contractions   . Dysrhythmia     has soft Systolic Ejection Murmur  . Aortic valve sclerosis     no evidence per patient   . Pneumonia     15 years ago approximately   . Cancer ([place])     basal cell carcinoma removed- 24 years ago   . Depression     no meds    Past Surgical History  Procedure Laterality Date  . Transthoracic echocardiogram  08/05/2013    Normal LV size and function. EF 60-65%. No regional WMA; grade 1 diastolic dysfunction with mild LA dilation; no significant valvular lesions -- mild aortic valve calcification/sclerosis (could explain a soft systolic murmur)  . Nm lexiscan myoview ltd  08/03/2013    Normal study. No ischemia or infarction. Normal wall motion. EF 71%  . Total hip arthroplasty Left 09/29/2013    Procedure: LEFT TOTAL HIP ARTHROPLASTY ANTERIOR APPROACH;  Surgeon: Gearlean Alf, MD;  Location: [facility];  Service: Orthopedics;  Laterality: Left;  . Joint replacement Left   . Ankle fracture surgery Left 12-09-14    Family History  Problem Relation Age of Onset  . Parkinson's disease Mother   . Thyroid disease Mother   . Hypertension Mother   . Heart failure Mother   . Hypertension Father   . Hyperlipidemia Father   . Heart  disease Father 35    Triple bypass.   . Kidney failure Father   . Stroke Maternal Grandmother   . Transient ischemic attack Maternal Grandmother   . Arrhythmia Maternal Grandfather     Pacemaker  . Heart disease Paternal Grandmother   . Parkinson's disease Paternal Grandfather   . Hypertension Paternal Grandfather    Social History:  reports that she has never smoked. She has never used smokeless tobacco. She reports that she drinks alcohol. She reports that she does not use illicit drugs.  Allergies: No Known Allergies  Medications Prior to Admission  Medication Sig Dispense Refill  . acebutolol (SECTRAL) 200 MG capsule TAKE 1 CAPSULE BY MOUTH 2 TIMES DAILY. 60 capsule 3  . Ascorbic Acid (VITAMIN C) 1000 MG tablet Take 1,000 mg by mouth daily.    Marland Kitchen aspirin EC 325 MG tablet Take 1 tablet (325 mg total) by mouth daily. 42 tablet 0  . atorvastatin (LIPITOR) 20 MG tablet Take 20 mg by mouth daily at 6 PM. Patient takes in pm    . calcium-vitamin D 250-100 MG-UNIT per tablet Take 2 tablets by mouth every morning.     . celecoxib (CELEBREX) 200 MG capsule Take 1 capsule (200 mg total) by mouth daily. 30 capsule 2  .  cetirizine (ZYRTEC) 10 MG tablet Take 10 mg by mouth every evening.     . Cholecalciferol (VITAMIN D3) 2000 UNITS TABS Take 2,000 Units by mouth every morning.     Marland Kitchen lisinopril (PRINIVIL,ZESTRIL) 20 MG tablet Take 20 mg by mouth every evening.     . magnesium oxide (MAG-OX) 400 MG tablet Take 400 mg by mouth every morning.     . Multiple Vitamins-Minerals (MULTIVITAMIN ADULT PO) Take 1 tablet by mouth daily.    Marland Kitchen omega-3 acid ethyl esters (LOVAZA) 1 G capsule Take 1 g by mouth every morning.     Marland Kitchen Spirulina 500 MG TABS Take 500 mg by mouth every morning.     . TURMERIC PO Take 1 Dose by mouth every morning.     . Zinc 15 MG CAPS Take 1 capsule by mouth.    Marland Kitchen PROTEIN PO Take 1 tablet by mouth daily.      No results found for this or any previous visit (from the past 48  hour(s)). No results found.  Review of Systems  Constitutional: Negative.   HENT: Negative.   Eyes: Negative.   Respiratory: Negative.   Cardiovascular: Negative.   Gastrointestinal: Negative.   Genitourinary: Negative.   Musculoskeletal: Negative.   Skin: Negative.   Neurological: Negative.   Psychiatric/Behavioral: Negative.     Blood pressure 118/65, pulse 73, temperature 98.7 F (37.1 C), temperature source Oral, resp. rate 18, height 5\' 6"  (1.676 m), weight 102.513 kg (226 lb), SpO2 98 %. Physical Exam  Constitutional: She is oriented to person, place, and time. She appears well-developed and well-nourished.  HENT:  Head: Normocephalic and atraumatic.  Eyes: Conjunctivae and EOM are normal. Pupils are equal, round, and reactive to light.  Cardiovascular: Normal rate.   Respiratory: Effort normal.  GI: Soft.  Neurological: She is alert and oriented to person, place, and time.  Skin: Skin is warm.  Psychiatric: She has a normal mood and affect. Her behavior is normal. Judgment and thought content normal.     Assessment/Plan Left ankle wound debridement with acell placement.  Alicia Schroeder 03/27/2015, 2:20 PM

## 2015-03-27 NOTE — Op Note (Signed)
Operative Note   DATE OF OPERATION: 03/27/2015  LOCATION: [facility]  SURGICAL DIVISION: Plastic Surgery  PREOPERATIVE DIAGNOSES:  Left ankle wound 2 x 2 x 1.5 cm  POSTOPERATIVE DIAGNOSES:  same  PROCEDURE:  Preparation of left ankle wound for placement of Acell (powder 500 mg, 5 x 5 cm)  SURGEON: [employer], DO  ASSISTANT: Shawn Rayburn, PA  ANESTHESIA:  General.   COMPLICATIONS: None.   INDICATIONS FOR PROCEDURE:  The patient, Alicia Schroeder is a 64 y.o. female born on 1951/05/13, is here for treatment of left ankle wound from trauma several months ago with a fracture that is now repaired. MRN: [id number]  CONSENT:  Informed consent was obtained directly from the patient. Risks, benefits and alternatives were fully discussed. Specific risks including but not limited to bleeding, infection, hematoma, seroma, scarring, pain, infection, contracture, asymmetry, wound healing problems, and need for further surgery were all discussed. The patient did have an ample opportunity to have questions answered to satisfaction.   DESCRIPTION OF PROCEDURE:  The patient was taken to the operating room. SCDs were placed and IV antibiotics were given. The patient's operative site was prepped and draped in a sterile fashion. A time out was performed and all information was confirmed to be correct.  General anesthesia was administered.  The curette was initially used and the the #15 blade to debride the area including skin, subcutaneous tissue and tendon. Hemostasis was achieved with pressure and bovie.  The area was irrigated with antibiotic solution.  The Acell powder 500 mg and 50 % of the sheet were applied and adaptic secured with 5-0 Vicryl.  Surgical gel was placed with a kerlex and ace wrap. The patient tolerated the procedure well.  There were no complications. The patient was allowed to wake from anesthesia, extubated and taken to the recovery room in satisfactory  condition.

## 2015-03-27 NOTE — Transfer of Care (Signed)
Immediate Anesthesia Transfer of Care Note  Patient: Alicia Schroeder  Procedure(s) Performed: Procedure(s): IRRIGATION AND DEBRIDEMENT LEFT ANKLE WITH PLACEMENT OF ACELL (Left) APPLICATION OF A-CELL OF EXTREMITY (Left)  Patient Location: PACU  Anesthesia Type:General  Level of Consciousness: awake, alert  and oriented  Airway & Oxygen Therapy: Patient Spontanous Breathing and Patient connected to face mask oxygen  Post-op Assessment: Report given to RN and Post -op Vital signs reviewed and stable  Post vital signs: Reviewed and stable  Last Vitals:  Filed Vitals:   03/27/15 1304  BP: 118/65  Pulse: 73  Temp: 37.1 C  Resp: 18    Complications: No apparent anesthesia complications

## 2015-03-27 NOTE — Anesthesia Procedure Notes (Signed)
Procedure Name: LMA Insertion Date/Time: 03/27/2015 2:41 PM Performed by: Melynda Ripple D Pre-anesthesia Checklist: Patient identified, Emergency Drugs available, Suction available and Patient being monitored Patient Re-evaluated:Patient Re-evaluated prior to inductionOxygen Delivery Method: Circle System Utilized Preoxygenation: Pre-oxygenation with 100% oxygen Intubation Type: IV induction Ventilation: Mask ventilation without difficulty LMA: LMA inserted LMA Size: 4.0 Number of attempts: 1 Airway Equipment and Method: Bite block Placement Confirmation: positive ETCO2 and breath sounds checked- equal and bilateral Tube secured with: Tape Dental Injury: Teeth and Oropharynx as per pre-operative assessment

## 2015-03-27 NOTE — Anesthesia Postprocedure Evaluation (Signed)
Anesthesia Post Note  Patient: Alicia Schroeder  Procedure(s) Performed: Procedure(s) (LRB): IRRIGATION AND DEBRIDEMENT LEFT ANKLE  (Left) APPLICATION OF A-CELL OF EXTREMITY (Left)  Anesthesia type: General  Patient location: PACU  Post pain: Pain level controlled  Post assessment: Post-op Vital signs reviewed  Last Vitals: BP 138/68 mmHg  Pulse 72  Temp(Src) 36.9 C (Oral)  Resp 16  Ht 5\' 6"  (1.676 m)  Wt 226 lb (102.513 kg)  BMI 36.49 kg/m2  SpO2 100%  Post vital signs: Reviewed  Level of consciousness: sedated  Complications: No apparent anesthesia complications

## 2015-03-27 NOTE — Brief Op Note (Signed)
03/27/2015  3:08 PM  PATIENT:  Lillia Pauls  64 y.o. female  PRE-OPERATIVE DIAGNOSIS:  LEFT ANKLE WOUND  POST-OPERATIVE DIAGNOSIS:  Left ankle wound  PROCEDURE:  Procedure(s): IRRIGATION AND DEBRIDEMENT LEFT ANKLE WITH PLACEMENT OF ACELL (Left) APPLICATION OF A-CELL OF EXTREMITY (Left)  SURGEON:  Surgeon(s) and Role:    * Loel Lofty Jeno Calleros, DO - Primary  PHYSICIAN ASSISTANT: Shawn Rayburn, PA  ASSISTANTS: none   ANESTHESIA:   general  EBL:  Total I/O In: 500 [I.V.:500] Out: -   BLOOD ADMINISTERED:none  DRAINS: none   LOCAL MEDICATIONS USED:  LIDOCAINE   SPECIMEN:  No Specimen  DISPOSITION OF SPECIMEN:  N/A  COUNTS:  YES  TOURNIQUET:    DICTATION: .Dragon Dictation  PLAN OF CARE: Discharge to home after PACU  PATIENT DISPOSITION:  PACU - hemodynamically stable.   Delay start of Pharmacological VTE agent (>24hrs) due to surgical blood loss or risk of bleeding: no

## 2015-03-27 NOTE — Anesthesia Preprocedure Evaluation (Addendum)
Anesthesia Evaluation  Patient identified by MRN, date of birth, ID band Patient awake    Reviewed: Allergy & Precautions, NPO status , Patient's Chart, lab work & pertinent test results  History of Anesthesia Complications Negative for: history of anesthetic complications ("sister in law" has had a stroke after anesthesia but no direct family hx of complications with anesthesia)  Airway Mallampati: III  TM Distance: >3 FB Neck ROM: Full    Dental no notable dental hx. (+) Dental Advisory Given   Pulmonary neg pulmonary ROS,    Pulmonary exam normal breath sounds clear to auscultation       Cardiovascular hypertension, Pt. on medications Normal cardiovascular exam Rhythm:Regular Rate:Normal  Last cardiology note reviewed.  ECHO 2015: Left ventricle: The cavity size was normal. Systolic function was normal. The estimated ejection fraction was in the range of 60% to 65%. Wall motion was normal; there were no regional wall motion abnormalities. Doppler parameters are consistent with abnormal left ventricular relaxation (grade 1 diastolic dysfunction).    Neuro/Psych PSYCHIATRIC DISORDERS Anxiety Depression negative neurological ROS     GI/Hepatic negative GI ROS, Neg liver ROS,   Endo/Other  obesity  Renal/GU negative Renal ROS  negative genitourinary   Musculoskeletal  (+) Arthritis ,   Abdominal (+) + obese,   Peds negative pediatric ROS (+)  Hematology negative hematology ROS (+)   Anesthesia Other Findings   Reproductive/Obstetrics negative OB ROS                          Anesthesia Physical Anesthesia Plan  ASA: II  Anesthesia Plan: General   Post-op Pain Management:    Induction: Intravenous  Airway Management Planned: LMA  Additional Equipment:   Intra-op Plan:   Post-operative Plan: Extubation in OR  Informed Consent: I have reviewed the patients History  and Physical, chart, labs and discussed the procedure including the risks, benefits and alternatives for the proposed anesthesia with the patient or authorized representative who has indicated his/her understanding and acceptance.   Dental advisory given  Plan Discussed with: CRNA  Anesthesia Plan Comments: (Patient was very anxious about anesthesia given that her sister in law had a stroke during anesthesia. I discussed the risk of stroke and risk factors associated with it. I also discussed the risks of general anesthesia including but not limited to respiratory, cardiac, dental injury, nerve injury, and/or allergic reaction. I gave her the option for a spinal anesthetic since she has had this before without difficulty and she was nervous about the general anesthetic but she chose to have a general anesthetic. I discussed that we would attempt to use an LMA but if it did not insert adequately that an ETT would be used. 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 I addressed all of her concerns and answered all of her questions. She was agreeable to the anesthetic plan and understanding of any changes to this plan that may need to occur. She appeared pleased with her anesthetic plan, care, and discussion. )      Anesthesia Quick Evaluation

## 2015-03-27 NOTE — Discharge Instructions (Signed)
Apply KY gel to the wound daily starting on Wednesday.  Do not get the area wet.   Post Anesthesia Home Care Instructions  Activity: Get plenty of rest for the remainder of the day. A responsible adult should stay with you for 24 hours following the procedure.  For the next 24 hours, DO NOT: -Drive a car -Paediatric nurse -Drink alcoholic beverages -Take any medication unless instructed by your physician -Make any legal decisions or sign important papers.  Meals: Start with liquid foods such as gelatin or soup. Progress to regular foods as tolerated. Avoid greasy, spicy, heavy foods. If nausea and/or vomiting occur, drink only clear liquids until the nausea and/or vomiting subsides. Call your physician if vomiting continues.  Special Instructions/Symptoms: Your throat may feel dry or sore from the anesthesia or the breathing tube placed in your throat during surgery. If this causes discomfort, gargle with warm salt water. The discomfort should disappear within 24 hours.  If you had a scopolamine patch placed behind your ear for the management of post- operative nausea and/or vomiting:  1. The medication in the patch is effective for 72 hours, after which it should be removed.  Wrap patch in a tissue and discard in the trash. Wash hands thoroughly with soap and water. 2. You may remove the patch earlier than 72 hours if you experience unpleasant side effects which may include dry mouth, dizziness or visual disturbances. 3. Avoid touching the patch. Wash your hands with soap and water after contact with the patch.

## 2015-03-28 ENCOUNTER — Encounter (HOSPITAL_BASED_OUTPATIENT_CLINIC_OR_DEPARTMENT_OTHER): Payer: Self-pay | Admitting: Plastic Surgery

## 2015-04-03 ENCOUNTER — Encounter (HOSPITAL_BASED_OUTPATIENT_CLINIC_OR_DEPARTMENT_OTHER): Payer: BLUE CROSS/BLUE SHIELD | Attending: Plastic Surgery

## 2015-04-03 DIAGNOSIS — Z8781 Personal history of (healed) traumatic fracture: Secondary | ICD-10-CM | POA: Insufficient documentation

## 2015-04-03 DIAGNOSIS — Y838 Other surgical procedures as the cause of abnormal reaction of the patient, or of later complication, without mention of misadventure at the time of the procedure: Secondary | ICD-10-CM | POA: Diagnosis not present

## 2015-04-03 DIAGNOSIS — M1990 Unspecified osteoarthritis, unspecified site: Secondary | ICD-10-CM | POA: Diagnosis not present

## 2015-04-03 DIAGNOSIS — T8189XA Other complications of procedures, not elsewhere classified, initial encounter: Secondary | ICD-10-CM | POA: Insufficient documentation

## 2015-04-03 DIAGNOSIS — E785 Hyperlipidemia, unspecified: Secondary | ICD-10-CM | POA: Insufficient documentation

## 2015-04-10 DIAGNOSIS — T8189XA Other complications of procedures, not elsewhere classified, initial encounter: Secondary | ICD-10-CM | POA: Diagnosis not present

## 2015-04-10 DIAGNOSIS — M1990 Unspecified osteoarthritis, unspecified site: Secondary | ICD-10-CM | POA: Diagnosis not present

## 2015-04-10 DIAGNOSIS — Z8781 Personal history of (healed) traumatic fracture: Secondary | ICD-10-CM | POA: Diagnosis not present

## 2015-04-10 DIAGNOSIS — E785 Hyperlipidemia, unspecified: Secondary | ICD-10-CM | POA: Diagnosis not present

## 2015-04-24 ENCOUNTER — Encounter (HOSPITAL_BASED_OUTPATIENT_CLINIC_OR_DEPARTMENT_OTHER): Payer: BLUE CROSS/BLUE SHIELD | Attending: Plastic Surgery

## 2015-06-29 ENCOUNTER — Ambulatory Visit
Admission: RE | Admit: 2015-06-29 | Discharge: 2015-06-29 | Disposition: A | Discharge status: Home / Self Care | Payer: PRIVATE HEALTH INSURANCE | Source: Ambulatory Visit | Attending: Family Medicine | Admitting: Family Medicine

## 2015-06-29 ENCOUNTER — Other Ambulatory Visit: Payer: Self-pay | Admitting: Family Medicine

## 2015-06-29 DIAGNOSIS — R0989 Other specified symptoms and signs involving the circulatory and respiratory systems: Secondary | ICD-10-CM

## 2015-08-24 ENCOUNTER — Other Ambulatory Visit: Payer: Self-pay

## 2015-08-24 ENCOUNTER — Telehealth: Payer: Self-pay

## 2015-08-24 MED ORDER — ACEBUTOLOL HCL 200 MG PO CAPS: ORAL_CAPSULE | ORAL | Status: DC

## 2015-08-24 NOTE — Telephone Encounter (Signed)
Patient flew to [place] from [place] and forgot medication. Sending in temporary supply of acebutolol (SECTRAL) 200 MG capsule TAKE 1 CAPSULE BY MOUTH 2 TIMES DAILy  Leonie Man, MD at 03/16/2015 1:22 PM  acebutolol (SECTRAL) 200 MG capsule TAKE 1 CAPSULE BY MOUTH 2 TIMES DAILY ASSESSMENT / PLAN:  Problem List Items Addressed This Visit   PVC's (premature ventricular contractions) (Chronic) Significantly improved after reducing caffeine intake and stable dose of acebutolol.  Patient Instructions     NO CHANGES IN CURRENT MEDICATIONS

## 2015-09-19 ENCOUNTER — Other Ambulatory Visit: Payer: Self-pay | Admitting: Cardiology

## 2015-09-19 NOTE — Telephone Encounter (Signed)
Rx request sent to pharmacy.  

## 2015-09-20 ENCOUNTER — Encounter: Payer: Self-pay | Admitting: Internal Medicine

## 2015-09-20 ENCOUNTER — Ambulatory Visit (INDEPENDENT_AMBULATORY_CARE_PROVIDER_SITE_OTHER): Payer: PRIVATE HEALTH INSURANCE | Admitting: Internal Medicine

## 2015-09-20 DIAGNOSIS — M810 Age-related osteoporosis without current pathological fracture: Secondary | ICD-10-CM | POA: Insufficient documentation

## 2015-09-20 DIAGNOSIS — E559 Vitamin D deficiency, unspecified: Secondary | ICD-10-CM

## 2015-09-20 DIAGNOSIS — M8580 Other specified disorders of bone density and structure, unspecified site: Secondary | ICD-10-CM

## 2015-09-20 DIAGNOSIS — E210 Primary hyperparathyroidism: Secondary | ICD-10-CM | POA: Insufficient documentation

## 2015-09-20 LAB — VITAMIN D 25 HYDROXY (VIT D DEFICIENCY, FRACTURES): VITD: 20.62 ng/mL — AB (ref 30.00–100.00)

## 2015-09-20 NOTE — Progress Notes (Signed)
Patient ID: Alicia Schroeder, female   DOB: Nov 26, 1950, 65 y.o.   MRN: [id number]   HPI  Alicia Schroeder is a 65 y.o.-year-old female, referred by her PCP, Dr. Stephanie Acre, for management of osteopenia (Op).  Pt was dx with Op in 06/2015.  I reviewed pt's DEXA scans: Date L1-L4 T score FN T score Ultra distal radius  33% distal Radius FRAX score   07/04/2015  +2.0  RFN: -1.0 L: -1.1 L: -0.9  MOF: 14%  Hip fracture risk: 0.5%   Has L THR.   She denies falls but had: - Left ankle trimalleolar fx - after falling through a rotten deck (11/2014) >> had surgery - clavicle fracture - after pushing a walker (12/15/2014) She also lost more than 4 cm in height from her maximum height.  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  No dizziness/vertigo/orthostasis/poor vision. She has some imbalance from her fx 'ed ankle >> fell recently >> did not have a fracture.  She has not been on any OP treatments in the past.  + h/o vitamin D insufficiency >> started 4000 units daily - latest level normal. 30.3 on 06/05/2015 23.9 in 04/2014 She eats dairy and green, leafy, vegetables.   No weight bearing exercises.   She does not take high vitamin A doses.  No h/o kidney stones.  She had several courses of steroid in the past, not recently. Last: Shingles - last 3 years.  Menopause was at 65 y/o.   Pt does have a FH of osteoporosis - hip fx in mother.  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 Reviewed TSH recent levels:  06/05/2015: TSH 2.95 05/05/2014: TSH 2.33  No h/o CKD. Last BUN/Cr: Lab Results  Component Value Date   BUN 16 12/26/2014   CREATININE 0.63 12/26/2014    ROS: Constitutional: + weight gain, +  fatigue, + Hot flashes, + poor sleep Eyes: no blurry vision, no xerophthalmia ENT: no sore throat, no nodules palpated in throat, no dysphagia/odynophagia, no hoarseness, + tinnitus Cardiovascular: no CP/SOB/palpitations/leg swelling Respiratory: no cough/SOB Gastrointestinal: no N/V/D/C Musculoskeletal: + Both: muscle/joint aches Skin: no rashes, + hair loss Neurological: no tremors/numbness/tingling/dizziness Psychiatric: + depression/no anxiety  Past Medical History  Diagnosis Date  . Hyperlipidemia   . Hypertension   . Arthritis      right greater the left hip  . Abnormal ECG   . Family history of premature coronary artery disease     Father with extensive coronary and carotid disease  . Family history of anesthesia complication     sisterin law disabled from anesthesia   . Premature ventricular contractions   . Dysrhythmia     has soft Systolic Ejection Murmur  . Aortic valve sclerosis     no evidence per patient   . Pneumonia     15 years ago approximately   . Cancer ([place])     basal cell carcinoma removed- 24 years ago   . Depression     no meds   Past Surgical History  Procedure Laterality Date  . Transthoracic echocardiogram  08/05/2013    Normal LV size and function. EF 60-65%. No regional WMA; grade 1 diastolic dysfunction with mild LA dilation; no significant valvular lesions -- mild  aortic valve calcification/sclerosis (could explain a soft systolic murmur)  . Nm lexiscan myoview ltd  08/03/2013    Normal study. No ischemia or infarction. Normal wall motion. EF 71%  . Total hip arthroplasty Left 09/29/2013    Procedure: LEFT TOTAL HIP ARTHROPLASTY ANTERIOR APPROACH;  Surgeon: Gearlean Alf, MD;  Location: [facility];  Service: Orthopedics;  Laterality: Left;  . Joint replacement Left   . Ankle fracture surgery Left 12-09-14  . I&d extremity Left 03/27/2015    Procedure: IRRIGATION AND DEBRIDEMENT LEFT ANKLE ;  Surgeon: Wallace Going, DO;  Location: [facility];  Service: Plastics;  Laterality: Left;  . Application of a-cell of extremity Left 03/27/2015    Procedure: APPLICATION OF A-CELL OF EXTREMITY;  Surgeon: Wallace Going, DO;  Location: [facility];  Service: Plastics;  Laterality: Left;   Social History   Social History  . Marital Status: Divorced    Spouse Name: N/A  . Number of Children: 2 (7 miscarriages)    Social History Main Topics  . Smoking status: Never Smoker   . Smokeless tobacco: Never Used  . Alcohol Use: Yes     Comment: occasional   . Drug Use: No   Social History Narrative   She is a divorced mother of 2. She's been divorced for 10 years. She does long with her dog. 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    She's never smoked. Does not drink alcohol.      Phone [phone]   Children: Alicia Schroeder, [place] - [phone]); Alicia Schro[place], [place] [phone] since about.    Current Outpatient Prescriptions on File Prior to Visit  Medication Sig Dispense Refill  . acebutolol (SECTRAL) 200 MG capsule TAKE 1 CAPSULE BY MOUTH 2 TIMES DAILY. 60 capsule 6  . Ascorbic Acid (VITAMIN C) 1000 MG tablet Take 1,000 mg by mouth daily.    Marland Kitchen aspirin EC 325 MG tablet Take 1 tablet (325 mg total) by mouth daily. 42 tablet 0  . atorvastatin (LIPITOR) 20 MG tablet Take 20 mg by mouth daily at 6 PM. Patient takes in pm    . calcium-vitamin D 250-100 MG-UNIT per tablet Take 2 tablets by mouth every morning.     . celecoxib (CELEBREX) 200 MG capsule Take 1 capsule (200 mg total) by mouth daily. 30 capsule 2  . cetirizine (ZYRTEC) 10 MG tablet Take 10 mg by mouth every evening.     . Cholecalciferol (VITAMIN D3) 2000 UNITS TABS Take 2,000 Units by mouth every morning.     Marland Kitchen HYDROcodone-acetaminophen (NORCO) 5-325 MG tablet Take 1 tablet by mouth every 6 (six) hours as needed for moderate pain. 30 tablet 0  . lisinopril (PRINIVIL,ZESTRIL) 20 MG tablet Take 20 mg  by mouth every evening.     . magnesium oxide (MAG-OX) 400 MG tablet Take 400 mg by mouth every morning.     . Multiple Vitamins-Minerals (MULTIVITAMIN ADULT PO) Take 1 tablet by mouth daily.    Marland Kitchen omega-3 acid ethyl esters (LOVAZA) 1 G capsule Take 1 g by mouth every morning.     Marland Kitchen PROTEIN PO Take 1 tablet by mouth daily.    Marland Kitchen Spirulina 500 MG TABS Take 500 mg by mouth every morning.     . TURMERIC PO Take 1 Dose by mouth every morning.      No current facility-administered medications on file prior to visit.   No Known Allergies  Family History  Problem Relation Age of Onset  . Parkinson's disease Mother   . Thyroid disease Mother   . Hypertension Mother   . Heart failure Mother   . Hypertension Father   . Hyperlipidemia Father   . Heart disease Father 20    Triple bypass.   . Kidney failure Father   . Stroke Maternal Grandmother   . Transient ischemic attack Maternal Grandmother   . Arrhythmia Maternal Grandfather     Pacemaker  . Heart disease Paternal Grandmother   . Parkinson's disease Paternal Grandfather   . Hypertension Paternal Grandfather     PE: BP 114/68 mmHg  Pulse 73  Temp(Src) 98.7 F (37.1 C) (Oral)  Resp 12  Ht 5' 4.25" (1.632 m)  Wt 238 lb (107.956 kg)  BMI 40.53 kg/m2  SpO2 95% Wt Readings from Last 3 Encounters:  09/20/15 238 lb (107.956 kg)  03/27/15 226 lb (102.513 kg)  03/16/15 230 lb 11.2 oz (104.645 kg)   Constitutional: Obese, in NAD. No kyphosis. Eyes: PERRLA, EOMI, no exophthalmos ENT: moist mucous membranes, no thyromegaly, no cervical lymphadenopathy Cardiovascular: RRR, No MRG Respiratory: CTA B Gastrointestinal: abdomen soft, NT, ND, BS+ Musculoskeletal: no deformities, strength intact in all 4; no pain at spine percussion Skin: moist, warm, no rashes Neurological: no tremor with outstretched hands, DTR normal in all 4  Assessment: 1. Osteoporosis  Plan: 1. Osteopenia - Possibly related to early menopause (at 65 years old).  She also has family history of osteoporosis - We reviewed her DEXA scan report together, and I explained that based on the T scores, she has a slightly increased risk for fractures. Her T-scores are in the high osteopenic range. However, since she already had 2 fractures, she can be considered at higher fracture risk based on clinical history. - we reviewed her dietary and supplemental calcium and vitamin D intake. I recommended to make sure she gets approximately 600 mg of calcium daily (since she has slight hypercalcemia) and I will check vit D today to see if she needs supplementation - given her specific instructions about food sources for Calcium and Vitamin D - see pt instructions  - discussed fall precautions   - given handout from [facility] Re: weight bearing exercises - advised to do this every day or at least 5/7 days - She appears to maintain a good amount of protein in her diet. The recommended daily protein intake is ~0.8 g per kilogram per day. She is not smoking or drinking >2 drinks of alcohol a day. - We discussed about the different medication classes, however, I suggested to hold off medicines for now until we can clarify her hypercalcemia cause. She completely agrees with this. If we need to start anti-resorptive medication, I would probably suggest once a week Fosamax initially.  - Due to clinical history, I doubt that she has fibrous dysplasia  2. Hypercalcemia - She had several calcium levels were slightly higher than the upper limit of normal - She has a history of vitamin D insufficiency, but a normal vitamin D per last check in 05/2015. She had a slightly higher PTH level than expected. - She is getting calcium from several supplements and I advised her to try to get no more than 600 mg of calcium a day - We will check a vitamin D level at this time, and proceed with further investigation of her hypercalcemia if this returns normal - No apparent  complications from hypercalcemia: no h/o nephrolithiasis, no  osteoporosis, but she does have a history of fractures - I discussed with the patient about the physiology of calcium and parathyroid hormone, and possible side effects from increased PTH, including kidney stones, osteoporosis (or 33% distal radius BMD was not lower than the rest of the BMD sites), abdominal pain, etc.  - We discussed that we need to check whether her high PTH (than expected) is due to a primary cause (Familial hypercalcemic hypocalciuria or parathyroid adenoma) or a secondary cause (to conditions like: vitamin D deficiency, calcium malabsorption, hypercalciuria, renal insufficiency, etc.). - I discussed with her that we first need to bring her vitamin D level to normal so we can further investigate the parathyroid status. I explained that in the setting of a low vitamin D, the parathyroid hormone can be elevated, which is not a pathologic finding. However, if the PTH is elevated in the setting of a normal vitamin D, we will further need to investigate her for primary or secondary hyperparathyroidism. - after we normalize the vitamin D level, we'll need to check: calcium level intact PTH ([facility]) Magnesium Phosphorus vitamin D- 25 HO and 1,25 HO 24h urinary calcium/creatinine ratio - given instructions for urine collection - We discussed possible consequences of hyperparathyroidism: ~1/3 pts will develop complications over 15 years (OP, nephrolithiasis).  - If the tests indicate a parathyroid adenoma, although her degree of hypercalcemia is very mild, she may benefit from parathyroidectomy due to the previous history of fractures - criteria for parathyroid surgery are:  Increased calcium by more than 1 mg/dL above the upper limit of normal  Kidney ds.  Osteoporosis (or Vb fx) Age <15 years old + Newer criteria (2013): High UCa >400 mg/d and increased stone risk by biochemical stone risk analysis Presence of  nephrolithiasis or nephrocalcinosis Pt's preference!  - I advised the patient to continue her current vitamin D supplementation for now. I will advise her about vitamin D supplement dose when the results of the vitamin D level are back. - I will see the patient back in 4 months  - time spent with the patient: 1 hour, of which >50% was spent in obtaining information about her symptoms, reviewing her previous labs, evaluations, and treatments, counseling her about her condition (please see the discussed topics above), and developing a plan to further investigate it; she had a number of questions which I addressed.   Component     Latest Ref Rng 09/20/2015  VITD     30.00 - 100.00 ng/mL 20.62 (L)  Vitamin D is low >> will increase vitamin D supplement to 6000 units daily. We'll repeat the vitamin D evaluation in 2 months.

## 2015-09-20 NOTE — Patient Instructions (Addendum)
Please stop at the lab.  We will need a 24h urine collection if the vitamin D level is normal.  Patient information (Up-to-Date): Collection of a 24-hour urine specimen   - You should collect every drop of urine during each 24-hour period. It does not matter how much or little urine is passed each time, as long as every drop is collected. - Begin the urine collection in the morning after you wake up, after you have emptied your bladder for the first time. - Urinate (empty the bladder) for the first time and flush it down the toilet. Note the exact time (eg, 6:15 AM). You will begin the urine collection at this time. - Collect every drop of urine during the day and night in an empty collection bottle. Store the bottle at room temperature or in the refrigerator. - If you need to have a bowel movement, any urine passed with the bowel movement should be collected. Try not to include feces with the urine collection. If feces does get mixed in, do not try to remove the feces from the urine collection bottle. - Finish by collecting the first urine passed the next morning, adding it to the collection bottle. This should be within ten minutes before or after the time of the first morning void on the first day (which was flushed). In this example, you would try to void between 6:05 and 6:25 on the second day. - If you need to urinate one hour before the final collection time, drink a full glass of water so that you can void again at the appropriate time. If you have to urinate 20 minutes before, try to hold the urine until the proper time. - Please note the exact time of the final collection, even if it is not the same time as when collection began on day 1. - The bottle(s) may be kept at room temperature for a day or two, but should be kept cool or refrigerated for longer periods of time.  Please get ~600 mg calcium from diet daily.  How Can I Prevent Falls? Men and women with osteoporosis need to take  care not to fall down. Falls can break bones. Some reasons people fall are: Poor vision  Poor balance  Certain diseases that affect how you walk  Some types of medicine, such as sleeping pills.  Some tips to help prevent falls outdoors are: Use a cane or walker  Wear rubber-soled shoes so you don't slip  Walk on grass when sidewalks are slippery  In winter, put salt or kitty litter on icy sidewalks.  Some ways to help prevent falls indoors are: Keep rooms free of clutter, especially on floors  Use plastic or carpet runners on slippery floors  Wear low-heeled shoes that provide good support  Do not walk in socks, stockings, or slippers  Be sure carpets and area rugs have skid-proof backs or are tacked to the floor  Be sure stairs are well lit and have rails on both sides  Put grab bars on bathroom walls near tub, shower, and toilet  Use a rubber bath mat in the shower or tub  Keep a flashlight next to your bed  Use a sturdy step stool with a handrail and wide steps  Add more lights in rooms (and night lights) Buy a cordless phone to keep with you so that you don't have to rush to the phone       when it rings and so that you can call  for help if you fall.   (adapted from http://www.niams.[employer]ess[employer].at)  Dietary sources of calcium and vitamin D:  Calcium content (mg) - http://www.niams.https://www.gonzalez.org/  Fortified oatmeal, 1 packet 350  Sardines, canned in oil, with edible bones, 3 oz. 324  Cheddar cheese, 1 oz. shredded 306  Milk, nonfat, 1 cup 302  Milkshake, 1 cup 300  Yogurt, plain, low-fat, 1 cup 300  Soybeans, cooked, 1 cup 261  Tofu, firm, with calcium,  cup 204  Orange juice, fortified with calcium, 6 oz. 200-260 (varies)  Salmon, canned, with edible bones, 3 oz. 181  Pudding, instant, made with 2% milk,  cup 153  Baked beans, 1 cup 142  Cottage cheese, 1% milk fat, 1 cup 138  Spaghetti, lasagna, 1 cup  125  Frozen yogurt, vanilla, soft-serve,  cup 103  Ready-to-eat cereal, fortified with calcium, 1 cup 100-1,000 (varies)  Cheese pizza, 1 slice 100  Fortified waffles, 2 100  Turnip greens, boiled,  cup 99  Broccoli, raw, 1 cup 90  Ice cream, vanilla,  cup 85  Soy or rice milk, fortified with calcium, 1 cup 80-500 (varies)   Vitamin D content (International Units, IU) - https://www.ars.usda.gov Cod liver oil, 1 tablespoon 1,360  Swordfish, cooked, 3 oz 566  Salmon (sockeye), cooked, 3 oz 447  Tuna fish, canned in water, drained, 3 oz 154  Orange juice fortified with vitamin D, 1 cup (check product labels, as amount of added vitamin D varies) 137  Milk, nonfat, reduced fat, and whole, vitamin D-fortified, 1 cup 115-124  Yogurt, fortified with 20% of the daily value for vitamin D, 6 oz 80  Margarine, fortified, 1 tablespoon 60  Sardines, canned in oil, drained, 2 sardines 46  Liver, beef, cooked, 3 oz 42  Egg, 1 large (vitamin D is found in yolk) 41  Ready-to-eat cereal, fortified with 10% of the daily value for vitamin D, 0.75-1 cup  40  Cheese, Swiss, 1 oz 6    Exercise for Strong Bones (from [employer]) There are two types of exercises that are important for building and maintaining bone density:  weight-bearing and muscle-strengthening exercises. Weight-bearing Exercises These exercises include activities that make you move against gravity while staying upright. Weight-bearing exercises can be high-impact or low-impact. High-impact weight-bearing exercises help build bones and keep them strong. If you have broken a bone due to osteoporosis or are at risk of breaking a bone, you may need to avoid high-impact exercises. If you're not sure, you should check with your healthcare provider. Examples of high-impact weight-bearing exercises are: . Dancing . Doing high-impact aerobics . Hiking . Jogging/running . Jumping Rope . Stair  climbing . Tennis Low-impact weight-bearing exercises can also help keep bones strong and are a safe alternative if you cannot do high-impact exercises. Examples of low-impact weight-bearing exercises are: . Using elliptical training machines . Doing low-impact aerobics . Using stair-step machines . Fast walking on a treadmill or outside Muscle-Strengthening Exercises These exercises include activities where you move your body, a weight or some other resistance against gravity. They are also known as resistance exercises and include: . Lifting weights . Using elastic exercise bands . Using weight machines . Lifting your own body weight . Functional movements, such as standing and rising up on your toes Yoga and Pilates can also improve strength, balance and flexibility. However, certain positions may not be safe for people with osteoporosis or those at increased risk of broken bones. For example, exercises that have you bend forward may  increase the chance of breaking a bone in the spine. A physical therapist should be able to help you learn which exercises are safe and appropriate for you. Non-Impact Exercises Non-impact exercises can help you to improve balance, posture and how well you move in everyday activities. These exercises can also help to increase muscle strength and decrease the risk of falls and broken bones. Some of these exercises include: . Balance exercises that strengthen your legs and test your balance, such as Tai Chi, can decrease your risk of falls. . Posture exercises that improve your posture and reduce rounded or "sloping" shoulders can help you decrease the chance of breaking a bone, especially in the spine. . Functional exercises that improve how well you move can help you with everyday activities and decrease your chance of falling and breaking a bone. For example, if you have trouble getting up from a chair or climbing stairs, you should do these activities as  exercises. A physical therapist can teach you balance, posture and functional exercises. Starting a New Exercise Program If you haven't exercised regularly for a while, check with your healthcare provider before beginning a new exercise program-particularly if you have health problems such as heart disease, diabetes or high blood pressure. If you're at high risk of breaking a bone, you should work with a physical therapist to develop a safe exercise program. Once you have your healthcare provider's approval, start slowly. If you've already broken bones in the spine because of osteoporosis, be very careful to avoid activities that require reaching down, bending forward, rapid twisting motions, heavy lifting and those that increase your chance of a fall. As you get started, your muscles may feel sore for a day or two after you exercise. If soreness lasts longer, you may be working too hard and need to ease up. Exercises should be done in a pain-free range of motion. How Much Exercise Do You Need? Weight-bearing exercises 30 minutes on most days of the week. Do a 30-minutesession or multiple sessions spread out throughout the day. The benefits to your bones are the same.   Muscle-strengthening exercises Two to three days per week. If you don't have much time for strengthening/resistance training, do small amounts at a time. You can do just one body part each day. For example do arms one day, legs the next and trunk the next. You can also spread these exercises out during your normal day.  Balance, posture and functional exercises Every day or as often as needed. You may want to focus on one area more than the others. If you have fallen or lose your balance, spend time doing balance exercises. If you are getting rounded shoulders, work more on posture exercises. If you have trouble climbing stairs or getting up from the couch, do more functional exercises. You can also perform these exercises at one time or  spread them during your day. Work with a phyiscal therapist to learn the right exercises for you.

## 2015-11-16 ENCOUNTER — Other Ambulatory Visit: Payer: Self-pay

## 2015-11-16 MED ORDER — ACEBUTOLOL HCL 200 MG PO CAPS: 200.0000 mg | ORAL_CAPSULE | Freq: Every day | ORAL | Status: DC

## 2015-11-22 ENCOUNTER — Other Ambulatory Visit (INDEPENDENT_AMBULATORY_CARE_PROVIDER_SITE_OTHER): Payer: PRIVATE HEALTH INSURANCE

## 2015-11-22 ENCOUNTER — Other Ambulatory Visit: Payer: Self-pay

## 2015-11-22 DIAGNOSIS — E559 Vitamin D deficiency, unspecified: Secondary | ICD-10-CM

## 2015-11-22 LAB — VITAMIN D 25 HYDROXY (VIT D DEFICIENCY, FRACTURES): VITD: 28.15 ng/mL — AB (ref 30.00–100.00)

## 2015-11-23 ENCOUNTER — Other Ambulatory Visit: Payer: Self-pay | Admitting: Internal Medicine

## 2015-11-27 ENCOUNTER — Other Ambulatory Visit: Payer: Self-pay | Admitting: Cardiology

## 2015-11-27 MED ORDER — ACEBUTOLOL HCL 200 MG PO CAPS: 200.0000 mg | ORAL_CAPSULE | Freq: Two times a day (BID) | ORAL | Status: DC

## 2015-11-27 NOTE — Telephone Encounter (Signed)
Rx request sent to pharmacy.  

## 2015-12-01 ENCOUNTER — Other Ambulatory Visit: Payer: Self-pay

## 2015-12-01 ENCOUNTER — Other Ambulatory Visit: Payer: Self-pay | Admitting: Internal Medicine

## 2015-12-01 ENCOUNTER — Other Ambulatory Visit: Payer: PRIVATE HEALTH INSURANCE

## 2015-12-01 LAB — BASIC METABOLIC PANEL WITH GFR
BUN: 17 mg/dL (ref 7–25)
CHLORIDE: 102 mmol/L (ref 98–110)
CO2: 26 mmol/L (ref 20–31)
Calcium: 10.6 mg/dL — ABNORMAL HIGH (ref 8.6–10.4)
Creat: 0.69 mg/dL (ref 0.50–0.99)
GFR, Est African American: >89 mL/min (ref >=60)
GFR, Est Non African American: >89 mL/min (ref >=60)
Glucose, Bld: 103 mg/dL — ABNORMAL HIGH (ref 65–99)
POTASSIUM: 4.4 mmol/L (ref 3.5–5.3)
SODIUM: 139 mmol/L (ref 135–146)

## 2015-12-01 LAB — PHOSPHORUS: PHOSPHORUS: 3.9 mg/dL (ref 2.3–4.6)

## 2015-12-01 LAB — MAGNESIUM: Magnesium: 2.1 mg/dL (ref 1.5–2.5)

## 2015-12-01 NOTE — Addendum Note (Signed)
Addended by: Kaylyn Lim I on: 12/01/2015 11:31 AM   Modules accepted: Orders

## 2015-12-02 LAB — PARATHYROID HORMONE, INTACT (NO CA): PTH: 42 pg/mL (ref 15–65)

## 2015-12-02 LAB — CALCIUM, URINE, 24 HOUR
CALCIUM UR: 7 mg/dL
Calcium, 24 hour urine: 231 mg/24 h (ref 35–250)

## 2015-12-02 LAB — CREATININE, URINE, 24 HOUR
Creatinine, 24H Ur: 1.25 g/(24.h) (ref 0.63–2.50)
Creatinine, Urine: 38 mg/dL (ref 20–320)

## 2015-12-04 LAB — VITAMIN D 1,25 DIHYDROXY
Vitamin D 1, 25 (OH)2 Total: 64 pg/mL (ref 18–72)
Vitamin D3 1, 25 (OH)2: 64 pg/mL

## 2015-12-05 LAB — PROTEIN ELECTROPHORESIS, SERUM, WITH REFLEX
ALBUMIN ELP: 4.1 g/dL (ref 3.8–4.8)
ALPHA-2-GLOBULIN: 0.6 g/dL (ref 0.5–0.9)
Alpha-1-Globulin: 0.3 g/dL (ref 0.2–0.3)
BETA 2: 0.3 g/dL (ref 0.2–0.5)
BETA GLOBULIN: 0.5 g/dL (ref 0.4–0.6)
Gamma Globulin: 0.5 g/dL — ABNORMAL LOW (ref 0.8–1.7)
Total Protein, Serum Electrophoresis: 6.2 g/dL (ref 6.1–8.1)

## 2015-12-05 LAB — IFE INTERPRETATION

## 2015-12-28 ENCOUNTER — Ambulatory Visit: Payer: PRIVATE HEALTH INSURANCE | Admitting: Internal Medicine

## 2016-01-02 ENCOUNTER — Other Ambulatory Visit: Payer: Self-pay

## 2016-01-02 MED ORDER — ACEBUTOLOL HCL 200 MG PO CAPS: 200.0000 mg | ORAL_CAPSULE | Freq: Two times a day (BID) | ORAL | 2 refills | Status: DC

## 2016-01-02 MED ORDER — ACEBUTOLOL HCL 200 MG PO CAPS: 200.0000 mg | ORAL_CAPSULE | Freq: Two times a day (BID) | ORAL | 0 refills | Status: DC

## 2016-01-29 ENCOUNTER — Ambulatory Visit (INDEPENDENT_AMBULATORY_CARE_PROVIDER_SITE_OTHER): Payer: Medicare Other | Admitting: Internal Medicine

## 2016-01-29 ENCOUNTER — Encounter: Payer: Self-pay | Admitting: Internal Medicine

## 2016-01-29 DIAGNOSIS — E559 Vitamin D deficiency, unspecified: Secondary | ICD-10-CM

## 2016-01-29 DIAGNOSIS — M8580 Other specified disorders of bone density and structure, unspecified site: Secondary | ICD-10-CM | POA: Diagnosis not present

## 2016-01-29 LAB — VITAMIN D 25 HYDROXY (VIT D DEFICIENCY, FRACTURES): VITD: 27.19 ng/mL — AB (ref 30.00–100.00)

## 2016-01-29 NOTE — Patient Instructions (Signed)
Please stop at the lab.  Please continue vitamin D 6000 units daily.  Please come back for a follow-up appointment in 6 months.

## 2016-01-29 NOTE — Progress Notes (Signed)
Patient ID: Alicia Schroeder, female   DOB: 1951-05-08, 65 y.o.   MRN: [id number]   HPI  Alicia Schroeder is a 65 y.o.-year-old female, initially referred by her PCP, Dr. Stephanie Acre, returning for follow-up for Hypercalcemia, inadequately suppressed PTH, vitamin D deficiency, and osteopenia (Op). last visit 4 months ago.  Pt was dx with Op in 06/2015.  I reviewed pt's DEXA scans: Date L1-L4 T score FN T score Ultra distal radius  33% distal Radius FRAX score   07/04/2015  +2.0  RFN: -1.0 L: -1.1 L: -0.9  MOF: 14%  Hip fracture risk: 0.5%   Has L THR.   She denies falls but had: - Left ankle trimalleolar fx - after falling through a rotten deck (11/2014) >> had surgery - clavicle fracture - after pushing a walker (12/15/2014) She also lost more than 4 cm in height from her maximum height.  No dizziness/vertigo/orthostasis/poor vision. She has some imbalance from her fx 'ed ankle >> fell recently >> did not have a fracture.  She has not been on any OP treatments in the past.  + h/o vitamin D insufficiency >> started 4000 units dailyThen increase to 6000 units at last visit, after vitamin D level returns still low.  28.15 on 11/22/2015  20.62 on 09/20/2015  30.3 on 06/05/2015 23.9 in 04/2014 She started [place] fish oil to improve the absorption of the vitamin D.  She switched to almond milk (1.5 cups a day). She eats dairy and green, leafy, vegetables.   No weight bearing exercises.   She does not take high vitamin A doses.  No h/o kidney stones.  She had several courses of steroid in the past, not recently. Last: Shingles - last 3 years.  Menopause was at 65 y/o.   Pt does have a FH of osteoporosis - hip fx in mother.  She has a history of slight hypercalcemia without hyperparathyroidism,but with an unsuppressed PTH: Lab Results  Component Value Date   PTH 42 12/01/2015   CALCIUM 10.6 (H) 12/01/2015   CALCIUM 10.8 (H) 12/26/2014   CALCIUM 10.1 10/01/2013   CALCIUM 9.9  09/30/2013   CALCIUM 10.7 (H) 09/22/2013  06/12/2015: Intact PTH 27 (15-65)-[facility]; calcium 10.5 (8.6-10.3)  At last visit, we checked several labs which pointed towards possible mild primary hyperparathyroidism: - Multiple myeloma workup was negative - Magnesium was normal - 1, 25 dihydroxy vitamin D was normal -  24-hour urinary calcium was 231 (35-250 mg per 24-hour), with adequate creatinine   No h/o thyrotoxicosis. Reviewed TSH recent levels:  06/05/2015: TSH 2.95 05/05/2014: TSH 2.33  No h/o CKD. Last BUN/Cr: Lab Results  Component Value Date   BUN 17 12/01/2015   CREATININE 0.69 12/01/2015   Last visit, due to the mild hyperparathyroidism, we decided to continue to just follow her and normalize the vitamin D.   ROS: Constitutional: + weight gain, + fatigue, + Hot flashes, + poor sleep Eyes: no blurry vision, no xerophthalmia ENT: no sore throat, no nodules palpated in throat, no dysphagia/odynophagia, no hoarseness Cardiovascular: no CP/SOB/palpitations/leg swelling Respiratory: no cough/SOB Gastrointestinal: no N/V/D/C Musculoskeletal: + Both: muscle/joint aches Skin: no rashes Neurological: no tremors/numbness/tingling/dizziness  I reviewed pt's medications, allergies, PMH, social hx, family hx, and changes were documented in the history of present illness. Otherwise, unchanged from my initial visit note.  Past Medical History:  Diagnosis Date  . Abnormal ECG   . Aortic valve sclerosis    no evidence per patient   . Arthritis  right greater the left hip  . Cancer ([place])    basal cell carcinoma removed- 24 years ago   . Depression    no meds  . Dysrhythmia    has soft Systolic Ejection Murmur  . Family history of anesthesia complication    sisterin law disabled from anesthesia   . Family history of premature coronary artery disease    Father with extensive coronary and carotid disease  . Hyperlipidemia   . Hypertension   . Pneumonia    15 years ago  approximately   . Premature ventricular contractions    Past Surgical History:  Procedure Laterality Date  . ANKLE FRACTURE SURGERY Left 12-09-14  . APPLICATION OF A-CELL OF EXTREMITY Left 03/27/2015   Procedure: APPLICATION OF A-CELL OF EXTREMITY;  Surgeon: Wallace Going, DO;  Location: [facility];  Service: Plastics;  Laterality: Left;  . I&D EXTREMITY Left 03/27/2015   Procedure: IRRIGATION AND DEBRIDEMENT LEFT ANKLE ;  Surgeon: Wallace Going, DO;  Location: [facility];  Service: Plastics;  Laterality: Left;  . JOINT REPLACEMENT Left   . NM LEXISCAN MYOVIEW LTD  08/03/2013   Normal study. No ischemia or infarction. Normal wall motion. EF 71%  . TOTAL HIP ARTHROPLASTY Left 09/29/2013   Procedure: LEFT TOTAL HIP ARTHROPLASTY ANTERIOR APPROACH;  Surgeon: Gearlean Alf, MD;  Location: [facility];  Service: Orthopedics;  Laterality: Left;  . TRANSTHORACIC ECHOCARDIOGRAM  08/05/2013   Normal LV size and function. EF 60-65%. No regional WMA; grade 1 diastolic dysfunction with mild LA dilation; no significant valvular lesions -- mild aortic valve calcification/sclerosis (could explain a soft systolic murmur)   Social History   Social History  . Marital Status: Divorced    Spouse Name: N/A  . Number of Children: 2 (7 miscarriages)    Social History Main Topics  . Smoking status: Never Smoker   . Smokeless tobacco: Never Used  . Alcohol Use: Yes     Comment: occasional   . Drug Use: No   Social History Narrative   She is a divorced mother of 2. She's been divorced for 10 years. She does long with her dog. 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    She's never smoked. Does not drink alcohol.      Phone [phone]   Children: Colie Josten [place], [place] - [phone]); Jaleesa Cervi [place], [place] [phone] since about.    Current Outpatient Prescriptions on File Prior to Visit  Medication Sig Dispense Refill  .  acebutolol (SECTRAL) 200 MG capsule Take 1 capsule (200 mg total) by mouth 2 (two) times daily. 120 capsule 2  . Ascorbic Acid (VITAMIN C) 1000 MG tablet Take 1,000 mg by mouth daily.    Marland Kitchen aspirin EC 325 MG tablet Take 1 tablet (325 mg total) by mouth daily. 42 tablet 0  . atorvastatin (LIPITOR) 20 MG tablet Take 20 mg by mouth daily at 6 PM. Patient takes in pm    . calcium-vitamin D 250-100 MG-UNIT per tablet Take 2 tablets by mouth every morning.     . cetirizine (ZYRTEC) 10 MG tablet Take 10 mg by mouth every evening.     . Cholecalciferol (VITAMIN D3) 2000 UNITS TABS Take 6,000 Units by mouth every morning.    Marland Kitchen lisinopril (PRINIVIL,ZESTRIL) 20 MG tablet Take 20 mg by mouth every evening.     . magnesium oxide (MAG-OX) 400 MG tablet Take 400 mg by mouth every morning.     Marland Kitchen  Misc Natural Products (OSTEO BI-FLEX TRIPLE STRENGTH PO) Take 1 tablet by mouth 2 (two) times daily.    . Multiple Vitamins-Minerals (MULTIVITAMIN ADULT PO) Take 1 tablet by mouth daily.    Marland Kitchen omega-3 acid ethyl esters (LOVAZA) 1 G capsule Take 1 g by mouth every morning.     Marland Kitchen PROTEIN PO Take 1 tablet by mouth daily.    Marland Kitchen Spirulina 500 MG TABS Take 500 mg by mouth every morning.     . TURMERIC PO Take 1 Dose by mouth every morning.      No current facility-administered medications on file prior to visit.    No Known Allergies Family History  Problem Relation Age of Onset  . Parkinson's disease Mother   . Thyroid disease Mother   . Hypertension Mother   . Heart failure Mother   . Hypertension Father   . Hyperlipidemia Father   . Heart disease Father 76    Triple bypass.   . Kidney failure Father   . Stroke Maternal Grandmother   . Transient ischemic attack Maternal Grandmother   . Arrhythmia Maternal Grandfather     Pacemaker  . Heart disease Paternal Grandmother   . Parkinson's disease Paternal Grandfather   . Hypertension Paternal Grandfather     PE: BP 134/88 (BP Location: Right Arm, Patient  Position: Sitting)   Pulse 81   Wt 245 lb (111.1 kg)   SpO2 96%   BMI 41.73 kg/m  Wt Readings from Last 3 Encounters:  01/29/16 245 lb (111.1 kg)  09/20/15 238 lb (108 kg)  03/27/15 226 lb (102.5 kg)   Constitutional: Obese, in NAD. No kyphosis. Eyes: PERRLA, EOMI, no exophthalmos ENT: moist mucous membranes, no thyromegaly, no cervical lymphadenopathy Cardiovascular: RRR, No MRG Respiratory: CTA B Gastrointestinal: abdomen soft, NT, ND, BS+ Musculoskeletal: no deformities, strength intact in all 4; no pain at spine percussion Skin: moist, warm, no rashes Neurological: + no tremors with outstretched hands, DTR normal in all 4  Assessment: 1. Osteoporosis  Plan: 1. Osteopenia - Possibly related to early menopause (at 65 years old). She also has family history of osteoporosis. - I reviewed her latest T-scores >> based on the T scores, she has a slightly increased risk for fractures. Her T-scores are in the high osteopenic range. However, since she already had 2 fractures, she can be considered at higher fracture risk based on clinical history. - At last visit, her vitamin D was low, so we increased her vitamin D supplement. We'll need to maintain her vitamin D level ideally at or higher than 40. - at last visit, We discussed about the different medication classes, however, I suggested to hold off medicines for now until we can clarify her hypercalcemia cause. If we need to start anti-resorptive medication, I would probably suggest once a week Fosamax initially.   2. Hypercalcemia - She had several calcium levels were slightly higher than the upper limit of normal. She had a slightly higher PTH level than expected. She also has a history of vitamin D insufficiency - lower than normal at last visit. - She was getting calcium from several supplements and I advised her to try to get no more than 600 mg of calcium a day. She is getting only this amount now. She occasionally takes a MVI. -  We will check a vitamin D level at this timem, along with a recheck on her PTH and Ca - No apparent complications from hypercalcemia: no h/o nephrolithiasis, no osteoporosis, but she  does have a history of fractures - We discussed possible consequences of hyperparathyroidism: ~1/3 pts will develop complications over 15 years (OP, nephrolithiasis). However, her calcium is minimally elevated at this point, so we decided to just continue to follow the labs for now. She does agree with a referral to surgery if calcium/PTH start to increase. Today we'll check:  Calcium  PTH  Vitamin D - I advised the patient to continue her current vitamin D supplementation for now (6000 units daily). I will advise her about vitamin D supplement dose when the results of the vitamin D level are back. - I will see the patient back in 6 months  Office Visit on 01/29/2016  Component Date Value Ref Range Status  . Calcium 01/30/2016 10.6* 8.7 - 10.3 mg/dL Final  . PTH 01/30/2016 48  15 - 65 pg/mL Final  . PTH 01/30/2016 Comment   Final   Comment: Interpretation                 Intact PTH    Calcium                                 (pg/mL)      (mg/dL) Normal                          15 - 65     8.6 - 10.2 Primary Hyperparathyroidism         >65          >10.2 Secondary Hyperparathyroidism       >65          <10.2 Non-Parathyroid Hypercalcemia       <65          >10.2 Hypoparathyroidism                  <15          < 8.6 Non-Parathyroid Hypocalcemia    15 - 65          < 8.6   . VITD 01/29/2016 27.19* 30.00 - 100.00 ng/mL Final   Mildly elevated calcium. PTH inappropriately normal. Vitamin D still low. Will increase vitamin D to 10,000 units daily. Will repeat the level in 2 months.  Philemon Kingdom, MD PhD [facility]

## 2016-01-30 LAB — PTH, INTACT AND CALCIUM
CALCIUM: 10.6 mg/dL — AB (ref 8.7–10.3)
PTH: 48 pg/mL (ref 15–65)

## 2016-02-16 DIAGNOSIS — Z23 Encounter for immunization: Secondary | ICD-10-CM | POA: Diagnosis not present

## 2016-03-11 ENCOUNTER — Ambulatory Visit (INDEPENDENT_AMBULATORY_CARE_PROVIDER_SITE_OTHER): Payer: Medicare Other | Admitting: Cardiology

## 2016-03-11 ENCOUNTER — Encounter: Payer: Self-pay | Admitting: Cardiology

## 2016-03-11 VITALS — BP 142/84 | HR 75 | Ht 63.0 in | Wt 244.0 lb

## 2016-03-11 DIAGNOSIS — E669 Obesity, unspecified: Secondary | ICD-10-CM | POA: Diagnosis not present

## 2016-03-11 DIAGNOSIS — I493 Ventricular premature depolarization: Secondary | ICD-10-CM | POA: Diagnosis not present

## 2016-03-11 DIAGNOSIS — I10 Essential (primary) hypertension: Secondary | ICD-10-CM

## 2016-03-11 DIAGNOSIS — E782 Mixed hyperlipidemia: Secondary | ICD-10-CM | POA: Diagnosis not present

## 2016-03-11 NOTE — Patient Instructions (Signed)
Medication Instructions:  NO CHANGES  Labwork: NONE  Follow-Up: Your physician wants you to follow-up in: WITH Dr. Ellyn Hack IN 12 MONTHS You will receive a reminder letter in the mail two months in advance. If you don't receive a letter, please call our office to schedule the follow-up appointment.   If you need a refill on your cardiac medications before your next appointment, please call your pharmacy.

## 2016-03-11 NOTE — Progress Notes (Signed)
PCP: Alicia Coma, MD  Clinic Note: Chief Complaint  Patient presents with  . Follow-up    HPI: Alicia Schroeder is a 65 y.o. female with a PMH below who presents today for annual f/u of HTN, HLD & strong FH of CAD.   I first met her when I was doing cardiovascular risk assessment for a upcoming surgery. She has a a significant family history of CAD -- (father w/ initial onset of disease was in his 74s, he had carotid endarterectomy as well as 3 vessel CABG plus valve replacement. He eventually developed ischemic cardiomyopathy). He has a history of PVCs.  Her EKG performed during a clinic visit earlier 2015 demonstrated PVCs and trigeminy pattern. Was on Acebutalol for palpitations.  [name] was last seen on October 2016 - major issues related to broken bones.  Aug 8th - Pathologic Fxr of Left Clavicle after L Ankle fxr - complicated by poorly healing wound on L ankle due to poor bone union   Recent Hospitalizations: n/a Studies Reviewed: none  Interval History: Alicia Schroeder presents today doing quite well. She actually is comes in with a smile stating that she feels that she's come a long way from last year and is overall doing quite well. Compared to last year's fractures and other issues, she feels overall happy. She is back trying to do walking and exercise, but is limited by her hip pain on the left side -- she was diagnosed with osteopenia and calcium plus vitamin D deficiency. She is having a hard time keeping up with how much she has to take.. She is concerned this may need to be dealt with in the future. For the most part, she states that her palpitations are almost completely control with acebutolol.  She has been stable overall from a cardiac standpoint. Cardiac review of symptoms as follows: No chest pain or shortness of breath with rest or exertion.  No PND, orthopnea or edema.  No lightheadedness, dizziness, weakness or syncope/near syncope. No TIA/amaurosis fugax  symptoms. No claudication.  ROS: A comprehensive was performed. Review of Systems  Constitutional: Negative for malaise/fatigue.  HENT: Negative for nosebleeds.   Respiratory: Negative for cough and wheezing.   Cardiovascular: Negative for claudication.  Gastrointestinal: Negative for blood in stool and melena.  Genitourinary: Negative for hematuria.  Musculoskeletal: Positive for joint pain (Left hip pain now).  Skin: Negative.   Neurological: Negative for dizziness and loss of consciousness.  Endo/Heme/Allergies: Does not bruise/bleed easily.  Psychiatric/Behavioral: Negative for depression and memory loss. The patient is not nervous/anxious and does not have insomnia.   All other systems reviewed and are negative.   Past Medical History:  Diagnosis Date  . Abnormal ECG   . Aortic valve sclerosis    no evidence per patient   . Arthritis     right greater the left hip  . Cancer ([place])    basal cell carcinoma removed- 24 years ago   . Depression    no meds  . Dysrhythmia    has soft Systolic Ejection Murmur  . Family history of anesthesia complication    sisterin law disabled from anesthesia   . Family history of premature coronary artery disease    Father with extensive coronary and carotid disease  . Hyperlipidemia   . Hypertension   . Pneumonia    15 years ago approximately   . Premature ventricular contractions     Past Surgical History:  Procedure Laterality Date  . ANKLE FRACTURE SURGERY Left  12-09-14  . APPLICATION OF A-CELL OF EXTREMITY Left 03/27/2015   Procedure: APPLICATION OF A-CELL OF EXTREMITY;  Surgeon: Wallace Going, DO;  Location: [facility];  Service: Plastics;  Laterality: Left;  . I&D EXTREMITY Left 03/27/2015   Procedure: IRRIGATION AND DEBRIDEMENT LEFT ANKLE ;  Surgeon: Wallace Going, DO;  Location: [facility];  Service: Plastics;  Laterality: Left;  . JOINT REPLACEMENT Left   . NM LEXISCAN MYOVIEW LTD   08/03/2013   Normal study. No ischemia or infarction. Normal wall motion. EF 71%  . TOTAL HIP ARTHROPLASTY Left 09/29/2013   Procedure: LEFT TOTAL HIP ARTHROPLASTY ANTERIOR APPROACH;  Surgeon: Gearlean Alf, MD;  Location: [facility];  Service: Orthopedics;  Laterality: Left;  . TRANSTHORACIC ECHOCARDIOGRAM  08/05/2013   Normal LV size and function. EF 60-65%. No regional WMA; grade 1 diastolic dysfunction with mild LA dilation; no significant valvular lesions -- mild aortic valve calcification/sclerosis (could explain a soft systolic murmur)    Prior to Admission medications   Medication Sig Start Date End Date Taking? Authorizing Provider  acebutolol (SECTRAL) 200 MG capsule TAKE 1 CAPSULE BY MOUTH 2 TIMES DAILY. 12/05/14  Yes Leonie Man, MD  Ascorbic Acid (VITAMIN C) 1000 MG tablet Take 1,000 mg by mouth daily.   Yes Historical Provider, MD  aspirin EC 325 MG tablet Take 1 tablet (325 mg total) by mouth daily. 12/28/14  Yes Marya Amsler Ollis, PA-C  atorvastatin (LIPITOR) 20 MG tablet Take 20 mg by mouth daily at 6 PM. Patient takes in pm 04/20/13  Yes Historical Provider, MD  calcium-vitamin D 250-100 MG-UNIT per tablet Take 2 tablets by mouth every morning.    Yes Historical Provider, MD  celecoxib (CELEBREX) 200 MG capsule Take 1 capsule (200 mg total) by mouth daily. 12/28/14  Yes Marya Amsler Ollis, PA-C  cetirizine (ZYRTEC) 10 MG tablet Take 10 mg by mouth every evening.    Yes Historical Provider, MD  Cholecalciferol (VITAMIN D3) 2000 UNITS TABS Take 2,000 Units by mouth every morning.    Yes Historical Provider, MD  lisinopril (PRINIVIL,ZESTRIL) 20 MG tablet Take 20 mg by mouth every evening.  04/20/13  Yes Historical Provider, MD  magnesium oxide (MAG-OX) 400 MG tablet Take 400 mg by mouth every morning.    Yes Historical Provider, MD  Multiple Vitamins-Minerals (MULTIVITAMIN ADULT PO) Take 1 tablet by mouth daily.   Yes Historical Provider, MD  omega-3 acid ethyl esters (LOVAZA) 1 G  capsule Take 1 g by mouth every morning.    Yes Historical Provider, MD  PROTEIN PO Take 1 tablet by mouth daily.   Yes Historical Provider, MD  Spirulina 500 MG TABS Take 500 mg by mouth every morning.    Yes Historical Provider, MD  TURMERIC PO Take 1 Dose by mouth every morning.    Yes Historical Provider, MD  Zinc 15 MG CAPS Take 1 capsule by mouth.   Yes Historical Provider, MD    No Known Allergies   Social History   Social History  . Marital status: Divorced    Spouse name: N/A  . Number of children: N/A  . Years of education: N/A   Social History Main Topics  . Smoking status: Never Smoker  . Smokeless tobacco: Never Used  . Alcohol use Yes     Comment: occasional   . Drug use: No  . Sexual activity: Not Asked   Other Topics Concern  . None   Social History  Narrative   She is a divorced mother of 2. She's been divorced for 10 years. She does long with her dog. 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    She's never smoked. Does not drink alcohol.    she does daily exercises for her hips 7 nasal week for 20 minutes at a time. This is mostly stretching and. Because of her hip pain she is no longer able to walk like she used to.    Phone [phone]   Children: Greg Eckrich [place], [place] - [phone]); Krisna Omar [place], [place] [phone] since about.    Family History family history includes Arrhythmia in her maternal grandfather; Heart disease in her paternal grandmother; Heart disease (age of onset: 52) in her father; Heart failure in her mother; Hyperlipidemia in her father; Hypertension in her father, mother, and paternal grandfather; Kidney failure in her father; Parkinson's disease in her mother and paternal grandfather; Stroke in her maternal grandmother; Thyroid disease in her mother; Transient ischemic attack in her maternal grandmother.   Wt Readings from Last 3 Encounters:  03/11/16 110.7 kg (244 lb)  01/29/16 111.1 kg  (245 lb)  09/20/15 108 kg (238 lb)  Had lost ~20 lb before ankle fxr - gained most of it back while in rehab   PHYSICAL EXAM BP (!) 142/84   Pulse 75   Ht _0  (1.6 m)   Wt 110.7 kg (244 lb)   BMI 43.22 kg/m   @ home 120/76 mmHg General appearance: alert, cooperative, appears stated age, no distress, moderately obese and Well-nourished and well-groomed. Pleasant mood and affect. Answers questions appropriately HEENT: Boomer/AT, EOMI, MMM, anicteric sclera Neck: Supple, no adenopathy, carotid bruit, or JVD, Lungs:CTAB, normal percussion bilaterally and Nonlabored, and good air movement Heart: RRR with frequent ectopy. Normal S1-S2. Soft 1/6 SEM @ RUSB; no other R/G. Nondisplaced PMI. Abdomen: soft,NT/ND/NABS; no masses, no organomegaly and mild truncal obesity Extremities: No C/C/E; no rash or lesions.; Pulses: 2+ and symmetric Neurologic: Alert and oriented X 3, normal strength and tone.     Adult ECG Report  Rate: 75;  Rhythm: normal sinus rhythm and IRBB; normal axis, intervals and durations.  Narrative Interpretation: otherwise stable/normal EKG   Other studies Reviewed: Additional studies/ records that were reviewed today include:  Recent Labs:  Due for lab check soon   ASSESSMENT / PLAN: Problem List Items Addressed This Visit    PVC's (premature ventricular contractions) - Primary (Chronic)    Essentially resolved after reducing caffeine and starting stable dose of acebutolol.      Relevant Medications   lisinopril (PRINIVIL,ZESTRIL) 20 MG tablet   Other Relevant Orders   EKG 12-Lead (Completed)   Obesity (BMI 30-39.9) (Chronic)    She actually gained weight since last visit. Had previously lost a lot of weight before she hurt her ankle. Now 7 or Keeping weight off. Continue to encourage dietary modifications and exercise to the best of her ability.      Mixed hyperlipidemia (Chronic)    Unfortunately we know have any recent labs. She is due to have labs checked  soon by her PCP. She is on stable dose of atorvastatin and omega-3 fatty acids. No significant myalgias      Relevant Medications   lisinopril (PRINIVIL,ZESTRIL) 20 MG tablet   Other Relevant Orders   EKG 12-Lead (Completed)   Essential hypertension (Chronic)    Borderline control today on lisinopril and acebutolol.  Continue current doses for now, monitor  for increased pressures.      Relevant Medications   lisinopril (PRINIVIL,ZESTRIL) 20 MG tablet   Other Relevant Orders   EKG 12-Lead (Completed)    Other Visit Diagnoses   None.     Current medicines are reviewed at length with the patient today. (+/- concerns) none The following changes have been made: none  Patient Instructions  Medication Instructions:  NO CHANGES  Labwork: NONE  Follow-Up: Your physician wants you to follow-up in: WITH Dr. Ellyn Hack IN 12 MONTHS You will receive a reminder letter in the mail two months in advance. If you don't receive a letter, please call our office to schedule the follow-up appointment.   If you need a refill on your cardiac medications before your next appointment, please call your pharmacy.    Studies Ordered:   Orders Placed This Encounter  Procedures  . EKG 12-Lead      Glenetta Hew, M.D., M.S. Interventional Cardiologist   Pager # [phone]

## 2016-03-12 ENCOUNTER — Encounter: Payer: Self-pay | Admitting: Cardiology

## 2016-03-12 NOTE — Assessment & Plan Note (Signed)
Unfortunately we know have any recent labs. She is due to have labs checked soon by her PCP. She is on stable dose of atorvastatin and omega-3 fatty acids. No significant myalgias

## 2016-03-12 NOTE — Assessment & Plan Note (Signed)
She actually gained weight since last visit. Had previously lost a lot of weight before she hurt her ankle. Now 7 or Keeping weight off. Continue to encourage dietary modifications and exercise to the best of her ability.

## 2016-03-12 NOTE — Assessment & Plan Note (Signed)
Essentially resolved after reducing caffeine and starting stable dose of acebutolol.

## 2016-03-12 NOTE — Assessment & Plan Note (Signed)
Borderline control today on lisinopril and acebutolol.  Continue current doses for now, monitor for increased pressures.

## 2016-04-02 ENCOUNTER — Other Ambulatory Visit (INDEPENDENT_AMBULATORY_CARE_PROVIDER_SITE_OTHER): Payer: Medicare Other

## 2016-04-02 DIAGNOSIS — E559 Vitamin D deficiency, unspecified: Secondary | ICD-10-CM | POA: Diagnosis not present

## 2016-04-02 LAB — VITAMIN D 25 HYDROXY (VIT D DEFICIENCY, FRACTURES): VITD: 27.95 ng/mL — ABNORMAL LOW (ref 30.00–100.00)

## 2016-04-03 ENCOUNTER — Telehealth: Payer: Self-pay | Admitting: Internal Medicine

## 2016-04-03 NOTE — Telephone Encounter (Signed)
Patient called [facility] want to know if she is taking vitamin D regularly. And no calcium level or pth level done that are normally tested for.. Please advise

## 2016-04-04 NOTE — Telephone Encounter (Signed)
Patient is calling on the status of her Vitamin D results.  Please advise today if possible

## 2016-04-04 NOTE — Telephone Encounter (Signed)
There is no need to check a PTH level so close to the previous one.  Regarding the vitamin D, I will suggest to add an extra 10,000 unit dose on Sundays. I will recheck the level at next visit.

## 2016-04-04 NOTE — Telephone Encounter (Signed)
I contacted the patient and advised of message. Patient did state she took the vitamin D consistently and did not miss any dosages. Patient was not happy about her PTH not being checked at her last lab visit. She stated she reviewed her blood tests and the PTH is consistently going up over the recent blood tests and wanted to know if you could advise why you think her blood test's could be coming up? Thanks!

## 2016-04-04 NOTE — Telephone Encounter (Signed)
I am not sure I understand the question... The last message I sent her through my chart regarding her vitamin D level from 03/2016 was:  Dear Ms Caroline More,  Your vitamin D level is still slightly low. Were you taking the 10,000 units daily consistently? Or have you missed few doses?  Please let me know,  Sincerely,  Philemon Kingdom MD   I did not repeat the calcium and PTH level at that time, since we had one in September.

## 2016-04-05 NOTE — Telephone Encounter (Signed)
I contacted the patient and advised of the message. Patient verbalized understanding and had no further questions at this time.

## 2016-06-17 DIAGNOSIS — Z85828 Personal history of other malignant neoplasm of skin: Secondary | ICD-10-CM | POA: Diagnosis not present

## 2016-06-17 DIAGNOSIS — L821 Other seborrheic keratosis: Secondary | ICD-10-CM | POA: Diagnosis not present

## 2016-06-17 DIAGNOSIS — L570 Actinic keratosis: Secondary | ICD-10-CM | POA: Diagnosis not present

## 2016-06-17 DIAGNOSIS — L814 Other melanin hyperpigmentation: Secondary | ICD-10-CM | POA: Diagnosis not present

## 2016-06-17 DIAGNOSIS — D225 Melanocytic nevi of trunk: Secondary | ICD-10-CM | POA: Diagnosis not present

## 2016-06-17 DIAGNOSIS — D485 Neoplasm of uncertain behavior of skin: Secondary | ICD-10-CM | POA: Diagnosis not present

## 2016-06-20 ENCOUNTER — Other Ambulatory Visit (HOSPITAL_COMMUNITY)
Admission: RE | Admit: 2016-06-20 | Discharge: 2016-06-20 | Disposition: A | Discharge status: Home / Self Care | Payer: Medicare HMO | Source: Ambulatory Visit | Attending: Family Medicine | Admitting: Family Medicine

## 2016-06-20 ENCOUNTER — Other Ambulatory Visit: Payer: Self-pay | Admitting: Family Medicine

## 2016-06-20 DIAGNOSIS — I10 Essential (primary) hypertension: Secondary | ICD-10-CM | POA: Diagnosis not present

## 2016-06-20 DIAGNOSIS — E559 Vitamin D deficiency, unspecified: Secondary | ICD-10-CM | POA: Diagnosis not present

## 2016-06-20 DIAGNOSIS — E210 Primary hyperparathyroidism: Secondary | ICD-10-CM | POA: Diagnosis not present

## 2016-06-20 DIAGNOSIS — R7303 Prediabetes: Secondary | ICD-10-CM | POA: Diagnosis not present

## 2016-06-20 DIAGNOSIS — R509 Fever, unspecified: Secondary | ICD-10-CM | POA: Diagnosis not present

## 2016-06-20 DIAGNOSIS — Z01411 Encounter for gynecological examination (general) (routine) with abnormal findings: Secondary | ICD-10-CM | POA: Diagnosis not present

## 2016-06-20 DIAGNOSIS — Z23 Encounter for immunization: Secondary | ICD-10-CM | POA: Diagnosis not present

## 2016-06-20 DIAGNOSIS — Z6841 Body Mass Index (BMI) 40.0 and over, adult: Secondary | ICD-10-CM | POA: Diagnosis not present

## 2016-06-20 DIAGNOSIS — Z79899 Other long term (current) drug therapy: Secondary | ICD-10-CM | POA: Diagnosis not present

## 2016-06-20 DIAGNOSIS — Z0000 Encounter for general adult medical examination without abnormal findings: Secondary | ICD-10-CM | POA: Diagnosis not present

## 2016-06-20 DIAGNOSIS — E7800 Pure hypercholesterolemia, unspecified: Secondary | ICD-10-CM | POA: Diagnosis not present

## 2016-06-25 DIAGNOSIS — Z1211 Encounter for screening for malignant neoplasm of colon: Secondary | ICD-10-CM | POA: Diagnosis not present

## 2016-06-25 LAB — CYTOLOGY - PAP: Diagnosis: NEGATIVE

## 2016-06-28 ENCOUNTER — Encounter: Payer: Self-pay | Admitting: Internal Medicine

## 2016-06-28 NOTE — Progress Notes (Signed)
Received labs from PCP from 06/20/2016:  HbA1c 6.2%  CBC normal  CMP normal, except glucose 116. Calcium was high at 10.8, but corrected 10.32 (8.6-10.3), BUN/creatinine 17/0.68, EGFR 87.  TSH 2.32  Lipids: 161/82/78/66 She is due for another vitamin D level check. I am hoping that this would be normal so we can go ahead with a referral to surgery. I will see the patient next month.

## 2016-07-29 ENCOUNTER — Ambulatory Visit: Payer: PRIVATE HEALTH INSURANCE | Admitting: Internal Medicine

## 2016-07-31 ENCOUNTER — Encounter: Payer: Self-pay | Admitting: Internal Medicine

## 2016-07-31 ENCOUNTER — Ambulatory Visit (INDEPENDENT_AMBULATORY_CARE_PROVIDER_SITE_OTHER): Payer: Medicare HMO | Admitting: Internal Medicine

## 2016-07-31 VITALS — BP 142/90 | HR 80 | Wt 240.0 lb

## 2016-07-31 DIAGNOSIS — E559 Vitamin D deficiency, unspecified: Secondary | ICD-10-CM

## 2016-07-31 DIAGNOSIS — E213 Hyperparathyroidism, unspecified: Secondary | ICD-10-CM | POA: Diagnosis not present

## 2016-07-31 DIAGNOSIS — M8580 Other specified disorders of bone density and structure, unspecified site: Secondary | ICD-10-CM | POA: Diagnosis not present

## 2016-07-31 LAB — VITAMIN D 25 HYDROXY (VIT D DEFICIENCY, FRACTURES): VITD: 39.96 ng/mL (ref 30.00–100.00)

## 2016-07-31 NOTE — Progress Notes (Signed)
Patient ID: Alicia Schroeder, female   DOB: [birth year], 66 y.o.   MRN: [id number]   HPI  Alicia Schroeder is a 66 y.o.-year-old female, initially referred by her PCP, Dr. Stephanie Acre, returning for follow-up for Hypercalcemia, inadequately suppressed PTH, vitamin D deficiency, and osteopenia (Op). last visit 6 months ago.  She will have R THR soon. She has R hip pain. Started CoQ10.  I recently received labs from PCP from 06/20/2016:  HbA1c 6.2%  CBC normal  CMP normal, except glucose 116. Calcium was high at 10.8, but corrected 10.32 (8.6-10.3), BUN/creatinine 17/0.68, eGFR 87.  TSH 2.32  Lipids: 161/82/78/66  Pt was dx with Osteopenia in 06/2015.  I reviewed pt's DEXA scans: Date L1-L4 T score FN T score Ultra distal radius  33% distal Radius FRAX score   07/04/2015  +2.0  RFN: -1.0 L: -1.1 L: -0.9  MOF: 14%  Hip fracture risk: 0.5%   Has L THR.   She denies falls but had: - Left ankle trimalleolar fx - after falling through a rotten deck (11/2014) >> had surgery - clavicle fracture - after pushing a walker (12/15/2014) She also lost more than 4 cm in height from her maximum height.  No dizziness/vertigo/orthostasis/poor vision. She has some imbalance from her fx 'ed ankle >> fell recently >> did not have a fracture.  She has not been on any OP treatments in the past.  No weight bearing exercises.   She does not take high vitamin A doses.  No h/o kidney stones.  She had several courses of steroid in the past, not recently.   Menopause was at 66 y/o.   Pt does have a FH of osteoporosis - hip fx in mother.  She also has vitamin D insufficiency >> started 4000 units daily >> 6000 units >> 10,000 units >> now Increased to 10,000 units 6/7 days and 20,000 units 1/7 days, since last vitamin D level was still slightly low: Lab Results  Component Value Date   VD25OH 27.95 (L) 04/02/2016   VD25OH 27.19 (L) 01/29/2016   VD25OH 28.15 (L) 11/22/2015   VD25OH 20.62 (L) 09/20/2015    30.3 on 06/05/2015 23.9 in 04/2014 She started [place] fish oil to improve the absorption of the vitamin D.  She still drinks almond milk (1.5 cups a day >> ~650 mg Ca). She eats dairy and green, leafy, vegetables.   She has a history of slight hypercalcemia without hyperparathyroidism,but with an unsuppressed PTH: Lab Results  Component Value Date   PTH 48 01/29/2016   PTH Comment 01/29/2016   PTH 42 12/01/2015   CALCIUM 10.6 (H) 01/29/2016   CALCIUM 10.6 (H) 12/01/2015   CALCIUM 10.8 (H) 12/26/2014   CALCIUM 10.1 10/01/2013   CALCIUM 9.9 09/30/2013   CALCIUM 10.7 (H) 09/22/2013  06/12/2015: Intact PTH 27 (15-65)-[facility]; calcium 10.5 (8.6-10.3)   We checked several labs which pointed towards possible mild primary hyperparathyroidism: - Multiple myeloma workup was negative - Magnesium was normal - 1, 25 dihydroxy vitamin D was normal -  24-hour urinary calcium was 231 (35-250 mg per 24-hour), with adequate creatinine   No h/o thyrotoxicosis. Reviewed TSH recent levels:  06/05/2015: TSH 2.95 05/05/2014: TSH 2.33  No h/o CKD. Last BUN/Cr: Lab Results  Component Value Date   BUN 17 12/01/2015   CREATININE 0.69 12/01/2015   At the previous visits, due to the mild hyperparathyroidism, we decided to continue to just follow her and normalize the vitamin D.    ROS: Constitutional: no weight gain, +  fatigue, + Hot flashes, + poor sleep Eyes: no blurry vision, no xerophthalmia ENT: no sore throat, no nodules palpated in throat, no dysphagia/odynophagia, no hoarseness Cardiovascular: no CP/SOB/palpitations/leg swelling Respiratory: no cough/SOB Gastrointestinal: no N/V/D/C Musculoskeletal: + Both: muscle/joint aches - R hip Skin: no rashes Neurological: no tremors/numbness/tingling/dizziness  I reviewed pt's medications, allergies, PMH, social hx, family hx, and changes were documented in the history of present illness. Otherwise, unchanged from my initial visit  note.  Past Medical History:  Diagnosis Date  . Abnormal ECG   . Aortic valve sclerosis    no evidence per patient   . Arthritis     right greater the left hip  . Cancer ([place])    basal cell carcinoma removed- 24 years ago   . Depression    no meds  . Dysrhythmia    has soft Systolic Ejection Murmur  . Family history of anesthesia complication    sisterin law disabled from anesthesia   . Family history of premature coronary artery disease    Father with extensive coronary and carotid disease  . Hyperlipidemia   . Hypertension   . Pneumonia    15 years ago approximately   . Premature ventricular contractions    Past Surgical History:  Procedure Laterality Date  . ANKLE FRACTURE SURGERY Left 12-09-14  . APPLICATION OF A-CELL OF EXTREMITY Left 03/27/2015   Procedure: APPLICATION OF A-CELL OF EXTREMITY;  Surgeon: Wallace Going, DO;  Location: [facility];  Service: Plastics;  Laterality: Left;  . I&D EXTREMITY Left 03/27/2015   Procedure: IRRIGATION AND DEBRIDEMENT LEFT ANKLE ;  Surgeon: Wallace Going, DO;  Location: [facility];  Service: Plastics;  Laterality: Left;  . JOINT REPLACEMENT Left   . NM LEXISCAN MYOVIEW LTD  08/03/2013   Normal study. No ischemia or infarction. Normal wall motion. EF 71%  . TOTAL HIP ARTHROPLASTY Left 09/29/2013   Procedure: LEFT TOTAL HIP ARTHROPLASTY ANTERIOR APPROACH;  Surgeon: Gearlean Alf, MD;  Location: [facility];  Service: Orthopedics;  Laterality: Left;  . TRANSTHORACIC ECHOCARDIOGRAM  08/05/2013   Normal LV size and function. EF 60-65%. No regional WMA; grade 1 diastolic dysfunction with mild LA dilation; no significant valvular lesions -- mild aortic valve calcification/sclerosis (could explain a soft systolic murmur)   Social History   Social History  . Marital Status: Divorced    Spouse Name: N/A  . Number of Children: 2 (7 miscarriages)    Social History Main Topics  . Smoking status: Never  Smoker   . Smokeless tobacco: Never Used  . Alcohol Use: Yes     Comment: occasional   . Drug Use: No   Social History Narrative   She is a divorced mother of 2. She's been divorced for 10 years. She does long with her dog. 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    She's never smoked. Does not drink alcohol.      Phone [phone]   Children: Alicia Schroeder, [place] - [phone]); Alicia [place], [place] [phone] since about.    Current Outpatient Prescriptions on File Prior to Visit  Medication Sig Dispense Refill  . acebutolol (SECTRAL) 200 MG capsule Take 1 capsule (200 mg total) by mouth 2 (two) times daily. 120 capsule 2  . aspirin EC 325 MG tablet Take 1 tablet (325 mg total) by mouth daily. 42 tablet 0  . atorvastatin (LIPITOR) 20 MG tablet Take 20 mg by mouth daily  at 6 PM. Patient takes in pm    . celecoxib (CELEBREX) 200 MG capsule Take 200 mg by mouth daily.    . cetirizine (ZYRTEC) 10 MG tablet Take 10 mg by mouth every evening.     . Cholecalciferol (VITAMIN D3) 2000 UNITS TABS Take 10,000 Units by mouth every morning.    Marland Kitchen lisinopril (PRINIVIL,ZESTRIL) 20 MG tablet Take 20 mg by mouth every evening.     Marland Kitchen lisinopril (PRINIVIL,ZESTRIL) 20 MG tablet Take 1 tablet by mouth daily.    . magnesium oxide (MAG-OX) 400 MG tablet Take 400 mg by mouth every morning.     . Misc Natural Products (OSTEO BI-FLEX TRIPLE STRENGTH PO) Take 1 tablet by mouth 2 (two) times daily.    Marland Kitchen omega-3 acid ethyl esters (LOVAZA) 1 G capsule Take 1 g by mouth every morning.     . Turmeric 500 MG CAPS Take 1 tablet by mouth 2 (two) times daily.    . TURMERIC PO Take 1 Dose by mouth every morning.     . Zinc 50 MG TABS Take 1 tablet by mouth daily.     No current facility-administered medications on file prior to visit.    No Known Allergies Family History  Problem Relation Age of Onset  . Parkinson's disease Mother   . Thyroid disease Mother   .  Hypertension Mother   . Heart failure Mother   . Hypertension Father   . Hyperlipidemia Father   . Heart disease Father 84    Triple bypass.   . Kidney failure Father   . Stroke Maternal Grandmother   . Transient ischemic attack Maternal Grandmother   . Arrhythmia Maternal Grandfather     Pacemaker  . Heart disease Paternal Grandmother   . Parkinson's disease Paternal Grandfather   . Hypertension Paternal Grandfather     PE: BP (!) 142/90 (BP Location: Left Arm, Patient Position: Sitting)   Pulse 80   Wt 240 lb (108.9 kg)   SpO2 98%   BMI 42.51 kg/m  Wt Readings from Last 3 Encounters:  07/31/16 240 lb (108.9 kg)  03/11/16 244 lb (110.7 kg)  01/29/16 245 lb (111.1 kg)   Constitutional: Obese, in NAD. No kyphosis. Eyes: PERRLA, EOMI, no exophthalmos ENT: moist mucous membranes, no thyromegaly, no cervical lymphadenopathy Cardiovascular: RRR, No MRG Respiratory: CTA B Gastrointestinal: abdomen soft, NT, ND, BS+ Musculoskeletal: no deformities, strength intact in all 4; no pain at spine percussion Skin: moist, warm, no rashes Neurological: + mild tremors with outstretched hands, DTR normal in all 4  Assessment: 1. Osteopenia  2. Mild Primary Hyperparathyroidism  3. Vit D insuff.  Plan: 1. Osteopenia - Possibly related to early menopause (at 66 years old). She also has family history of osteoporosis. - I reviewed her latest T-scores >> based on the T scores, she has a slightly increased risk for fractures. Her T-scores are in the borderline osteopenic range. However, since she already had 2 fractures, she can be considered at higher fracture risk based on clinical history. - We continue to have problems repleting her vitamin D, and she is now on high-dose of this supplement; will recheck her vitamin D today - Today we also discussed her calcium intake, which appears adequate. She continues to get approximately 650 mg calcium a day only from almond milk, and she is also  eating green leafy vegetables, so I believe that she is getting approximately 1000 mg calcium per day. - at a previous visit, we discussed  about the different medication classes, however, we held off medicines for now until we can clarify her hypercalcemia cause. If we need to start anti-resorptive medication, would probably suggest once a week Fosamax initially.   2. Mild Primary Hyperparathyroidism and 3. Vit D insuff. - She had several calcium levels were slightly higher than the upper limit of normal + a PTH level normal, but higher than expected. She also has a history of vitamin D insufficiency, which may elevated the PTH, however, latest vitamin D levels were very close to the LLN, most likely now low enough to cause a substantial increase in PTH - We will check a vitamin D level now, along with a recheck on her PTH and Ca - No apparent complications from hypercalcemia: no h/o nephrolithiasis, no osteoporosis, but she does have a history of fractures - We again discussed possible consequences of hyperparathyroidism: ~1/3 pts will develop complications over 15 years (OP, nephrolithiasis). Her calcium is minimally elevated, so we decided to just continue to follow the labs, but she is interested in treatment. Would ask a second opinion from Dr. Harlow Asa. - I advised the patient to continue her current vitamin D supplementation for now (10,000 units daily except + an extra 10,000 1/7 days). I will advise her about vitamin D supplement dose when the results of the vitamin D level are back. - I will see the patient back in 6 months  Component     Latest Ref Rng & Units 07/31/2016 07/31/2016        10:36 AM 10:36 AM  Calcium     8.7 - 10.3 mg/dL  10.4 (H)  PTH     15 - 65 pg/mL  30  VITD     30.00 - 100.00 ng/mL 39.96    Calcium is still slightly elevated, with an improved PTH, but still not completely suppressed. Vitamin D is now normal. We'll continue the current dose of vitamin D  supplementation  Philemon Kingdom, MD PhD [facility] Endocrin[facility]

## 2016-07-31 NOTE — Patient Instructions (Signed)
Please stop at the lab.  Please continue vitamin D 10,000 units 6/7 days, and 20,000 units 1/7 days.  Please come back for a follow-up appointment in 6 months.

## 2016-08-01 LAB — PTH, INTACT AND CALCIUM
CALCIUM: 10.4 mg/dL — AB (ref 8.7–10.3)
PTH: 30 pg/mL (ref 15–65)

## 2016-08-03 IMAGING — CT CT CHEST W/ CM
2 of 3 series · 15 of 36 positions shown, 18 images · IV contrast (Omni 300)
Comparison: Chest x-ray 09/22/2013. Recent x-rays the left clavicle
not available for review

CLINICAL DATA: Pathologic fracture left clavicle

EXAM:
CT CHEST WITH CONTRAST
TECHNIQUE: Multidetector CT imaging of the chest was performed during
intravenous contrast administration.
CONTRAST:  75mL OMNIPAQUE IOHEXOL 300 MG/ML  SOLN

[Series 2: thorax 5.0 i31f 1 · axial · 0.73mm/px · z∈[-275,+10]mm · 12 of 67 slices shown, 15 images]
[im 5/67  mediastinal]
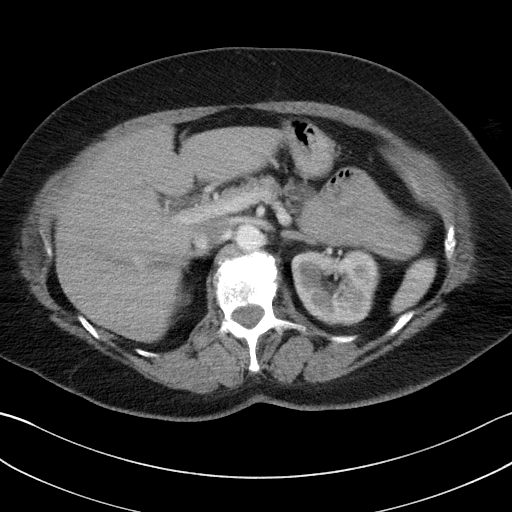
[im 5/67  lung]
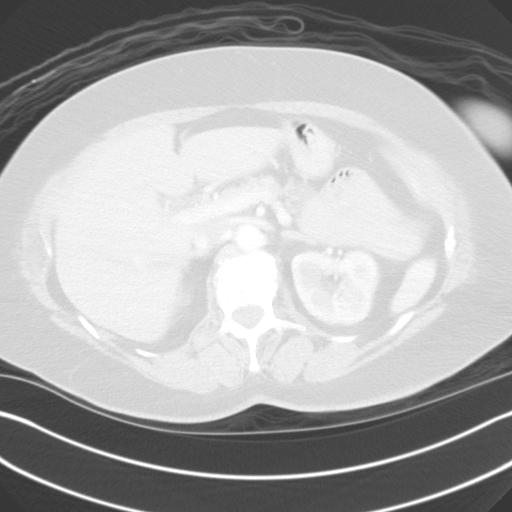
[im 10/67  lung]
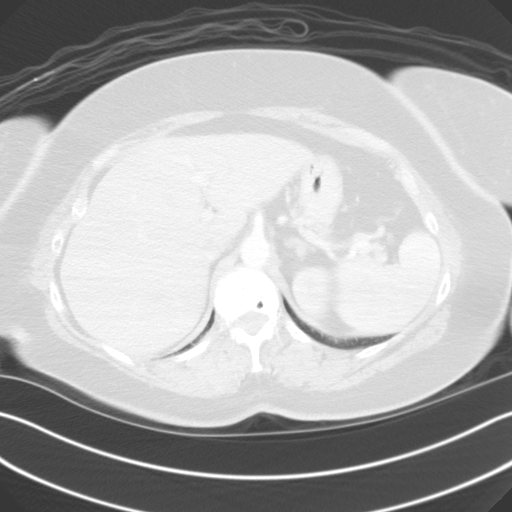
[im 15/67  lung]
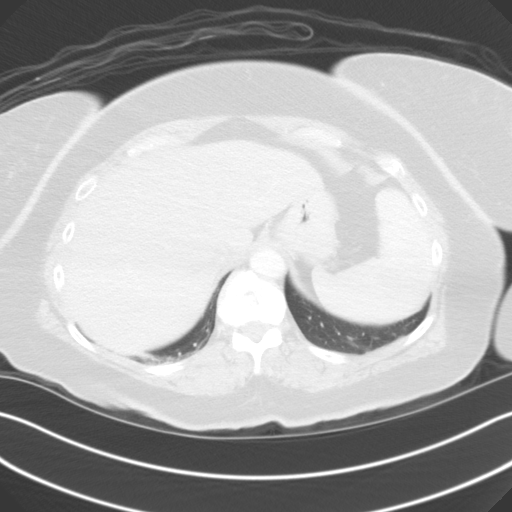
[im 20/67  lung]
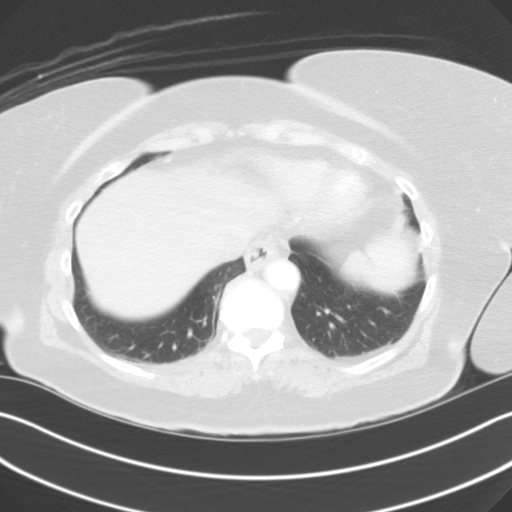
[im 25/67  mediastinal]
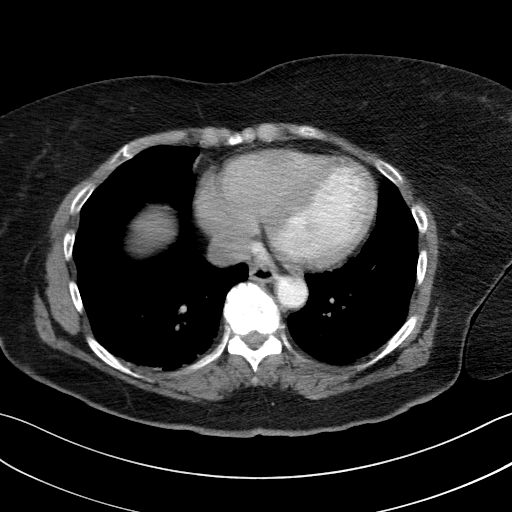
[im 25/67  lung]
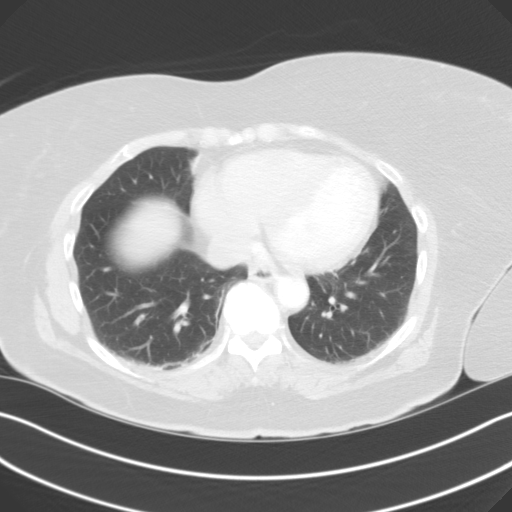
[im 30/67  lung]
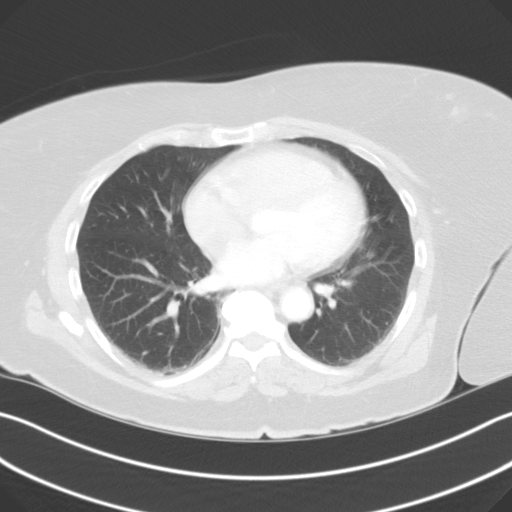
[im 37/67  lung]
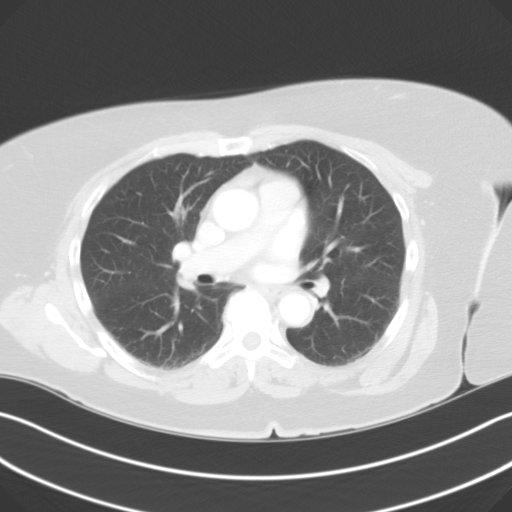
[im 42/67  lung]
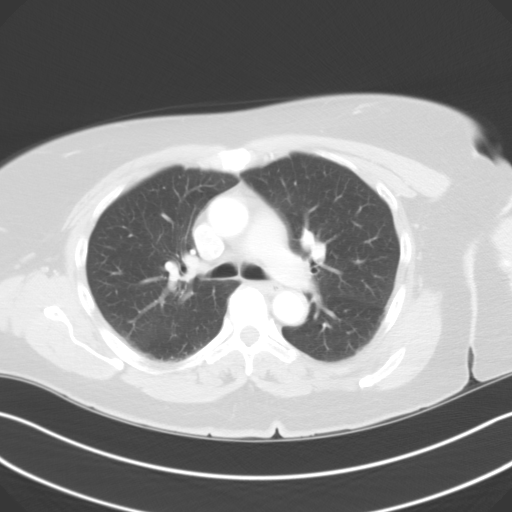
[im 47/67  mediastinal]
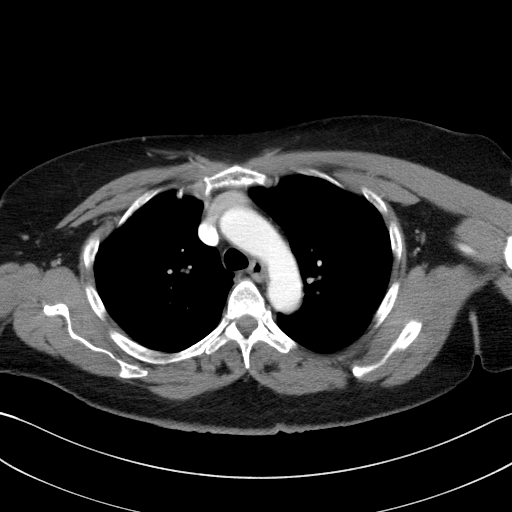
[im 47/67  lung]
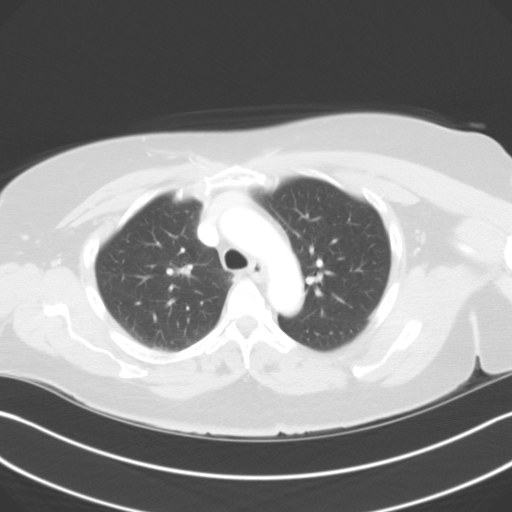
[im 52/67  lung]
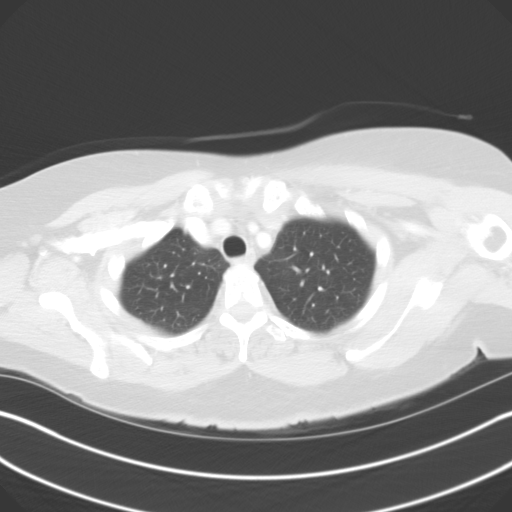
[im 57/67  lung]
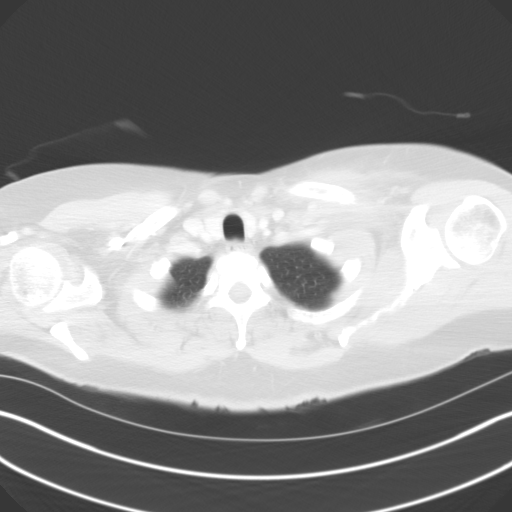
[im 62/67  lung]
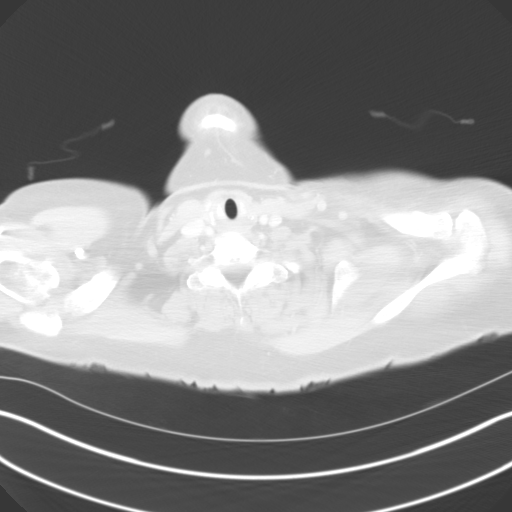

[Series 5: coronal · coronal · 0.65mm/px · 3 of 82 slices shown]
[im 17/82  lung]
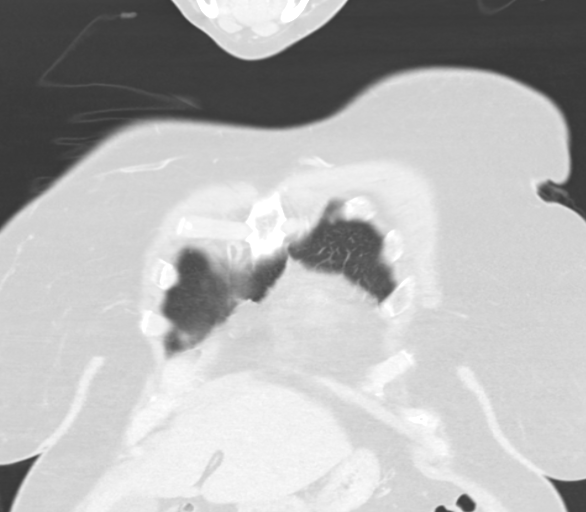
[im 33/82  lung]
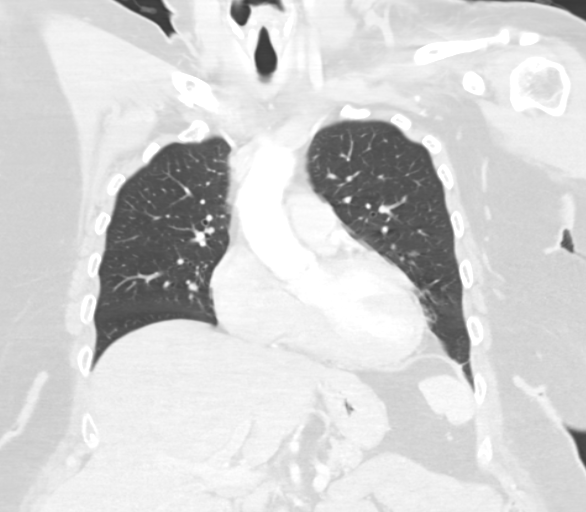
[im 49/82  lung]
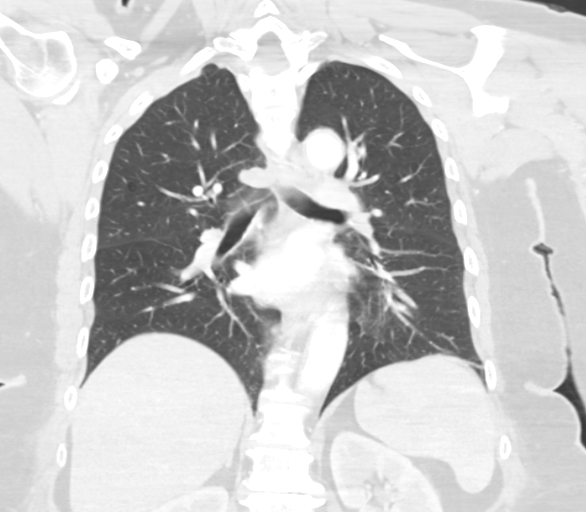

[15 of 36 positions shown; findings below may reference images not displayed]

FINDINGS: The lungs are well expanded and clear. Negative for pneumonia or
effusion. Negative for mass or adenopathy in the chest. No lung
nodule or evidence metastatic disease in the lungs identified.

Heart size upper normal. No significant coronary calcification.
Aortic arch normal.

No obvious breast mass or axillary adenopathy

Upper abdomen negative for mass lesion.

Nondisplaced fracture mid clavicle. There is a cystic lesion in the
mid clavicle with scalloped margins and cortical thinning . There is
a coarsened trabecular pattern in the medial left clavicle . No
periosteal reaction. No aggressive bony destruction. Soft tissue
stranding around the fracture compatible with mild hemorrhage. No
other bone lesions identified in the spine or ribs.
IMPRESSION: Pathologic fracture left mid clavicle. There is a medullary cystic
lesion in the mid clavicle through the fracture. There are coarsened
trabecula in the medial left clavicle. No aggressive features. This
may represent a benign process such as fibrous dysplasia or Paget's
disease.

Negative for malignancy in the chest.

## 2016-08-16 DIAGNOSIS — R05 Cough: Secondary | ICD-10-CM | POA: Diagnosis not present

## 2016-08-30 DIAGNOSIS — M1611 Unilateral primary osteoarthritis, right hip: Secondary | ICD-10-CM | POA: Diagnosis not present

## 2016-08-30 DIAGNOSIS — M25561 Pain in right knee: Secondary | ICD-10-CM | POA: Diagnosis not present

## 2016-08-30 DIAGNOSIS — Z96642 Presence of left artificial hip joint: Secondary | ICD-10-CM | POA: Diagnosis not present

## 2016-08-30 DIAGNOSIS — Z471 Aftercare following joint replacement surgery: Secondary | ICD-10-CM | POA: Diagnosis not present

## 2016-08-30 DIAGNOSIS — G8929 Other chronic pain: Secondary | ICD-10-CM | POA: Diagnosis not present

## 2016-09-04 ENCOUNTER — Telehealth: Payer: Self-pay | Admitting: *Deleted

## 2016-09-04 NOTE — Telephone Encounter (Signed)
She was asymptomatic from a cardiac standpoint. She has had a negative cardiac evaluation. She'll be a low risk patient for low risk surgery. Okay to stop aspirin. No need for additional cardiac evaluation.  Glenetta Hew, MD

## 2016-09-04 NOTE — Telephone Encounter (Signed)
DEFER TO DR DAVID HARDING   LAST CARDIOLOGY OFFICE VISIT  10/23/12017     Request for surgical clearance:  1. What type of surgery is being performed? Right Hip:THA w/wo autograft/allograft  2. When is this surgery scheduled? July 11,2018   3. Are there any medications that need to be held prior to surgery and how long? ASPIRIN  4. Name of physician performing surgery? DR Gaynelle Arabian  5. What is your office phone and fax number? PHONE [phone]  FAX [phone] ATTN VELVET [facility]

## 2016-09-05 NOTE — Telephone Encounter (Signed)
ROUTED INFORMATION TO DR ALUISIO'S OFFICE

## 2016-09-11 ENCOUNTER — Other Ambulatory Visit (HOSPITAL_COMMUNITY): Payer: Self-pay | Admitting: Surgery

## 2016-09-18 ENCOUNTER — Other Ambulatory Visit: Payer: Self-pay | Admitting: Cardiology

## 2016-09-24 ENCOUNTER — Encounter (HOSPITAL_COMMUNITY)
Admission: RE | Admit: 2016-09-24 | Discharge: 2016-09-24 | Disposition: A | Discharge status: Home / Self Care | Payer: Medicare HMO | Source: Ambulatory Visit | Attending: Surgery | Admitting: Surgery

## 2016-09-24 MED ORDER — TECHNETIUM TC 99M SESTAMIBI GENERIC - CARDIOLITE
25.0000 | Freq: Once | INTRAVENOUS | Status: AC | PRN
  Administered 2016-09-24: 25 via INTRAVENOUS

## 2016-09-30 ENCOUNTER — Other Ambulatory Visit: Payer: Self-pay | Admitting: Surgery

## 2016-10-01 ENCOUNTER — Ambulatory Visit
Admission: RE | Admit: 2016-10-01 | Discharge: 2016-10-01 | Disposition: A | Discharge status: Home / Self Care | Payer: Medicare HMO | Source: Ambulatory Visit | Attending: Surgery | Admitting: Surgery

## 2016-10-01 DIAGNOSIS — Z79899 Other long term (current) drug therapy: Secondary | ICD-10-CM | POA: Diagnosis not present

## 2016-10-01 DIAGNOSIS — E041 Nontoxic single thyroid nodule: Secondary | ICD-10-CM | POA: Diagnosis not present

## 2016-10-01 DIAGNOSIS — Z01812 Encounter for preprocedural laboratory examination: Secondary | ICD-10-CM | POA: Diagnosis not present

## 2016-10-01 DIAGNOSIS — R7303 Prediabetes: Secondary | ICD-10-CM | POA: Diagnosis not present

## 2016-10-01 DIAGNOSIS — E210 Primary hyperparathyroidism: Secondary | ICD-10-CM | POA: Diagnosis not present

## 2016-10-01 DIAGNOSIS — I10 Essential (primary) hypertension: Secondary | ICD-10-CM | POA: Diagnosis not present

## 2016-10-01 DIAGNOSIS — M1990 Unspecified osteoarthritis, unspecified site: Secondary | ICD-10-CM | POA: Diagnosis not present

## 2016-10-01 DIAGNOSIS — Z01818 Encounter for other preprocedural examination: Secondary | ICD-10-CM | POA: Diagnosis not present

## 2016-10-01 NOTE — Progress Notes (Signed)
Please place orders in EPIC as patient is being scheduled for a Pre-op appointment! Thank you! 

## 2016-10-03 ENCOUNTER — Encounter: Payer: Self-pay | Admitting: Internal Medicine

## 2016-10-03 NOTE — Progress Notes (Signed)
I received labs from PCP from 10/01/2016:  HbA1c 6.1%  CBC normal, exc. Slightly low RBC  CMP normal, except glucose 113. Calcium was high at 11.2, but corrected 10.65 (8.6-10.3), BUN/creatinine 15/0.62, eGFR 96.  TSH 2.11

## 2016-10-04 ENCOUNTER — Ambulatory Visit: Payer: Self-pay | Admitting: Orthopedic Surgery

## 2016-10-09 ENCOUNTER — Encounter: Payer: Self-pay | Admitting: Cardiology

## 2016-10-09 ENCOUNTER — Ambulatory Visit (INDEPENDENT_AMBULATORY_CARE_PROVIDER_SITE_OTHER): Payer: Medicare HMO | Admitting: Cardiology

## 2016-10-09 ENCOUNTER — Other Ambulatory Visit: Payer: Self-pay | Admitting: Internal Medicine

## 2016-10-09 VITALS — BP 140/92 | HR 74 | Ht 64.0 in | Wt 231.0 lb

## 2016-10-09 DIAGNOSIS — I493 Ventricular premature depolarization: Secondary | ICD-10-CM | POA: Diagnosis not present

## 2016-10-09 DIAGNOSIS — I10 Essential (primary) hypertension: Secondary | ICD-10-CM | POA: Diagnosis not present

## 2016-10-09 DIAGNOSIS — E782 Mixed hyperlipidemia: Secondary | ICD-10-CM | POA: Diagnosis not present

## 2016-10-09 DIAGNOSIS — Z01810 Encounter for preprocedural cardiovascular examination: Secondary | ICD-10-CM

## 2016-10-09 NOTE — Patient Instructions (Addendum)
No changes with current treatment or mediations    Cardiac clearance for hip surgery - low risk    Your physician recommends that you schedule a follow-up appointment in OCT 2018 [place].

## 2016-10-09 NOTE — Progress Notes (Signed)
PCP: Jonathon Jordan, MD  Clinic Note: Chief Complaint  Patient presents with  . Pre-op Exam     for right hip replacement    HPI: Alicia Schroeder is a 66 y.o. female with a PMH below who presents today for preoperative evaluation. She has a strong family history of CAD and herself has hypertension and hyperlipidemia. I first met her when I was doing cardiovascular risk assessment for a upcoming surgery.  She has a a significant family history of CAD -- (father w/ initial onset of disease was in his 18s, he had carotid endarterectomy as well as 3 vessel CABG plus valve replacement. He eventually developed ischemic cardiomyopathy). She has a history of PVCs occasionally and trigeminy pattern. We  Have beentreating her with acebutolol which seems to control her symptoms.  Nikky Duba was last seen on 03/11/2016. She was doing very well with major complaints. She was noted Right hip pain. Palpitations controlled with acebutolol.  Recent Hospitalizations: n/a  Studies Personally Reviewed - (if available, images/films reviewed: From Epic Chart or Care Everywhere)  n/a  Interval History:  Kinslie returns today overall doing fairly wel Biggest problem is that she now has significant pain in the right hip pain probably require replacement surgery soon. She has told me about being evaluated for possible thyroid nodule and hyper calciuria.She is in place on high dose vitamin D and there was concern for  Hyperparathyroidism.   Now she is seeing Dr. Letta Median from Endocrinology.   most notably from a cardiac standpoint, she denies any recurrence of palpitations. She is limited as far as walking goes because her hip pain, now using a cane. However she has not had any resting or exertional chest tightness or pressure. No PND, orthopnea or edema.  No palpitations, lightheadedness, dizziness, weakness or syncope/near syncope. No TIA/amaurosis fugax symptoms. No claudication.  ROS: A comprehensive was  performed. Review of Systems  Constitutional: Negative for malaise/fatigue.  HENT: Negative for nosebleeds.   Respiratory: Negative.   Cardiovascular:       Per history of present illness  Gastrointestinal: Negative for blood in stool and melena.  Genitourinary: Negative for hematuria.  Musculoskeletal: Positive for joint pain (Right hip).  Skin: Negative.   Neurological: Negative for dizziness and focal weakness.  Endo/Heme/Allergies: Negative for environmental allergies.  Psychiatric/Behavioral: Negative for memory loss. The patient is nervous/anxious. The patient does not have insomnia.   All other systems reviewed and are negative.  I have reviewed and (if needed) personally updated the patient's problem list, medications, allergies, past medical and surgical history, social and family history.   Past Medical History:  Diagnosis Date  . Abnormal ECG   . Aortic valve sclerosis    no evidence per patient   . Arthritis     right greater the left hip  . Cancer ([place])    basal cell carcinoma removed- 24 years ago   . Depression    no meds  . Dysrhythmia    has soft Systolic Ejection Murmur  . Family history of anesthesia complication    sisterin law disabled from anesthesia   . Family history of premature coronary artery disease    Father with extensive coronary and carotid disease  . Hyperlipidemia   . Hypertension   . Pneumonia    15 years ago approximately   . Premature ventricular contractions     Past Surgical History:  Procedure Laterality Date  . ANKLE FRACTURE SURGERY Left 12-09-14  . APPLICATION OF A-CELL OF EXTREMITY  Left 03/27/2015   Procedure: APPLICATION OF A-CELL OF EXTREMITY;  Surgeon: Wallace Going, DO;  Location: [facility];  Service: Plastics;  Laterality: Left;  . I&D EXTREMITY Left 03/27/2015   Procedure: IRRIGATION AND DEBRIDEMENT LEFT ANKLE ;  Surgeon: Wallace Going, DO;  Location: [facility];  Service:  Plastics;  Laterality: Left;  . JOINT REPLACEMENT Left   . NM LEXISCAN MYOVIEW LTD  08/03/2013   Normal study. No ischemia or infarction. Normal wall motion. EF 71%  . TOTAL HIP ARTHROPLASTY Left 09/29/2013   Procedure: LEFT TOTAL HIP ARTHROPLASTY ANTERIOR APPROACH;  Surgeon: Gearlean Alf, MD;  Location: [facility];  Service: Orthopedics;  Laterality: Left;  . TRANSTHORACIC ECHOCARDIOGRAM  08/05/2013   Normal LV size and function. EF 60-65%. No regional WMA; grade 1 diastolic dysfunction with mild LA dilation; no significant valvular lesions -- mild aortic valve calcification/sclerosis (could explain a soft systolic murmur)    Current Meds  Medication Sig  . acebutolol (SECTRAL) 200 MG capsule TAKE ONE CAPSULE BY MOUTH TWICE DAILY  . aspirin EC 325 MG tablet Take 1 tablet (325 mg total) by mouth daily.  Marland Kitchen atorvastatin (LIPITOR) 20 MG tablet Take 20 mg by mouth daily at 6 PM. Patient takes in pm  . celecoxib (CELEBREX) 200 MG capsule Take 200 mg by mouth daily.  . cetirizine (ZYRTEC) 10 MG tablet Take 10 mg by mouth every evening.   . Cholecalciferol (VITAMIN D3) 2000 units capsule Take 10,000 Units by mouth every morning.   Marland Kitchen co-enzyme Q-10 50 MG capsule Take 50 mg by mouth daily.  Marland Kitchen lisinopril (PRINIVIL,ZESTRIL) 20 MG tablet Take 20 mg by mouth every evening.   . Magnesium Oxide 400 MG CAPS Take 400 mg by mouth every morning.   . Misc Natural Products (OSTEO BI-FLEX TRIPLE STRENGTH PO) Take 1 tablet by mouth 2 (two) times daily.  Marland Kitchen omega-3 acid ethyl esters (LOVAZA) 1 G capsule Take 1 g by mouth every morning.   . Turmeric 500 MG CAPS Take 1 tablet by mouth 2 (two) times daily.    No Known Allergies  Social History   Social History  . Marital status: Divorced    Spouse name: N/A  . Number of children: N/A  . Years of education: N/A   Social History Main Topics  . Smoking status: Never Smoker  . Smokeless tobacco: Never Used  . Alcohol use Yes     Comment: occasional   . Drug  use: No  . Sexual activity: Not Asked   Other Topics Concern  . None   Social History Narrative   She is a divorced mother of 2. She's been divorced for 10 years. She does long with her dog. 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    She's never smoked. Does not drink alcohol.    she does daily exercises for her hips 7 nasal week for 20 minutes at a time. This is mostly stretching and. Because of her hip pain she is no longer able to walk like she used to.    Phone [phone]   Children: Shauntee K[place], [place] - [phone]); Eugenia Eld[place] [phone] since about.     family history includes Arrhythmia in her maternal grandfather; Heart disease in her paternal grandmother; Heart disease (age of onset: 45) in her father; Heart failure in her mother; Hyperlipidemia in her father; Hypertension in her father, mother, and paternal grandfather; Kidney failure  in her father; Parkinson's disease in her mother and paternal grandfather; Stroke in her maternal grandmother; Thyroid disease in her mother; Transient ischemic attack in her maternal grandmother.  Wt Readings from Last 3 Encounters:  10/09/16 231 lb (104.8 kg)  07/31/16 240 lb (108.9 kg)  03/11/16 244 lb (110.7 kg)    PHYSICAL EXAM BP (!) 140/92   Pulse 74   Ht 5\' 4"  (1.626 m)   Wt 231 lb (104.8 kg)   BMI 39.65 kg/m  General appearance: alert, cooperative, appears stated age, no distress.  Moderate/morbidly obese Well-nourished well-groomed HEENT: [place]/AT, EOMI, MMM, anicteric sclera Neck: no adenopathy, no carotid bruit and no JVD Lungs: clear to auscultation bilaterally, normal percussion bilaterally and non-labored Heart: regular rate and rhythm, S1 &S2 normal, no click, rub or gallop; soft 1/6 SEM At RUSB.  Nondisplaced PMI Abdomen: soft, non-tender; bowel sounds normal; no masses,  no organomegaly; no HJR. Mild truncal obesity Extremities: extremities normal, atraumatic, no  cyanosis, and edema -  trivial Pulses: 2+ and symmetric Skin: no evidence of bleeding or bruising, no lesions noted, temperature normal and texture normal  Neurologic: Mental status: Alert & oriented x 3, thought content appropriate; non-focal exam.  Pleasant mood & affect. Answers questions appropriately.  Antalgic gait Walks with cane    Adult ECG Report  Rate:  74 ;  Rhythm: normal sinus rhythm and inc RBBB, otherwise normal axis, intervals and durations.;   Narrative Interpretation:  Stable EKG   Other studies Reviewed: Additional studies/ records that were reviewed today include:  Recent Labs:  No recent labs    ASSESSMENT / PLAN: Problem List Items Addressed This Visit    Essential hypertension (Chronic)    Fairly well controlled on  Lisinopril and acebutolol. Slightly elevated today because she is in pain walking to the clinic.      Relevant Orders   EKG 12-Lead   Mixed hyperlipidemia (Chronic)     Labs were monitored by PCP. She is on atorvastatin Without myalgias.      Preoperative cardiovascular examination     She has had a negative Myoview in the last 5 years and has no anginal symptoms. She has no heart failure symptoms were relatively normal echocardiogram. She does not have diabetes, has normal renal function, and no stroke history. Hip arthroplasty, albeit a major surgery is not high risk from a cardiac standpoint.  PREOPERATIVE CARDIAC RISK ASSESSMENT   Revised Cardiac Risk Index:  High Risk Surgery: no;   Defined as Intraperitoneal, intrathoracic or suprainguinal vascular  Active CAD: no;   CHF: no;   Cerebrovascular Disease: no;   Diabetes: no; On Insulin: no  CKD (Cr >~ 2): no;   Total: 0 Estimated Risk of Adverse Outcome: LOW RISK for LOW RISK SURGERY  Estimated Risk of MI, PE, VF/VT (Cardiac Arrest), Complete Heart Block: <1 %   ACC/AHA Guidelines for "Clearance":  Step 1 - Need for Emergency Surgery: No:   If Yes - go straight to OR  with perioperative surveillance  Step 2 - Active Cardiac Conditions (Unstable Angina, Decompensated HF, Significant  Arrhytmias - Complete HB, Mobitz II, Symptomatic VT or SVT, Severe Aortic Stenosis - mean gradient > 40 mmHg, Valve area < 1.0 cm2):   No:   If Yes - Evaluate & Treat per ACC/AHA Guidelines  Step 3 -  Low Risk Surgery: Yes  If Yes --> proceed to OR  If No --> Step 4  Step 4 - Functional Capacity >= 4 METS without  symptoms: Yes - more limited by hip pain.  If Yes --> proceed to OR  If No --> Step 5  Step 5 --  Clinical Risk Factors (CRF)   3 or more: No: 0  If Yes -- assess Surgical Risk, --   (High Risk Non-cardiac), Intraabdominal or thoracic vascular surgery consider testing if it will change management.  Intermediate Risk: Proceed to OR with HR control, or consider testing if it will change management  No CRFs: Yes: See above.  If Yes --> Proceed to OR   Based on the above assessment, recommendation is to proceed to the OR without any further cardiology evaluation.  I would continue acebutolol perioperatively.      PVC's (premature ventricular contractions) (Chronic)    Well-controlled now. She is reduce her caffeine intake and is on stable dose of acebutolol. Would continue this perioperatively.      Relevant Orders   EKG 12-Lead    Other Visit Diagnoses    Pre-operative cardiovascular examination    -  Primary   Relevant Orders   EKG 12-Lead      Current medicines are reviewed at length with the patient today. (+/- concerns) n/a The following changes have been made: n/a  Patient Instructions  No changes with current treatment or mediations    Cardiac clearance for hip surgery - low risk    Your physician recommends that you schedule a follow-up appointment in OCT 2018 [place].    Studies Ordered:   Orders Placed This Encounter  Procedures  . EKG 12-Lead      Glenetta Hew, M.D., M.S. Interventional  Cardiologist   Pager # [phone] Phone # [phone] [address] [place], [place] [address]

## 2016-10-11 ENCOUNTER — Encounter: Payer: Self-pay | Admitting: Cardiology

## 2016-10-11 ENCOUNTER — Encounter (HOSPITAL_COMMUNITY): Payer: Self-pay

## 2016-10-11 NOTE — Assessment & Plan Note (Signed)
She has had a negative Myoview in the last 5 years and has no anginal symptoms. She has no heart failure symptoms were relatively normal echocardiogram. She does not have diabetes, has normal renal function, and no stroke history. Hip arthroplasty, albeit a major surgery is not high risk from a cardiac standpoint.  PREOPERATIVE CARDIAC RISK ASSESSMENT   Revised Cardiac Risk Index:  High Risk Surgery: no;   Defined as Intraperitoneal, intrathoracic or suprainguinal vascular  Active CAD: no;   CHF: no;   Cerebrovascular Disease: no;   Diabetes: no; On Insulin: no  CKD (Cr >~ 2): no;   Total: 0 Estimated Risk of Adverse Outcome: LOW RISK for LOW RISK SURGERY  Estimated Risk of MI, PE, VF/VT (Cardiac Arrest), Complete Heart Block: <1 %   ACC/AHA Guidelines for "Clearance":  Step 1 - Need for Emergency Surgery: No:   If Yes - go straight to OR with perioperative surveillance  Step 2 - Active Cardiac Conditions (Unstable Angina, Decompensated HF, Significant  Arrhytmias - Complete HB, Mobitz II, Symptomatic VT or SVT, Severe Aortic Stenosis - mean gradient > 40 mmHg, Valve area < 1.0 cm2):   No:   If Yes - Evaluate & Treat per ACC/AHA Guidelines  Step 3 -  Low Risk Surgery: Yes  If Yes --> proceed to OR  If No --> Step 4  Step 4 - Functional Capacity >= 4 METS without symptoms: Yes - more limited by hip pain.  If Yes --> proceed to OR  If No --> Step 5  Step 5 --  Clinical Risk Factors (CRF)   3 or more: No: 0  If Yes -- assess Surgical Risk, --   (High Risk Non-cardiac), Intraabdominal or thoracic vascular surgery consider testing if it will change management.  Intermediate Risk: Proceed to OR with HR control, or consider testing if it will change management  No CRFs: Yes: See above.  If Yes --> Proceed to OR   Based on the above assessment, recommendation is to proceed to the OR without any further cardiology evaluation.  I would continue acebutolol  perioperatively.

## 2016-10-11 NOTE — Assessment & Plan Note (Signed)
Fairly well controlled on  Lisinopril and acebutolol. Slightly elevated today because she is in pain walking to the clinic.

## 2016-10-11 NOTE — Assessment & Plan Note (Addendum)
Labs were monitored by PCP. She is on atorvastatin Without myalgias.

## 2016-10-11 NOTE — Assessment & Plan Note (Signed)
Well-controlled now. She is reduce her caffeine intake and is on stable dose of acebutolol. Would continue this perioperatively.

## 2016-10-16 ENCOUNTER — Other Ambulatory Visit: Payer: Self-pay | Admitting: Surgery

## 2016-10-16 DIAGNOSIS — R739 Hyperglycemia, unspecified: Secondary | ICD-10-CM

## 2016-10-16 NOTE — Patient Instructions (Addendum)
Alicia Schroeder  10/16/2016   Your procedure is scheduled on: 10-23-16   Report to [facility] Main Entrance Report to Admitting at 1:30 PM   Call this number if you have problems the morning of surgery  [phone]   Remember: ONLY 1 PERSON MAY GO WITH YOU TO SHORT STAY TO GET  READY MORNING OF [place].  Do not eat food or drink liquids :After Midnight. You may have a Clear Liquid Diet from Midnight until 10:00 AM. After 10:00 AM, nothing by mouth.      CLEAR LIQUID DIET   Foods Allowed                                                                     Foods Excluded  Coffee and tea, regular and decaf                             liquids that you cannot  Plain Jell-O in any flavor                                             see through such as: Fruit ices (not with fruit pulp)                                     milk, soups, orange juice  Iced Popsicles                                    All solid food Carbonated beverages, regular and diet                                    Cranberry, grape and apple juices Sports drinks like Gatorade Lightly seasoned clear broth or consume(fat free) Sugar, honey syrup  Sample Menu Breakfast                                Lunch                                     Supper Cranberry juice                    Beef broth                            Chicken broth Jell-O                                     Grape juice  Apple juice Coffee or tea                        Jell-O                                      Popsicle                                                Coffee or tea                        Coffee or tea  _____________________________________________________________________    Take these medicines the morning of surgery with A SIP OF WATER: Acebutolol (Sectral)                               You may not have any metal on your body including hair pins and              piercings  Do not  wear jewelry, make-up, lotions, powders or perfumes, deodorant             Do not wear nail polish.  Do not shave  48 hours prior to surgery.                 Do not bring valuables to the hospital. [place].  Contacts, dentures or bridgework may not be worn into surgery.  Leave suitcase in the car. After surgery it may be brought to your room.                 Please read over the following fact sheets you were given: _____________________________________________________________________             [facility] - Preparing for Surgery Before surgery, you can play an important role.  Because skin is not sterile, your skin needs to be as free of germs as possible.  You can reduce the number of germs on your skin by washing with CHG (chlorahexidine gluconate) soap before surgery.  CHG is an antiseptic cleaner which kills germs and bonds with the skin to continue killing germs even after washing. Please DO NOT use if you have an allergy to CHG or antibacterial soaps.  If your skin becomes reddened/irritated stop using the CHG and inform your nurse when you arrive at Short Stay. Do not shave (including legs and underarms) for at least 48 hours prior to the first CHG shower.  You may shave your face/neck. Please follow these instructions carefully:  1.  Shower with CHG Soap the night before surgery and the  morning of Surgery.  2.  If you choose to wash your hair, wash your hair first as usual with your  normal  shampoo.  3.  After you shampoo, rinse your hair and body thoroughly to remove the  shampoo.                           4.  Use CHG as you would any other liquid soap.  You can apply chg directly  to the skin and wash                       Gently with a scrungie or clean washcloth.  5.  Apply the CHG Soap to your body ONLY FROM THE NECK DOWN.   Do not use on face/ open                           Wound or open sores. Avoid contact with eyes,  ears mouth and genitals (private parts).                       Wash face,  Genitals (private parts) with your normal soap.             6.  Wash thoroughly, paying special attention to the area where your surgery  will be performed.  7.  Thoroughly rinse your body with warm water from the neck down.  8.  DO NOT shower/wash with your normal soap after using and rinsing off  the CHG Soap.                9.  Pat yourself dry with a clean towel.            10.  Wear clean pajamas.            11.  Place clean sheets on your bed the night of your first shower and do not  sleep with pets. Day of Surgery : Do not apply any lotions/deodorants the morning of surgery.  Please wear clean clothes to the hospital/surgery center.  FAILURE TO FOLLOW THESE INSTRUCTIONS MAY RESULT IN THE CANCELLATION OF YOUR SURGERY PATIENT SIGNATURE_________________________________  NURSE SIGNATURE__________________________________  ________________________________________________________________________   [name]  An incentive spirometer is a tool that can help keep your lungs clear and active. This tool measures how well you are filling your lungs with each breath. Taking long deep breaths may help reverse or decrease the chance of developing breathing (pulmonary) problems (especially infection) following:  A long period of time when you are unable to move or be active. BEFORE THE PROCEDURE   If the spirometer includes an indicator to show your best effort, your nurse or respiratory therapist will set it to a desired goal.  If possible, sit up straight or lean slightly forward. Try not to slouch.  Hold the incentive spirometer in an upright position. INSTRUCTIONS FOR USE  1. Sit on the edge of your bed if possible, or sit up as far as you can in bed or on a chair. 2. Hold the incentive spirometer in an upright position. 3. Breathe out normally. 4. Place the mouthpiece in your mouth and seal your lips  tightly around it. 5. Breathe in slowly and as deeply as possible, raising the piston or the ball toward the top of the column. 6. Hold your breath for 3-5 seconds or for as long as possible. Allow the piston or ball to fall to the bottom of the column. 7. Remove the mouthpiece from your mouth and breathe out normally. 8. Rest for a few seconds and repeat Steps 1 through 7 at least 10 times every 1-2 hours when you are awake. Take your time and take a few normal breaths between deep breaths. 9. The spirometer may include an indicator to show your best effort. Use the indicator as a goal to work toward during each repetition. 10. After  each set of 10 deep breaths, practice coughing to be sure your lungs are clear. If you have an incision (the cut made at the time of surgery), support your incision when coughing by placing a pillow or rolled up towels firmly against it. Once you are able to get out of bed, walk around indoors and cough well. You may stop using the incentive spirometer when instructed by your caregiver.  RISKS AND COMPLICATIONS  Take your time so you do not get dizzy or light-headed.  If you are in pain, you may need to take or ask for pain medication before doing incentive spirometry. It is harder to take a deep breath if you are having pain. AFTER USE  Rest and breathe slowly and easily.  It can be helpful to keep track of a log of your progress. Your caregiver can provide you with a simple table to help with this. If you are using the spirometer at home, follow these instructions: [place] IF:   You are having difficultly using the spirometer.  You have trouble using the spirometer as often as instructed.  Your pain medication is not giving enough relief while using the spirometer.  You develop fever of 100.5 F (38.1 C) or higher. SEEK IMMEDIATE MEDICAL CARE IF:   You cough up bloody sputum that had not been present before.  You develop fever of 102 F  (38.9 C) or greater.  You develop worsening pain at or near the incision site. MAKE SURE YOU:   Understand these instructions.  Will watch your condition.  Will get help right away if you are not doing well or get worse. Document Released: 09/16/2006 Document Revised: 07/29/2011 Document Reviewed: 11/17/2006 ExitCare Patient Information 2014 ExitCare, [place].   ________________________________________________________________________  WHAT IS A BLOOD TRANSFUSION? Blood Transfusion Information  A transfusion is the replacement of blood or some of its parts. Blood is made up of multiple cells which provide different functions.  Red blood cells carry oxygen and are used for blood loss replacement.  White blood cells fight against infection.  Platelets control bleeding.  Plasma helps clot blood.  Other blood products are available for specialized needs, such as hemophilia or other clotting disorders. BEFORE THE TRANSFUSION  Who gives blood for transfusions?   Healthy volunteers who are fully evaluated to make sure their blood is safe. This is blood bank blood. Transfusion therapy is the safest it has ever been in the practice of medicine. Before blood is taken from a donor, a complete history is taken to make sure that person has no history of diseases nor engages in risky social behavior (examples are intravenous drug use or sexual activity with multiple partners). The donor's travel history is screened to minimize risk of transmitting infections, such as malaria. The donated blood is tested for signs of infectious diseases, such as HIV and hepatitis. The blood is then tested to be sure it is compatible with you in order to minimize the chance of a transfusion reaction. If you or a relative donates blood, this is often done in anticipation of surgery and is not appropriate for emergency situations. It takes many days to process the donated blood. RISKS AND COMPLICATIONS Although  transfusion therapy is very safe and saves many lives, the main dangers of transfusion include:   Getting an infectious disease.  Developing a transfusion reaction. This is an allergic reaction to something in the blood you were given. Every precaution is taken to prevent this. The decision to  have a blood transfusion has been considered carefully by your caregiver before blood is given. Blood is not given unless the benefits outweigh the risks. AFTER THE TRANSFUSION  Right after receiving a blood transfusion, you will usually feel much better and more energetic. This is especially true if your red blood cells have gotten low (anemic). The transfusion raises the level of the red blood cells which carry oxygen, and this usually causes an energy increase.  The nurse administering the transfusion will monitor you carefully for complications. HOME CARE INSTRUCTIONS  No special instructions are needed after a transfusion. You may find your energy is better. Speak with your caregiver about any limitations on activity for underlying diseases you may have. SEEK MEDICAL CARE IF:   Your condition is not improving after your transfusion.  You develop redness or irritation at the intravenous (IV) site. SEEK IMMEDIATE MEDICAL CARE IF:  Any of the following symptoms occur over the next 12 hours:  Shaking chills.  You have a temperature by mouth above 102 F (38.9 C), not controlled by medicine.  Chest, back, or muscle pain.  People around you feel you are not acting correctly or are confused.  Shortness of breath or difficulty breathing.  Dizziness and fainting.  You get a rash or develop hives.  You have a decrease in urine output.  Your urine turns a dark color or changes to pink, red, or brown. Any of the following symptoms occur over the next 10 days:  You have a temperature by mouth above 102 F (38.9 C), not controlled by medicine.  Shortness of breath.  Weakness after normal  activity.  The white part of the eye turns yellow (jaundice).  You have a decrease in the amount of urine or are urinating less often.  Your urine turns a dark color or changes to pink, red, or brown. Document Released: 05/03/2000 Document Revised: 07/29/2011 Document Reviewed: 12/21/2007 [facility] Patient Information 2014 [place], [place].  _______________________________________________________________________

## 2016-10-16 NOTE — Progress Notes (Signed)
10-09-16 (EPIC) Surgical Clearance from Dr. Ellyn Hack in office note 10-09-16 [facility]) EKG

## 2016-10-17 ENCOUNTER — Encounter (HOSPITAL_COMMUNITY): Payer: Self-pay

## 2016-10-17 ENCOUNTER — Encounter (HOSPITAL_COMMUNITY)
Admission: RE | Admit: 2016-10-17 | Discharge: 2016-10-17 | Disposition: A | Discharge status: Home / Self Care | Payer: Medicare HMO | Source: Ambulatory Visit | Attending: Orthopedic Surgery | Admitting: Orthopedic Surgery

## 2016-10-17 DIAGNOSIS — Z01812 Encounter for preprocedural laboratory examination: Secondary | ICD-10-CM | POA: Insufficient documentation

## 2016-10-17 DIAGNOSIS — M1611 Unilateral primary osteoarthritis, right hip: Secondary | ICD-10-CM | POA: Insufficient documentation

## 2016-10-17 DIAGNOSIS — Z0183 Encounter for blood typing: Secondary | ICD-10-CM | POA: Insufficient documentation

## 2016-10-17 LAB — CBC
HCT: 38.9 % (ref 36.0–46.0)
HEMOGLOBIN: 12.6 g/dL (ref 12.0–15.0)
MCH: 29.5 pg (ref 26.0–34.0)
MCHC: 32.4 g/dL (ref 30.0–36.0)
MCV: 91.1 fL (ref 78.0–100.0)
Platelets: 327 10*3/uL (ref 150–400)
RBC: 4.27 MIL/uL (ref 3.87–5.11)
RDW: 13.8 % (ref 11.5–15.5)
WBC: 6.8 10*3/uL (ref 4.0–10.5)

## 2016-10-17 LAB — COMPREHENSIVE METABOLIC PANEL
ALBUMIN: 4.5 g/dL (ref 3.5–5.0)
ALK PHOS: 85 U/L (ref 38–126)
ALT: 19 U/L (ref 14–54)
AST: 23 U/L (ref 15–41)
Anion gap: 9 (ref 5–15)
BUN: 17 mg/dL (ref 6–20)
CALCIUM: 10.8 mg/dL — AB (ref 8.9–10.3)
CO2: 29 mmol/L (ref 22–32)
CREATININE: 0.66 mg/dL (ref 0.44–1.00)
Chloride: 106 mmol/L (ref 101–111)
GFR calc Af Amer: >60 mL/min (ref >60)
GFR calc non Af Amer: >60 mL/min (ref >60)
Glucose, Bld: 109 mg/dL — ABNORMAL HIGH (ref 65–99)
Potassium: 4.6 mmol/L (ref 3.5–5.1)
SODIUM: 144 mmol/L (ref 135–145)
Total Bilirubin: 0.8 mg/dL (ref 0.3–1.2)
Total Protein: 7.3 g/dL (ref 6.5–8.1)

## 2016-10-17 LAB — APTT: APTT: 36 s (ref 24–36)

## 2016-10-17 LAB — SURGICAL PCR SCREEN
MRSA, PCR: NEGATIVE
STAPHYLOCOCCUS AUREUS: NEGATIVE

## 2016-10-17 LAB — PROTIME-INR
INR: 1
Prothrombin Time: 13.2 seconds (ref 11.4–15.2)

## 2016-10-17 NOTE — Progress Notes (Signed)
Cardiac clearance on chart from Dr. Ellyn Hack. Pt brought letter to preadmit appointment.

## 2016-10-18 ENCOUNTER — Ambulatory Visit
Admission: RE | Admit: 2016-10-18 | Discharge: 2016-10-18 | Disposition: A | Discharge status: Home / Self Care | Payer: Medicare HMO | Source: Ambulatory Visit | Attending: Surgery | Admitting: Surgery

## 2016-10-18 DIAGNOSIS — R490 Dysphonia: Secondary | ICD-10-CM | POA: Diagnosis not present

## 2016-10-18 DIAGNOSIS — R739 Hyperglycemia, unspecified: Secondary | ICD-10-CM

## 2016-10-18 MED ORDER — IOPAMIDOL (ISOVUE-370) INJECTION 76%
75.0000 mL | Freq: Once | INTRAVENOUS | Status: AC | PRN
  Administered 2016-10-18: 75 mL via INTRAVENOUS

## 2016-10-20 ENCOUNTER — Ambulatory Visit: Payer: Self-pay | Admitting: Orthopedic Surgery

## 2016-10-20 NOTE — H&P (Signed)
Alicia Schroeder DOB: [birth year] Divorced / Language: English / Race: White Female Date of Admission:  10/23/16 CC:  Right hip pain History of Present Illness The patient is a 66 year old female who comes in for a preoperative History and Physical. The patient is scheduled for a right total hip arthroplasty (anterior) to be performed by Dr. Dione Plover. Aluisio, MD at [facility] on 10/23/2016. The patient is a 66 year old female who presented for follow up of their hip. The patient is being followed for their bilateral right hip OA and left THA 09/29/2013. The left hip is doing well. The right hip symptoms reported include: pain, pain after sitting, pain with weightbearing, difficulty ambulating and difficulty arising from chair. The patient feels that they are doing poorly and report their pain level to be moderate to severe. The following medication has been used for pain control: antiinflammatory medication (Celebrex). The patient indicates that they have questions or concerns regarding their progress at this point. She has had significant problems with that right hip now. Her left hip is doing fantastic. She is very pleased with the hip replacement. Right hip is very painful. She has pain throughout the hip going down to her knee. It is hurting at all times. It is something which she can and cannot do. It is actually worse than the left hip was prior to when she had that replaced. She would like to get the right hip replaced at this time. They have been treated conservatively in the past for the above stated problem and despite conservative measures, they continue to have progressive pain and severe functional limitations and dysfunction. They have failed non-operative management. It is felt that they would benefit from undergoing total joint replacement. Risks and benefits of the procedure have been discussed with the patient and they elect to proceed with surgery. There are no active  contraindications to surgery such as ongoing infection or rapidly progressive neurological disease.  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  She can use paper tape.  Family History Kidney disease  Father, Mother, Paternal Jon Gills. Osteoarthritis  Brother, Father, Maternal Grandfather, Maternal Grandmother, Paternal Grandmother. Heart Disease  Father, Paternal Grandmother. Rheumatoid Arthritis  Mother. Cerebrovascular Accident  Maternal Grandmother, Mother. Congestive Heart Failure  Father, Mother, Paternal Grandmother. Other medical problems  Mother:Parkinson's disease Hypertension  Brother, Father, Maternal Grandmother, Mother, Paternal Grandmother. Father  Deceased. age 38 Mother  Deceased. age 88 - Parkinson's  Social History  Current work status  retired Therapist, occupational. Living situation  live alone Tobacco use  Never smoker. 04/11/2013 Not under pain contract  Current drinker  04/11/2013:  Currently drinks beer, wine and hard liquor only occasionally per week Children  2 Marital status  divorced Tobacco / smoke exposure  04/11/2013: no Number of flights of stairs before winded  1 Most recent primary occupation  Retired Pharmacist, hospital No history of drug/alcohol rehab  Post-Surgical Plans  Patient wants to look into [place]. Alcohol use  Occasional alcohol use, Drinks wine. 1-2 glasses  Medication History ProAir HFA (108 (90 Base)MCG/ACT Aerosol Soln, Inhalation) Active. Turmeric (500MG  Tablet, Oral) Active. Celecoxib (200MG  Capsule, Oral) Active. Omega 3 (1000MG  Capsule, Oral) Active. Magnesium (200MG  Tablet, Oral) Active. Glucosamine Chondroitin Complx (Oral) Active. Aspirin EC (81MG  Tablet DR, Oral) Active. Loratadine (Oral) Specific strength unknown - Active. Cetirizine HCl (10MG  Tablet, Oral) Active. Vitamin D3 (Oral) Specific strength unknown - Active. Lisinopril (20MG  Tablet, Oral) Active. Atorvastatin Calcium (20MG  Tablet, Oral two times daily) Active.   Past Surgical History Dilation and Curettage of Uterus  Tonsillectomy    Review of Systems General Not Present- Chills, Fatigue, Fever, Memory Loss, Night Sweats, Weight Gain and Weight Loss. Skin Not Present- Eczema, Hives, Itching, Lesions and Rash. HEENT Not Present- Dentures, Double Vision, Headache, Hearing Loss, Tinnitus and Visual Loss. Respiratory Present- Allergies. Not Present- Chronic Cough, Coughing up blood, Shortness of breath at rest and Shortness of breath with exertion. Cardiovascular Not Present- Chest Pain, Difficulty Breathing Lying Down, Murmur, Palpitations, Racing/skipping heartbeats and Swelling. Gastrointestinal Not Present- Abdominal Pain, Bloody Stool, Constipation, Diarrhea, Difficulty Swallowing, Heartburn, Jaundice, Loss of appetitie, Nausea and Vomiting. Female Genitourinary Not Present- Blood in Urine, Discharge, Flank Pain, Incontinence, Painful Urination,  Urgency, Urinary frequency, Urinary Retention, Urinating at Night and Weak urinary stream. Musculoskeletal Not Present- Back Pain, Joint Pain, Joint Swelling, Morning Stiffness, Muscle Pain, Muscle Weakness and Spasms. Neurological Not Present- Blackout spells, Difficulty with balance, Dizziness, Paralysis, Tremor and Weakness. Psychiatric Not Present- Insomnia.  Vitals  Weight: 230 lb Height: 66in Body Surface Area: 2.12 m Body Mass Index: 37.12 kg/m  Pulse: 68 (Regular)  Resp.: 14 (Unlabored)  BP: 122/78 (Sitting, Right Arm, Standard)    Physical Exam General Mental Status -Alert, good historian and Appropriate affect. General Appearance-behaving appropriately, Not in acute distress. Orientation-Oriented X3. Build & Nutrition-Overweight, Well nourished and Well developed.  Head and Neck Head-normocephalic, atraumatic . Neck Global Assessment - supple, no bruit auscultated on the right, no bruit auscultated on the left.  Eye Pupil - Bilateral-Regular and Round. Motion - Bilateral-EOMI.  Chest and Lung Exam Auscultation Breath sounds - clear at anterior chest wall and clear at posterior chest wall. Adventitious sounds - No Adventitious sounds.  Cardiovascular Auscultation Rhythm - Regular rate and rhythm. Heart Sounds - S1 WNL and S2 WNL. Murmurs & Other Heart Sounds - Auscultation of the heart reveals - No Murmurs.  Abdomen Inspection Contour - Generalized mild distention. Palpation/Percussion Tenderness - Abdomen is non-tender to palpation. Rigidity (guarding) - Abdomen is soft. Auscultation Auscultation of the abdomen reveals - Bowel sounds normal.  Female Genitourinary Note: Not done, not pertinent to present illness   Musculoskeletal Note: On exam, she is in no distress. Her right hip can be flexed to 100. No internal rotation, about 10 to 20 of external rotation, 20 of abduction. She has a significantly antalgic gait pattern. Her  right knee shows no effusion. There is moderate crepitus on range of motion in the knee. There is slight tenderness medial greater than lateral with no instability.  RADIOGRAPHS AP pelvis and AP and lateral of the hips show the prosthesis on the left in  excellent position with no periprosthetic abnormalities. On the right, she has got severe bone-on-bone arthritis of that hip with subchondral cystic formation and slight erosion of the femoral head. Her knee shows some mild-to-moderate changes especially in the patellofemoral joint.  Assessment & Plan  Primary osteoarthritis of right hip (M16.11)  Note:Surgical Plans: Right Total Hip Replacement - Anterior Approach  Disposition: Lives Alone - Wants to look into [place]  PCP: Dr. Cheron Schaumann - Patient has been seen preoperatively and felt to be stable for surgery. Previous outpatinet labs taken on 10/01/2016 HGB - 12.8 HCT - 37.9 Hgb A1C - 6.1 TSH - 2.11 Cards: Dr. Ellyn Hack - Patient has been seen preoperatively and felt to be stable for surgery. "She'll be a low risk patinet for low risk surgery. Okay to stop aspirin. No need for additional cardiac evaluation."  IV TXA  Patient was instructed on what medications to stop prior to surgery.  Signed electronically by Joelene Millin, III PA-C

## 2016-10-23 ENCOUNTER — Inpatient Hospital Stay (HOSPITAL_COMMUNITY): Payer: Medicare HMO | Admitting: Certified Registered Nurse Anesthetist

## 2016-10-23 ENCOUNTER — Inpatient Hospital Stay (HOSPITAL_COMMUNITY): Payer: Medicare HMO

## 2016-10-23 ENCOUNTER — Encounter (HOSPITAL_COMMUNITY)
Admission: RE | Disposition: A | Discharge status: Skilled Nursing Facility | Payer: Self-pay | Source: Ambulatory Visit | Attending: Orthopedic Surgery

## 2016-10-23 ENCOUNTER — Encounter (HOSPITAL_COMMUNITY): Payer: Self-pay | Admitting: *Deleted

## 2016-10-23 ENCOUNTER — Inpatient Hospital Stay (HOSPITAL_COMMUNITY)
Admission: RE | Admit: 2016-10-23 | Discharge: 2016-10-25 | DRG: 470 | Disposition: A | Discharge status: Skilled Nursing Facility | Payer: Medicare HMO | Source: Ambulatory Visit | Attending: Orthopedic Surgery | Admitting: Orthopedic Surgery

## 2016-10-23 DIAGNOSIS — Z96642 Presence of left artificial hip joint: Secondary | ICD-10-CM | POA: Diagnosis not present

## 2016-10-23 DIAGNOSIS — Z9889 Other specified postprocedural states: Secondary | ICD-10-CM | POA: Diagnosis not present

## 2016-10-23 DIAGNOSIS — I10 Essential (primary) hypertension: Secondary | ICD-10-CM | POA: Diagnosis present

## 2016-10-23 DIAGNOSIS — Z8261 Family history of arthritis: Secondary | ICD-10-CM

## 2016-10-23 DIAGNOSIS — E785 Hyperlipidemia, unspecified: Secondary | ICD-10-CM | POA: Diagnosis not present

## 2016-10-23 DIAGNOSIS — M1990 Unspecified osteoarthritis, unspecified site: Secondary | ICD-10-CM | POA: Diagnosis not present

## 2016-10-23 DIAGNOSIS — E663 Overweight: Secondary | ICD-10-CM | POA: Diagnosis present

## 2016-10-23 DIAGNOSIS — E669 Obesity, unspecified: Secondary | ICD-10-CM | POA: Diagnosis not present

## 2016-10-23 DIAGNOSIS — Z85828 Personal history of other malignant neoplasm of skin: Secondary | ICD-10-CM | POA: Diagnosis not present

## 2016-10-23 DIAGNOSIS — M1611 Unilateral primary osteoarthritis, right hip: Principal | ICD-10-CM | POA: Diagnosis present

## 2016-10-23 DIAGNOSIS — Z471 Aftercare following joint replacement surgery: Secondary | ICD-10-CM | POA: Diagnosis not present

## 2016-10-23 DIAGNOSIS — Z8249 Family history of ischemic heart disease and other diseases of the circulatory system: Secondary | ICD-10-CM

## 2016-10-23 DIAGNOSIS — R69 Illness, unspecified: Secondary | ICD-10-CM | POA: Diagnosis not present

## 2016-10-23 DIAGNOSIS — Z96649 Presence of unspecified artificial hip joint: Secondary | ICD-10-CM

## 2016-10-23 DIAGNOSIS — Z96641 Presence of right artificial hip joint: Secondary | ICD-10-CM | POA: Diagnosis not present

## 2016-10-23 DIAGNOSIS — G8929 Other chronic pain: Secondary | ICD-10-CM | POA: Diagnosis not present

## 2016-10-23 DIAGNOSIS — Z6839 Body mass index (BMI) 39.0-39.9, adult: Secondary | ICD-10-CM

## 2016-10-23 DIAGNOSIS — M25572 Pain in left ankle and joints of left foot: Secondary | ICD-10-CM | POA: Diagnosis present

## 2016-10-23 DIAGNOSIS — Z7982 Long term (current) use of aspirin: Secondary | ICD-10-CM

## 2016-10-23 DIAGNOSIS — Z91048 Other nonmedicinal substance allergy status: Secondary | ICD-10-CM | POA: Diagnosis not present

## 2016-10-23 DIAGNOSIS — E782 Mixed hyperlipidemia: Secondary | ICD-10-CM | POA: Diagnosis not present

## 2016-10-23 DIAGNOSIS — M169 Osteoarthritis of hip, unspecified: Secondary | ICD-10-CM | POA: Diagnosis present

## 2016-10-23 DIAGNOSIS — E213 Hyperparathyroidism, unspecified: Secondary | ICD-10-CM | POA: Diagnosis not present

## 2016-10-23 DIAGNOSIS — D62 Acute posthemorrhagic anemia: Secondary | ICD-10-CM | POA: Diagnosis not present

## 2016-10-23 DIAGNOSIS — E559 Vitamin D deficiency, unspecified: Secondary | ICD-10-CM | POA: Diagnosis not present

## 2016-10-23 DIAGNOSIS — M8580 Other specified disorders of bone density and structure, unspecified site: Secondary | ICD-10-CM | POA: Diagnosis not present

## 2016-10-23 HISTORY — PX: TOTAL HIP ARTHROPLASTY: SHX124

## 2016-10-23 LAB — TYPE AND SCREEN
ABO/RH(D): A POS
Antibody Screen: NEGATIVE

## 2016-10-23 SURGERY — ARTHROPLASTY, HIP, TOTAL, ANTERIOR APPROACH
Anesthesia: Spinal | Site: Hip | Laterality: Right

## 2016-10-23 MED ORDER — FENTANYL CITRATE (PF) 100 MCG/2ML IJ SOLN
INTRAMUSCULAR | Status: DC | PRN
  Administered 2016-10-23 (×2): 50 ug via INTRAVENOUS

## 2016-10-23 MED ORDER — LORATADINE 10 MG PO TABS
10.0000 mg | ORAL_TABLET | Freq: Every day | ORAL | Status: DC
  Administered 2016-10-24 – 2016-10-25 (×2): 10 mg via ORAL
  Filled 2016-10-23 (×2): qty 1

## 2016-10-23 MED ORDER — BUPIVACAINE HCL (PF) 0.5 % IJ SOLN
INTRAMUSCULAR | Status: AC
  Filled 2016-10-23: qty 30

## 2016-10-23 MED ORDER — DIPHENHYDRAMINE HCL 12.5 MG/5ML PO ELIX: 12.5000 mg | ORAL_SOLUTION | ORAL | Status: DC | PRN

## 2016-10-23 MED ORDER — ACEBUTOLOL HCL 200 MG PO CAPS
200.0000 mg | ORAL_CAPSULE | Freq: Two times a day (BID) | ORAL | Status: DC
  Administered 2016-10-23 – 2016-10-25 (×4): 200 mg via ORAL
  Filled 2016-10-23 (×4): qty 1

## 2016-10-23 MED ORDER — BISACODYL 10 MG RE SUPP: 10.0000 mg | Freq: Every day | RECTAL | Status: DC | PRN

## 2016-10-23 MED ORDER — CEFAZOLIN SODIUM-DEXTROSE 2-4 GM/100ML-% IV SOLN
2.0000 g | Freq: Four times a day (QID) | INTRAVENOUS | Status: AC
  Administered 2016-10-23 – 2016-10-24 (×2): 2 g via INTRAVENOUS
  Filled 2016-10-23 (×2): qty 100

## 2016-10-23 MED ORDER — EPHEDRINE 5 MG/ML INJ
INTRAVENOUS | Status: AC
  Filled 2016-10-23: qty 10

## 2016-10-23 MED ORDER — MEPERIDINE HCL 50 MG/ML IJ SOLN: 6.2500 mg | INTRAMUSCULAR | Status: DC | PRN

## 2016-10-23 MED ORDER — ACETAMINOPHEN 325 MG PO TABS: 650.0000 mg | ORAL_TABLET | Freq: Four times a day (QID) | ORAL | Status: DC | PRN

## 2016-10-23 MED ORDER — SODIUM CHLORIDE 0.9 % IV SOLN
INTRAVENOUS | Status: DC
  Administered 2016-10-23: 22:00:00 via INTRAVENOUS

## 2016-10-23 MED ORDER — ACETAMINOPHEN 10 MG/ML IV SOLN
INTRAVENOUS | Status: AC
  Filled 2016-10-23: qty 100

## 2016-10-23 MED ORDER — PROPOFOL 10 MG/ML IV BOLUS
INTRAVENOUS | Status: AC
Start: 2016-10-23 — End: 2016-10-23
  Filled 2016-10-23: qty 20

## 2016-10-23 MED ORDER — TRANEXAMIC ACID 1000 MG/10ML IV SOLN
1000.0000 mg | Freq: Once | INTRAVENOUS | Status: AC
  Administered 2016-10-23: 1000 mg via INTRAVENOUS
  Filled 2016-10-23: qty 1100

## 2016-10-23 MED ORDER — ONDANSETRON HCL 4 MG/2ML IJ SOLN
INTRAMUSCULAR | Status: DC | PRN
  Administered 2016-10-23: 4 mg via INTRAVENOUS

## 2016-10-23 MED ORDER — DOCUSATE SODIUM 100 MG PO CAPS
100.0000 mg | ORAL_CAPSULE | Freq: Two times a day (BID) | ORAL | Status: DC
  Administered 2016-10-23 – 2016-10-25 (×4): 100 mg via ORAL
  Filled 2016-10-23 (×4): qty 1

## 2016-10-23 MED ORDER — ONDANSETRON HCL 4 MG/2ML IJ SOLN: 4.0000 mg | Freq: Four times a day (QID) | INTRAMUSCULAR | Status: DC | PRN

## 2016-10-23 MED ORDER — SODIUM CHLORIDE 0.9 % IR SOLN
Status: DC | PRN
  Administered 2016-10-23: 1000 mL

## 2016-10-23 MED ORDER — CHLORHEXIDINE GLUCONATE 4 % EX LIQD: 60.0000 mL | Freq: Once | CUTANEOUS | Status: DC

## 2016-10-23 MED ORDER — FLEET ENEMA 7-19 GM/118ML RE ENEM: 1.0000 | ENEMA | Freq: Once | RECTAL | Status: DC | PRN

## 2016-10-23 MED ORDER — FENTANYL CITRATE (PF) 100 MCG/2ML IJ SOLN
INTRAMUSCULAR | Status: AC
  Filled 2016-10-23: qty 2

## 2016-10-23 MED ORDER — MENTHOL 3 MG MT LOZG: 1.0000 | LOZENGE | OROMUCOSAL | Status: DC | PRN

## 2016-10-23 MED ORDER — MIDAZOLAM HCL 5 MG/5ML IJ SOLN
INTRAMUSCULAR | Status: DC | PRN
  Administered 2016-10-23 (×2): 1 mg via INTRAVENOUS

## 2016-10-23 MED ORDER — CEFAZOLIN SODIUM-DEXTROSE 2-4 GM/100ML-% IV SOLN
INTRAVENOUS | Status: AC
  Filled 2016-10-23: qty 100

## 2016-10-23 MED ORDER — MORPHINE SULFATE (PF) 2 MG/ML IV SOLN: 1.0000 mg | INTRAVENOUS | Status: DC | PRN

## 2016-10-23 MED ORDER — DEXAMETHASONE SODIUM PHOSPHATE 10 MG/ML IJ SOLN
INTRAMUSCULAR | Status: AC
  Filled 2016-10-23: qty 1

## 2016-10-23 MED ORDER — PHENOL 1.4 % MT LIQD: 1.0000 | OROMUCOSAL | Status: DC | PRN

## 2016-10-23 MED ORDER — METOCLOPRAMIDE HCL 5 MG/ML IJ SOLN: 5.0000 mg | Freq: Three times a day (TID) | INTRAMUSCULAR | Status: DC | PRN

## 2016-10-23 MED ORDER — ACETAMINOPHEN 10 MG/ML IV SOLN
1000.0000 mg | Freq: Once | INTRAVENOUS | Status: AC
  Administered 2016-10-23: 1000 mg via INTRAVENOUS

## 2016-10-23 MED ORDER — ACETAMINOPHEN 650 MG RE SUPP: 650.0000 mg | Freq: Four times a day (QID) | RECTAL | Status: DC | PRN

## 2016-10-23 MED ORDER — MIDAZOLAM HCL 2 MG/2ML IJ SOLN
INTRAMUSCULAR | Status: AC
  Filled 2016-10-23: qty 2

## 2016-10-23 MED ORDER — DEXAMETHASONE SODIUM PHOSPHATE 10 MG/ML IJ SOLN
10.0000 mg | Freq: Once | INTRAMUSCULAR | Status: AC
  Administered 2016-10-23: 10 mg via INTRAVENOUS

## 2016-10-23 MED ORDER — DEXAMETHASONE SODIUM PHOSPHATE 10 MG/ML IJ SOLN: INTRAMUSCULAR | Status: DC | PRN

## 2016-10-23 MED ORDER — BUPIVACAINE HCL (PF) 0.25 % IJ SOLN
INTRAMUSCULAR | Status: DC | PRN
  Administered 2016-10-23: 30 mL

## 2016-10-23 MED ORDER — LACTATED RINGERS IV SOLN: INTRAVENOUS | Status: DC

## 2016-10-23 MED ORDER — OXYCODONE HCL 5 MG PO TABS
5.0000 mg | ORAL_TABLET | ORAL | Status: DC | PRN
  Administered 2016-10-23: 10 mg via ORAL
  Filled 2016-10-23: qty 2

## 2016-10-23 MED ORDER — FENTANYL CITRATE (PF) 100 MCG/2ML IJ SOLN: 25.0000 ug | INTRAMUSCULAR | Status: DC | PRN

## 2016-10-23 MED ORDER — LACTATED RINGERS IV SOLN
INTRAVENOUS | Status: DC
  Administered 2016-10-23 (×3): via INTRAVENOUS

## 2016-10-23 MED ORDER — EPHEDRINE SULFATE-NACL 50-0.9 MG/10ML-% IV SOSY
PREFILLED_SYRINGE | INTRAVENOUS | Status: DC | PRN
  Administered 2016-10-23 (×2): 10 mg via INTRAVENOUS
  Administered 2016-10-23: 15 mg via INTRAVENOUS

## 2016-10-23 MED ORDER — DEXAMETHASONE SODIUM PHOSPHATE 10 MG/ML IJ SOLN
10.0000 mg | Freq: Once | INTRAMUSCULAR | Status: AC
  Administered 2016-10-24: 10 mg via INTRAVENOUS
  Filled 2016-10-23: qty 1

## 2016-10-23 MED ORDER — METHOCARBAMOL 1000 MG/10ML IJ SOLN
500.0000 mg | Freq: Four times a day (QID) | INTRAVENOUS | Status: DC | PRN
  Administered 2016-10-23 – 2016-10-24 (×2): 500 mg via INTRAVENOUS
  Filled 2016-10-23 (×2): qty 550

## 2016-10-23 MED ORDER — ACETAMINOPHEN 500 MG PO TABS
1000.0000 mg | ORAL_TABLET | Freq: Four times a day (QID) | ORAL | Status: AC
  Filled 2016-10-23: qty 2

## 2016-10-23 MED ORDER — PROPOFOL 10 MG/ML IV BOLUS
INTRAVENOUS | Status: AC
  Filled 2016-10-23: qty 40

## 2016-10-23 MED ORDER — LIDOCAINE 2% (20 MG/ML) 5 ML SYRINGE
INTRAMUSCULAR | Status: AC
  Filled 2016-10-23: qty 5

## 2016-10-23 MED ORDER — TRAMADOL HCL 50 MG PO TABS
50.0000 mg | ORAL_TABLET | Freq: Four times a day (QID) | ORAL | Status: DC | PRN
  Administered 2016-10-23 – 2016-10-25 (×5): 100 mg via ORAL
  Filled 2016-10-23 (×7): qty 2

## 2016-10-23 MED ORDER — PROPOFOL 10 MG/ML IV BOLUS
INTRAVENOUS | Status: DC | PRN
  Administered 2016-10-23: 20 mg via INTRAVENOUS
  Administered 2016-10-23 (×2): 10 mg via INTRAVENOUS
  Administered 2016-10-23: 20 mg via INTRAVENOUS
  Administered 2016-10-23 (×4): 10 mg via INTRAVENOUS
  Administered 2016-10-23: 20 mg via INTRAVENOUS
  Administered 2016-10-23: 10 mg via INTRAVENOUS

## 2016-10-23 MED ORDER — METHOCARBAMOL 500 MG PO TABS
500.0000 mg | ORAL_TABLET | Freq: Four times a day (QID) | ORAL | Status: DC | PRN
  Administered 2016-10-24 (×2): 500 mg via ORAL
  Filled 2016-10-23 (×2): qty 1

## 2016-10-23 MED ORDER — PROPOFOL 500 MG/50ML IV EMUL
INTRAVENOUS | Status: DC | PRN
  Administered 2016-10-23: 50 ug/kg/min via INTRAVENOUS

## 2016-10-23 MED ORDER — BUPIVACAINE HCL (PF) 0.5 % IJ SOLN
INTRAMUSCULAR | Status: DC | PRN
  Administered 2016-10-23: 3 mL

## 2016-10-23 MED ORDER — ATORVASTATIN CALCIUM 20 MG PO TABS: 20.0000 mg | ORAL_TABLET | Freq: Every day | ORAL | Status: DC

## 2016-10-23 MED ORDER — ONDANSETRON HCL 4 MG PO TABS: 4.0000 mg | ORAL_TABLET | Freq: Four times a day (QID) | ORAL | Status: DC | PRN

## 2016-10-23 MED ORDER — TRANEXAMIC ACID 1000 MG/10ML IV SOLN
1000.0000 mg | INTRAVENOUS | Status: AC
  Administered 2016-10-23: 1000 mg via INTRAVENOUS
  Filled 2016-10-23: qty 1100

## 2016-10-23 MED ORDER — BUPIVACAINE HCL (PF) 0.25 % IJ SOLN
INTRAMUSCULAR | Status: AC
  Filled 2016-10-23: qty 30

## 2016-10-23 MED ORDER — METOCLOPRAMIDE HCL 5 MG PO TABS: 5.0000 mg | ORAL_TABLET | Freq: Three times a day (TID) | ORAL | Status: DC | PRN

## 2016-10-23 MED ORDER — POLYETHYLENE GLYCOL 3350 17 G PO PACK: 17.0000 g | PACK | Freq: Every day | ORAL | Status: DC | PRN

## 2016-10-23 MED ORDER — METOCLOPRAMIDE HCL 5 MG/ML IJ SOLN: 10.0000 mg | Freq: Once | INTRAMUSCULAR | Status: DC | PRN

## 2016-10-23 MED ORDER — RIVAROXABAN 10 MG PO TABS
10.0000 mg | ORAL_TABLET | Freq: Every day | ORAL | Status: DC
  Administered 2016-10-24 – 2016-10-25 (×2): 10 mg via ORAL
  Filled 2016-10-23 (×2): qty 1

## 2016-10-23 MED ORDER — STERILE WATER FOR IRRIGATION IR SOLN
Status: DC | PRN
  Administered 2016-10-23: 1000 mL

## 2016-10-23 MED ORDER — CEFAZOLIN SODIUM-DEXTROSE 2-4 GM/100ML-% IV SOLN
2.0000 g | INTRAVENOUS | Status: AC
  Administered 2016-10-23: 2 g via INTRAVENOUS

## 2016-10-23 MED ORDER — KETOROLAC TROMETHAMINE 30 MG/ML IJ SOLN
INTRAMUSCULAR | Status: AC
  Filled 2016-10-23: qty 1

## 2016-10-23 SURGICAL SUPPLY — 34 items
BAG DECANTER FOR FLEXI CONT (MISCELLANEOUS) ×2 IMPLANT
BAG ZIPLOCK 12X15 (MISCELLANEOUS) IMPLANT
BLADE SAG 18X100X1.27 (BLADE) ×2 IMPLANT
CAPT HIP TOTAL 2 ×2 IMPLANT
CLOTH BEACON ORANGE TIMEOUT ST (SAFETY) ×2 IMPLANT
COVER PERINEAL POST (MISCELLANEOUS) ×2 IMPLANT
COVER SURGICAL LIGHT HANDLE (MISCELLANEOUS) ×2 IMPLANT
DECANTER SPIKE VIAL GLASS SM (MISCELLANEOUS) ×2 IMPLANT
DRAPE STERI IOBAN 125X83 (DRAPES) ×2 IMPLANT
DRAPE U-SHAPE 47X51 STRL (DRAPES) ×4 IMPLANT
DRSG ADAPTIC 3X8 NADH LF (GAUZE/BANDAGES/DRESSINGS) ×2 IMPLANT
DRSG MEPILEX BORDER 4X4 (GAUZE/BANDAGES/DRESSINGS) ×2 IMPLANT
DRSG MEPILEX BORDER 4X8 (GAUZE/BANDAGES/DRESSINGS) ×2 IMPLANT
DURAPREP 26ML APPLICATOR (WOUND CARE) ×2 IMPLANT
ELECT REM PT RETURN 15FT ADLT (MISCELLANEOUS) ×2 IMPLANT
EVACUATOR 1/8 PVC DRAIN (DRAIN) ×2 IMPLANT
GLOVE BIO SURGEON STRL SZ7.5 (GLOVE) ×2 IMPLANT
GLOVE BIO SURGEON STRL SZ8 (GLOVE) ×4 IMPLANT
GLOVE BIOGEL PI IND STRL 8 (GLOVE) ×2 IMPLANT
GLOVE BIOGEL PI INDICATOR 8 (GLOVE) ×2
GOWN STRL REUS W/TWL LRG LVL3 (GOWN DISPOSABLE) ×2 IMPLANT
GOWN STRL REUS W/TWL XL LVL3 (GOWN DISPOSABLE) ×2 IMPLANT
PACK ANTERIOR HIP CUSTOM (KITS) ×2 IMPLANT
STRIP CLOSURE SKIN 1/2X4 (GAUZE/BANDAGES/DRESSINGS) ×2 IMPLANT
SUT ETHIBOND NAB CT1 #1 30IN (SUTURE) ×2 IMPLANT
SUT MNCRL AB 4-0 PS2 18 (SUTURE) ×2 IMPLANT
SUT STRATAFIX 0 PDS 27 VIOLET (SUTURE) ×2
SUT VIC AB 2-0 CT1 27 (SUTURE) ×2
SUT VIC AB 2-0 CT1 TAPERPNT 27 (SUTURE) ×2 IMPLANT
SUTURE STRATFX 0 PDS 27 VIOLET (SUTURE) ×1 IMPLANT
SYR 50ML LL SCALE MARK (SYRINGE) IMPLANT
TAPE STRIPS DRAPE STRL (GAUZE/BANDAGES/DRESSINGS) ×2 IMPLANT
TRAY FOLEY W/METER SILVER 16FR (SET/KITS/TRAYS/PACK) ×2 IMPLANT
YANKAUER SUCT BULB TIP 10FT TU (MISCELLANEOUS) ×2 IMPLANT

## 2016-10-23 NOTE — Anesthesia Postprocedure Evaluation (Signed)
Anesthesia Post Note  Patient: Alicia Schroeder  Procedure(s) Performed: Procedure(s) (LRB): RIGHT TOTAL HIP ARTHROPLASTY ANTERIOR APPROACH (Right)     Patient location during evaluation: PACU Anesthesia Type: Spinal Level of consciousness: awake and alert Pain management: pain level controlled Vital Signs Assessment: post-procedure vital signs reviewed and stable Respiratory status: spontaneous breathing and respiratory function stable Cardiovascular status: blood pressure returned to baseline and stable Postop Assessment: no headache, no backache and spinal receding Anesthetic complications: no    Last Vitals:  Vitals:   10/23/16 1815 10/23/16 1930  BP: (!) 175/76 (!) 162/81  Pulse: (!) 59 74  Resp: 14 14  Temp: 36.6 C 36.7 C    Last Pain:  Vitals:   10/23/16 1930  TempSrc: Oral  PainSc:                  [name]

## 2016-10-23 NOTE — Interval H&P Note (Signed)
History and Physical Interval Note:  10/23/2016 2:36 PM  Alicia Schroeder  has presented today for surgery, with the diagnosis of Osteoarthritis Right Hip  The various methods of treatment have been discussed with the patient and family. After consideration of risks, benefits and other options for treatment, the patient has consented to  Procedure(s): RIGHT TOTAL HIP ARTHROPLASTY ANTERIOR APPROACH (Right) as a surgical intervention .  The patient's history has been reviewed, patient examined, no change in status, stable for surgery.  I have reviewed the patient's chart and labs.  Questions were answered to the patient's satisfaction.     Gearlean Alf

## 2016-10-23 NOTE — Anesthesia Preprocedure Evaluation (Signed)
Anesthesia Evaluation  Patient identified by MRN, date of birth, ID band Patient awake    Reviewed: Allergy & Precautions, H&P , NPO status , Patient's Chart, lab work & pertinent test results  Airway Mallampati: II  TM Distance: >3 FB Neck ROM: Full    Dental no notable dental hx.    Pulmonary neg pulmonary ROS,    Pulmonary exam normal breath sounds clear to auscultation       Cardiovascular hypertension, Pt. on medications Normal cardiovascular exam Rhythm:Regular Rate:Normal     Neuro/Psych negative neurological ROS  negative psych ROS   GI/Hepatic negative GI ROS, Neg liver ROS,   Endo/Other  negative endocrine ROS  Renal/GU negative Renal ROS  negative genitourinary   Musculoskeletal negative musculoskeletal ROS (+)   Abdominal   Peds negative pediatric ROS (+)  Hematology negative hematology ROS (+)   Anesthesia Other Findings   Reproductive/Obstetrics negative OB ROS                             Anesthesia Physical  Anesthesia Plan  ASA: II  Anesthesia Plan: Spinal   Post-op Pain Management:    Induction: Intravenous  PONV Risk Score and Plan: 2 and Ondansetron, Dexamethasone and Treatment may vary due to age  Airway Management Planned: Simple Face Mask  Additional Equipment:   Intra-op Plan:   Post-operative Plan:   Informed Consent: I have reviewed the patients History and Physical, chart, labs and discussed the procedure including the risks, benefits and alternatives for the proposed anesthesia with the patient or authorized representative who has indicated his/her understanding and acceptance.   Dental advisory given  Plan Discussed with: CRNA and Surgeon  Anesthesia Plan Comments:         Anesthesia Quick Evaluation

## 2016-10-23 NOTE — H&P (View-Only) (Signed)
Alicia Schroeder DOB: 08/17/1950 Divorced / Language: English / Race: White Female Date of Admission:  10/23/16 CC:  Right hip pain History of Present Illness The patient is a 66 year old female who comes in for a preoperative History and Physical. The patient is scheduled for a right total hip arthroplasty (anterior) to be performed by Dr. Dione Plover. Aluisio, MD at [facility] on 10/23/2016. The patient is a 66 year old female who presented for follow up of their hip. The patient is being followed for their bilateral right hip OA and left THA 09/29/2013. The left hip is doing well. The right hip symptoms reported include: pain, pain after sitting, pain with weightbearing, difficulty ambulating and difficulty arising from chair. The patient feels that they are doing poorly and report their pain level to be moderate to severe. The following medication has been used for pain control: antiinflammatory medication (Celebrex). The patient indicates that they have questions or concerns regarding their progress at this point. She has had significant problems with that right hip now. Her left hip is doing fantastic. She is very pleased with the hip replacement. Right hip is very painful. She has pain throughout the hip going down to her knee. It is hurting at all times. It is something which she can and cannot do. It is actually worse than the left hip was prior to when she had that replaced. She would like to get the right hip replaced at this time. They have been treated conservatively in the past for the above stated problem and despite conservative measures, they continue to have progressive pain and severe functional limitations and dysfunction. They have failed non-operative management. It is felt that they would benefit from undergoing total joint replacement. Risks and benefits of the procedure have been discussed with the patient and they elect to proceed with surgery. There are no active  contraindications to surgery such as ongoing infection or rapidly progressive neurological disease.  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  She can use paper tape.  Family History Kidney disease  Father, Mother, Paternal Jon Gills. Osteoarthritis  Brother, Father, Maternal Grandfather, Maternal Grandmother, Paternal Grandmother. Heart Disease  Father, Paternal Grandmother. Rheumatoid Arthritis  Mother. Cerebrovascular Accident  Maternal Grandmother, Mother. Congestive Heart Failure  Father, Mother, Paternal Grandmother. Other medical problems  Mother:Parkinson's disease Hypertension  Brother, Father, Maternal Grandmother, Mother, Paternal Grandmother. Father  Deceased. age 72 Mother  Deceased. age 51 - Parkinson's  Social History  Current work status  retired Therapist, occupational. Living situation  live alone Tobacco use  Never smoker. 04/11/2013 Not under pain contract  Current drinker  04/11/2013:  Currently drinks beer, wine and hard liquor only occasionally per week Children  2 Marital status  divorced Tobacco / smoke exposure  04/11/2013: no Number of flights of stairs before winded  1 Most recent primary occupation  Retired Pharmacist, hospital No history of drug/alcohol rehab  Post-Surgical Plans  Patient wants to look into [place]. Alcohol use  Occasional alcohol use, Drinks wine. 1-2 glasses  Medication History ProAir HFA (108 (90 Base)MCG/ACT Aerosol Soln, Inhalation) Active. Turmeric (500MG  Tablet, Oral) Active. Celecoxib (200MG  Capsule, Oral) Active. Omega 3 (1000MG  Capsule, Oral) Active. Magnesium (200MG  Tablet, Oral) Active. Glucosamine Chondroitin Complx (Oral) Active. Aspirin EC (81MG  Tablet DR, Oral) Active. Loratadine (Oral) Specific strength unknown - Active. Cetirizine HCl (10MG  Tablet, Oral) Active. Vitamin D3 (Oral) Specific strength unknown - Active. Lisinopril (20MG  Tablet, Oral) Active. Atorvastatin Calcium (20MG  Tablet, Oral two times daily) Active.   Past Surgical History Dilation and Curettage of Uterus  Tonsillectomy    Review of Systems General Not Present- Chills, Fatigue, Fever, Memory Loss, Night Sweats, Weight Gain and Weight Loss. Skin Not Present- Eczema, Hives, Itching, Lesions and Rash. HEENT Not Present- Dentures, Double Vision, Headache, Hearing Loss, Tinnitus and Visual Loss. Respiratory Present- Allergies. Not Present- Chronic Cough, Coughing up blood, Shortness of breath at rest and Shortness of breath with exertion. Cardiovascular Not Present- Chest Pain, Difficulty Breathing Lying Down, Murmur, Palpitations, Racing/skipping heartbeats and Swelling. Gastrointestinal Not Present- Abdominal Pain, Bloody Stool, Constipation, Diarrhea, Difficulty Swallowing, Heartburn, Jaundice, Loss of appetitie, Nausea and Vomiting. Female Genitourinary Not Present- Blood in Urine, Discharge, Flank Pain, Incontinence, Painful Urination,  Urgency, Urinary frequency, Urinary Retention, Urinating at Night and Weak urinary stream. Musculoskeletal Not Present- Back Pain, Joint Pain, Joint Swelling, Morning Stiffness, Muscle Pain, Muscle Weakness and Spasms. Neurological Not Present- Blackout spells, Difficulty with balance, Dizziness, Paralysis, Tremor and Weakness. Psychiatric Not Present- Insomnia.  Vitals  Weight: 230 lb Height: 66in Body Surface Area: 2.12 m Body Mass Index: 37.12 kg/m  Pulse: 68 (Regular)  Resp.: 14 (Unlabored)  BP: 122/78 (Sitting, Right Arm, Standard)    Physical Exam General Mental Status -Alert, good historian and Appropriate affect. General Appearance-behaving appropriately, Not in acute distress. Orientation-Oriented X3. Build & Nutrition-Overweight, Well nourished and Well developed.  Head and Neck Head-normocephalic, atraumatic . Neck Global Assessment - supple, no bruit auscultated on the right, no bruit auscultated on the left.  Eye Pupil - Bilateral-Regular and Round. Motion - Bilateral-EOMI.  Chest and Lung Exam Auscultation Breath sounds - clear at anterior chest wall and clear at posterior chest wall. Adventitious sounds - No Adventitious sounds.  Cardiovascular Auscultation Rhythm - Regular rate and rhythm. Heart Sounds - S1 WNL and S2 WNL. Murmurs & Other Heart Sounds - Auscultation of the heart reveals - No Murmurs.  Abdomen Inspection Contour - Generalized mild distention. Palpation/Percussion Tenderness - Abdomen is non-tender to palpation. Rigidity (guarding) - Abdomen is soft. Auscultation Auscultation of the abdomen reveals - Bowel sounds normal.  Female Genitourinary Note: Not done, not pertinent to present illness   Musculoskeletal Note: On exam, she is in no distress. Her right hip can be flexed to 100. No internal rotation, about 10 to 20 of external rotation, 20 of abduction. She has a significantly antalgic gait pattern. Her  right knee shows no effusion. There is moderate crepitus on range of motion in the knee. There is slight tenderness medial greater than lateral with no instability.  RADIOGRAPHS AP pelvis and AP and lateral of the hips show the prosthesis on the left in  excellent position with no periprosthetic abnormalities. On the right, she has got severe bone-on-bone arthritis of that hip with subchondral cystic formation and slight erosion of the femoral head. Her knee shows some mild-to-moderate changes especially in the patellofemoral joint.  Assessment & Plan  Primary osteoarthritis of right hip (M16.11)  Note:Surgical Plans: Right Total Hip Replacement - Anterior Approach  Disposition: Lives Alone - Wants to look into [place]  PCP: Dr. Cheron Schaumann - Patient has been seen preoperatively and felt to be stable for surgery. Previous outpatinet labs taken on 10/01/2016 HGB - 12.8 HCT - 37.9 Hgb A1C - 6.1 TSH - 2.11 Cards: Dr. Ellyn Hack - Patient has been seen preoperatively and felt to be stable for surgery. "She'll be a low risk patinet for low risk surgery. Okay to stop aspirin. No need for additional cardiac evaluation."  IV TXA  Patient was instructed on what medications to stop prior to surgery.  Signed electronically by Joelene Millin, III PA-C

## 2016-10-23 NOTE — Discharge Instructions (Addendum)
° °Dr. Frank Aluisio °Total Joint Specialist °[facility] °[address] °[place], [place] [address] °[phone] ° °ANTERIOR APPROACH TOTAL HIP REPLACEMENT POSTOPERATIVE DIRECTIONS ° ° °Hip Rehabilitation, Guidelines Following Surgery  °The results of a hip operation are greatly improved after range of motion and muscle strengthening exercises. Follow all safety measures which are given to protect your hip. If any of these exercises cause increased pain or swelling in your joint, decrease the amount until you are comfortable again. Then slowly increase the exercises. Call your caregiver if you have problems or questions.  ° °HOME CARE INSTRUCTIONS  °Remove items at home which could result in a fall. This includes throw rugs or furniture in walking pathways.  °· ICE to the affected hip every three hours for 30 minutes at a time and then as needed for pain and swelling.  Continue to use ice on the hip for pain and swelling from surgery. You may notice swelling that will progress down to the foot and ankle.  This is normal after surgery.  Elevate the leg when you are not up walking on it.   °· Continue to use the breathing machine which will help keep your temperature down.  It is common for your temperature to cycle up and down following surgery, especially at night when you are not up moving around and exerting yourself.  The breathing machine keeps your lungs expanded and your temperature down. ° ° °DIET °You may resume your previous home diet once your are discharged from the hospital. ° °DRESSING / WOUND CARE / SHOWERING °You may shower 3 days after surgery, but keep the wounds dry during showering.  You may use an occlusive plastic wrap (Press'n Seal for example), NO SOAKING/SUBMERGING IN THE BATHTUB.  If the bandage gets wet, change with a clean dry gauze.  If the incision gets wet, pat the wound dry with a clean towel. °You may start showering once you are discharged home but do not  submerge the incision under water. Just pat the incision dry and apply a dry gauze dressing on daily. °Change the surgical dressing daily and reapply a dry dressing each time. ° °ACTIVITY °Walk with your walker as instructed. °Use walker as long as suggested by your caregivers. °Avoid periods of inactivity such as sitting longer than an hour when not asleep. This helps prevent blood clots.  °You may resume a sexual relationship in one month or when given the OK by your doctor.  °You may return to work once you are cleared by your doctor.  °Do not drive a car for 6 weeks or until released by you surgeon.  °Do not drive while taking narcotics. ° °WEIGHT BEARING °Weight bearing as tolerated with assist device (walker, cane, etc) as directed, use it as long as suggested by your surgeon or therapist, typically at least 4-6 weeks. ° °POSTOPERATIVE CONSTIPATION PROTOCOL °Constipation - defined medically as fewer than three stools per week and severe constipation as less than one stool per week. ° °One of the most common issues patients have following surgery is constipation.  Even if you have a regular bowel pattern at home, your normal regimen is likely to be disrupted due to multiple reasons following surgery.  Combination of anesthesia, postoperative narcotics, change in appetite and fluid intake all can affect your bowels.  In order to avoid complications following surgery, here are some recommendations in order to help you during your recovery period. ° °Colace (docusate) - Pick up an over-the-counter   form of Colace or another stool softener and take twice a day as long as you are requiring postoperative pain medications.  Take with a full glass of water daily.  If you experience loose stools or diarrhea, hold the colace until you stool forms back up.  If your symptoms do not get better within 1 week or if they get worse, check with your doctor. ° °Dulcolax (bisacodyl) - Pick up over-the-counter and take as directed  by the product packaging as needed to assist with the movement of your bowels.  Take with a full glass of water.  Use this product as needed if not relieved by Colace only.  ° °MiraLax (polyethylene glycol) - Pick up over-the-counter to have on hand.  MiraLax is a solution that will increase the amount of water in your bowels to assist with bowel movements.  Take as directed and can mix with a glass of water, juice, soda, coffee, or tea.  Take if you go more than two days without a movement. °Do not use MiraLax more than once per day. Call your doctor if you are still constipated or irregular after using this medication for 7 days in a row. ° °If you continue to have problems with postoperative constipation, please contact the office for further assistance and recommendations.  If you experience "the worst abdominal pain ever" or develop nausea or vomiting, please contact the office immediatly for further recommendations for treatment. ° °ITCHING ° If you experience itching with your medications, try taking only a single pain pill, or even half a pain pill at a time.  You can also use Benadryl over the counter for itching or also to help with sleep.  ° °TED HOSE STOCKINGS °Wear the elastic stockings on both legs for three weeks following surgery during the day but you may remove then at night for sleeping. ° °MEDICATIONS °See your medication summary on the “After Visit Summary” that the nursing staff will review with you prior to discharge.  You may have some home medications which will be placed on hold until you complete the course of blood thinner medication.  It is important for you to complete the blood thinner medication as prescribed by your surgeon.  Continue your approved medications as instructed at time of discharge. ° °PRECAUTIONS °If you experience chest pain or shortness of breath - call 911 immediately for transfer to the hospital emergency department.  °If you develop a fever greater that 101 F,  purulent drainage from wound, increased redness or drainage from wound, foul odor from the wound/dressing, or calf pain - CONTACT YOUR SURGEON.   °                                                °FOLLOW-UP APPOINTMENTS °Make sure you keep all of your appointments after your operation with your surgeon and caregivers. You should call the office at the above phone number and make an appointment for approximately two weeks after the date of your surgery or on the date instructed by your surgeon outlined in the "After Visit Summary". ° °RANGE OF MOTION AND STRENGTHENING EXERCISES  °These exercises are designed to help you keep full movement of your hip joint. Follow your caregiver's or physical therapist's instructions. Perform all exercises about fifteen times, three times per day or as directed. Exercise both hips, even if you   have had only one joint replacement. These exercises can be done on a training (exercise) mat, on the floor, on a table or on a bed. Use whatever works the best and is most comfortable for you. Use music or television while you are exercising so that the exercises are a pleasant break in your day. This will make your life better with the exercises acting as a break in routine you can look forward to.  °Lying on your back, slowly slide your foot toward your buttocks, raising your knee up off the floor. Then slowly slide your foot back down until your leg is straight again.  °Lying on your back spread your legs as far apart as you can without causing discomfort.  °Lying on your side, raise your upper leg and foot straight up from the floor as far as is comfortable. Slowly lower the leg and repeat.  °Lying on your back, tighten up the muscle in the front of your thigh (quadriceps muscles). You can do this by keeping your leg straight and trying to raise your heel off the floor. This helps strengthen the largest muscle supporting your knee.  °Lying on your back, tighten up the muscles of your  buttocks both with the legs straight and with the knee bent at a comfortable angle while keeping your heel on the floor.  ° °IF YOU ARE TRANSFERRED TO A SKILLED REHAB FACILITY °If the patient is transferred to a skilled rehab facility following release from the hospital, a list of the current medications will be sent to the facility for the patient to continue.  When discharged from the skilled rehab facility, please have the facility set up the patient's Home Health Physical Therapy prior to being released. Also, the skilled facility will be responsible for providing the patient with their medications at time of release from the facility to include their pain medication, the muscle relaxants, and their blood thinner medication. If the patient is still at the rehab facility at time of the two week follow up appointment, the skilled rehab facility will also need to assist the patient in arranging follow up appointment in our office and any transportation needs. ° °MAKE SURE YOU:  °Understand these instructions.  °Get help right away if you are not doing well or get worse.  ° ° °Pick up stool softner and laxative for home use following surgery while on pain medications. °Do not submerge incision under water. °Please use good hand washing techniques while changing dressing each day. °May shower starting three days after surgery. °Please use a clean towel to pat the incision dry following showers. °Continue to use ice for pain and swelling after surgery. °Do not use any lotions or creams on the incision until instructed by your surgeon. ° °Take Xarelto for two and a half more weeks following discharge from the hospital, then discontinue Xarelto. °Once the patient has completed the Xarelto, they may resume the 81 mg Aspirin. ° ° ° ° ° °Information on my medicine - XARELTO® (Rivaroxaban) ° °Why was Xarelto® prescribed for you? °Xarelto® was prescribed for you to reduce the risk of blood clots forming after orthopedic  surgery. The medical term for these abnormal blood clots is venous thromboembolism (VTE). ° °What do you need to know about xarelto® ? °Take your Xarelto® ONCE DAILY at the same time every day. °You may take it either with or without food. ° °If you have difficulty swallowing the tablet whole, you may crush it and mix in applesauce   just prior to taking your dose. ° °Take Xarelto® exactly as prescribed by your doctor and DO NOT stop taking Xarelto® without talking to the doctor who prescribed the medication.  Stopping without other VTE prevention medication to take the place of Xarelto® may increase your risk of developing a clot. ° °After discharge, you should have regular check-up appointments with your healthcare provider that is prescribing your Xarelto®.   ° °What do you do if you miss a dose? °If you miss a dose, take it as soon as you remember on the same day then continue your regularly scheduled once daily regimen the next day. Do not take two doses of Xarelto® on the same day.  ° °Important Safety Information °A possible side effect of Xarelto® is bleeding. You should call your healthcare provider right away if you experience any of the following: °? Bleeding from an injury or your nose that does not stop. °? Unusual colored urine (red or dark brown) or unusual colored stools (red or black). °? Unusual bruising for unknown reasons. °? A serious fall or if you hit your head (even if there is no bleeding). ° °Some medicines may interact with Xarelto® and might increase your risk of bleeding while on Xarelto®. To help avoid this, consult your healthcare provider or pharmacist prior to using any new prescription or non-prescription medications, including herbals, vitamins, non-steroidal anti-inflammatory drugs (NSAIDs) and supplements. ° °This website has more information on Xarelto®: www.xarelto.com. ° ° ° °

## 2016-10-23 NOTE — Progress Notes (Signed)
10/23/16 2000 Nursing Matt  Babish PA called reg patient request to restart lisinopril tonight. No order received. Order to hold lisinopril due to possible effects on urinary output tonight. Will continue to monitor patient.

## 2016-10-23 NOTE — Op Note (Signed)
OPERATIVE REPORT- TOTAL HIP ARTHROPLASTY   PREOPERATIVE DIAGNOSIS: Osteoarthritis of the Right hip.   POSTOPERATIVE DIAGNOSIS: Osteoarthritis of the Right  hip.   PROCEDURE: Right total hip arthroplasty, anterior approach.   SURGEON: Gaynelle Arabian, MD   ASSISTANT: Arlee Muslim, PA-C  ANESTHESIA:  Spinal  ESTIMATED BLOOD LOSS:-350 ml    DRAINS: Hemovac x1.   COMPLICATIONS: None   CONDITION: PACU - hemodynamically stable.   BRIEF CLINICAL NOTE: Alicia Schroeder is a 66 y.o. female who has advanced end-  stage arthritis of their Right  hip with progressively worsening pain and  dysfunction.The patient has failed nonoperative management and presents for  total hip arthroplasty.   PROCEDURE IN DETAIL: After successful administration of spinal  anesthetic, the traction boots for the [facility] bed were placed on both  feet and the patient was placed onto the [facility] bed, boots placed into the leg  holders. The Right hip was then isolated from the perineum with plastic  drapes and prepped and draped in the usual sterile fashion. ASIS and  greater trochanter were marked and a oblique incision was made, starting  at about 1 cm lateral and 2 cm distal to the ASIS and coursing towards  the anterior cortex of the femur. The skin was cut with a 10 blade  through subcutaneous tissue to the level of the fascia overlying the  tensor fascia lata muscle. The fascia was then incised in line with the  incision at the junction of the anterior third and posterior 2/3rd. The  muscle was teased off the fascia and then the interval between the TFL  and the rectus was developed. The Hohmann retractor was then placed at  the top of the femoral neck over the capsule. The vessels overlying the  capsule were cauterized and the fat on top of the capsule was removed.  A Hohmann retractor was then placed anterior underneath the rectus  femoris to give exposure to the entire anterior capsule. A T-shaped   capsulotomy was performed. The edges were tagged and the femoral head  was identified.       Osteophytes are removed off the superior acetabulum.  The femoral neck was then cut in situ with an oscillating saw. Traction  was then applied to the left lower extremity utilizing the [facility]  traction. The femoral head was then removed. Retractors were placed  around the acetabulum and then circumferential removal of the labrum was  performed. Osteophytes were also removed. Reaming starts at 45 mm to  medialize and  Increased in 2 mm increments to 49 mm. We reamed in  approximately 40 degrees of abduction, 20 degrees anteversion. A 50 mm  pinnacle acetabular shell was then impacted in anatomic position under  fluoroscopic guidance with excellent purchase. We did not need to place  any additional dome screws. A 32 mm neutral + 4 marathon liner was then  placed into the acetabular shell.       The femoral lift was then placed along the lateral aspect of the femur  just distal to the vastus ridge. The leg was  externally rotated and capsule  was stripped off the inferior aspect of the femoral neck down to the  level of the lesser trochanter, this was done with electrocautery. The femur was lifted after this was performed. The  leg was then placed in an extended and adducted position essentially delivering the femur. We also removed the capsule superiorly and the piriformis from the piriformis fossa  to gain excellent exposure of the  proximal femur. Rongeur was used to remove some cancellous bone to get  into the lateral portion of the proximal femur for placement of the  initial starter reamer. The starter broaches was placed  the starter broach  and was shown to go down the center of the canal. Broaching  with the  Corail system was then performed starting at size 8, coursing  Up to size 12. A size 12 had excellent torsional and rotational  and axial stability. The trial standard offset neck was then  placed  with a 32 + 5 trial head. The hip was then reduced. We confirmed that  the stem was in the canal both on AP and lateral x-rays. It also has excellent sizing. The hip was reduced with outstanding stability through full extension and full external rotation.. AP pelvis was taken and the leg lengths were measured and found to be equal. Hip was then dislocated again and the femoral head and neck removed. The  femoral broach was removed. Size 12 Corail stem with a standard offset  neck was then impacted into the femur following native anteversion. Has  excellent purchase in the canal. Excellent torsional and rotational and  axial stability. It is confirmed to be in the canal on AP and lateral  fluoroscopic views. The 32 + 5 ceramic head was placed and the hip  reduced with outstanding stability. Again AP pelvis was taken and it  confirmed that the leg lengths were equal. The wound was then copiously  irrigated with saline solution and the capsule reattached and repaired  with Ethibond suture. 30 ml of .25% Bupivicaine was  injected into the capsule and into the edge of the tensor fascia lata as well as subcutaneous tissue. The fascia overlying the tensor fascia lata was then closed with a running #1 V-Loc. Subcu was closed with interrupted 2-0 Vicryl and subcuticular running 4-0 Monocryl. Incision was cleaned  and dried. Steri-Strips and a bulky sterile dressing applied. Hemovac  drain was hooked to suction and then the patient was awakened and transported to  recovery in stable condition.        Please note that a surgical assistant was a medical necessity for this procedure to perform it in a safe and expeditious manner. Assistant was necessary to provide appropriate retraction of vital neurovascular structures and to prevent femoral fracture and allow for anatomic placement of the prosthesis.  Gaynelle Arabian, M.D.

## 2016-10-23 NOTE — Transfer of Care (Signed)
Immediate Anesthesia Transfer of Care Note  Patient: Alicia Schroeder  Procedure(s) Performed: Procedure(s): RIGHT TOTAL HIP ARTHROPLASTY ANTERIOR APPROACH (Right)  Patient Location: PACU  Anesthesia Type:Spinal  Level of Consciousness:  alert, patient cooperative and responds to stimulation  Airway & Oxygen Therapy:Patient Spontanous Breathing and Patient connected to face mask oxgen  Post-op Assessment:  Report given to PACU RN and Post -op Vital signs reviewed and stable  Post vital signs:  Reviewed and stable  Last Vitals:  Vitals:   10/23/16 1331  BP: (!) 144/80  Pulse: 64  Resp: 18  Temp: 69.5 C    Complications: No apparent anesthesia complications

## 2016-10-23 NOTE — Anesthesia Procedure Notes (Signed)
Spinal  Patient location during procedure: OR Start time: 10/23/2016 2:46 PM End time: 10/23/2016 2:52 PM Staffing Anesthesiologist: [name] Resident/CRNA: Darlys Gales R Performed: resident/CRNA  Preanesthetic Checklist Completed: patient identified, site marked, surgical consent, pre-op evaluation, timeout performed, IV checked, risks and benefits discussed and monitors and equipment checked Spinal Block Patient position: sitting Prep: DuraPrep Patient monitoring: heart rate, cardiac monitor, continuous pulse ox and blood pressure Approach: midline Location: L3-4 Injection technique: single-shot Needle Needle type: Pencan  Needle gauge: 24 G Needle length: 10 cm Needle insertion depth: 7 cm Assessment Sensory level: T6

## 2016-10-24 ENCOUNTER — Encounter (HOSPITAL_COMMUNITY): Payer: Self-pay | Admitting: Orthopedic Surgery

## 2016-10-24 LAB — BASIC METABOLIC PANEL
Anion gap: 7 (ref 5–15)
BUN: 8 mg/dL (ref 6–20)
CO2: 24 mmol/L (ref 22–32)
CREATININE: 0.56 mg/dL (ref 0.44–1.00)
Calcium: 9.8 mg/dL (ref 8.9–10.3)
Chloride: 106 mmol/L (ref 101–111)
GFR calc Af Amer: >60 mL/min (ref >60)
GLUCOSE: 155 mg/dL — AB (ref 65–99)
POTASSIUM: 4.1 mmol/L (ref 3.5–5.1)
SODIUM: 137 mmol/L (ref 135–145)

## 2016-10-24 LAB — CBC
HCT: 32.5 % — ABNORMAL LOW (ref 36.0–46.0)
Hemoglobin: 10.9 g/dL — ABNORMAL LOW (ref 12.0–15.0)
MCH: 29.7 pg (ref 26.0–34.0)
MCHC: 33.5 g/dL (ref 30.0–36.0)
MCV: 88.6 fL (ref 78.0–100.0)
PLATELETS: 269 10*3/uL (ref 150–400)
RBC: 3.67 MIL/uL — ABNORMAL LOW (ref 3.87–5.11)
RDW: 13.7 % (ref 11.5–15.5)
WBC: 10.1 10*3/uL (ref 4.0–10.5)

## 2016-10-24 MED ORDER — FLEET ENEMA 7-19 GM/118ML RE ENEM: 1.0000 | ENEMA | Freq: Once | RECTAL | 0 refills | Status: DC | PRN

## 2016-10-24 MED ORDER — ACETAMINOPHEN 325 MG PO TABS: 650.0000 mg | ORAL_TABLET | Freq: Four times a day (QID) | ORAL | 0 refills | Status: DC | PRN

## 2016-10-24 MED ORDER — OXYCODONE HCL 5 MG PO TABS: 5.0000 mg | ORAL_TABLET | ORAL | 0 refills | Status: DC | PRN

## 2016-10-24 MED ORDER — POLYETHYLENE GLYCOL 3350 17 G PO PACK: 17.0000 g | PACK | Freq: Every day | ORAL | 0 refills | Status: DC | PRN

## 2016-10-24 MED ORDER — DOCUSATE SODIUM 100 MG PO CAPS: 100.0000 mg | ORAL_CAPSULE | Freq: Two times a day (BID) | ORAL | 0 refills | Status: DC

## 2016-10-24 MED ORDER — RIVAROXABAN 10 MG PO TABS: 10.0000 mg | ORAL_TABLET | Freq: Every day | ORAL | 0 refills | Status: DC

## 2016-10-24 MED ORDER — METHOCARBAMOL 500 MG PO TABS: 500.0000 mg | ORAL_TABLET | Freq: Four times a day (QID) | ORAL | 0 refills | Status: DC | PRN

## 2016-10-24 MED ORDER — TRAMADOL HCL 50 MG PO TABS
50.0000 mg | ORAL_TABLET | Freq: Four times a day (QID) | ORAL | 0 refills | Status: DC | PRN
Start: 2016-10-24 — End: 2017-03-07

## 2016-10-24 MED ORDER — BISACODYL 10 MG RE SUPP: 10.0000 mg | Freq: Every day | RECTAL | 0 refills | Status: DC | PRN

## 2016-10-24 NOTE — Evaluation (Signed)
Physical Therapy Evaluation Patient Details Name: Alicia Schroeder MRN: [id number] DOB: [birth year] Today's Date: 10/24/2016   History of Present Illness  Pt s/p R THR and with hx of L THR, L ankle fx, L collarbone fx   Clinical Impression  Pt s/p R THR and presents with decreased R LE strength/ROM and post op pain limiting functional mobility.  Pt would benefit from follow up rehab at SNF level to maximize IND and safety prior to return home ALONE.    Follow Up Recommendations SNF    Equipment Recommendations  Rolling walker with 5" wheels    Recommendations for Other Services OT consult     Precautions / Restrictions Precautions Precautions: Fall Restrictions Weight Bearing Restrictions: No Other Position/Activity Restrictions: WBAT      Mobility  Bed Mobility Overal bed mobility: Needs Assistance Bed Mobility: Supine to Sit     Supine to sit: Min assist;Mod assist     General bed mobility comments: Increased time and cues for sequence and use of L LE to self assist.  Physical assist to manage R LE and bring trunk to upright  Transfers Overall transfer level: Needs assistance Equipment used: Rolling walker (2 wheeled) Transfers: Sit to/from Stand Sit to Stand: Min assist;Mod assist;From elevated surface         General transfer comment: cues for LE management and use of UEs to self assist  Ambulation/Gait Ambulation/Gait assistance: Min assist Ambulation Distance (Feet): 35 Feet Assistive device: Rolling walker (2 wheeled) Gait Pattern/deviations: Step-to pattern;Decreased step length - right;Decreased step length - left;Shuffle;Trunk flexed Gait velocity: decr Gait velocity interpretation: Below normal speed for age/gender General Gait Details: cues for sequence, posture, position from RW and ER on L  Stairs            Wheelchair Mobility    Modified Rankin (Stroke Patients Only)       Balance                                              Pertinent Vitals/Pain Pain Assessment: 0-10 Pain Score: 6  Pain Location: R hip Pain Descriptors / Indicators: Aching;Sore Pain Intervention(s): Limited activity within patient's tolerance;Monitored during session;Premedicated before session    Home Living Family/patient expects to be discharged to:: Skilled nursing facility Living Arrangements: Alone                    Prior Function Level of Independence: Needs assistance   Gait / Transfers Assistance Needed: Pt using cane prior to surg           Hand Dominance   Dominant Hand: Right    Extremity/Trunk Assessment   Upper Extremity Assessment Upper Extremity Assessment: Overall WFL for tasks assessed    Lower Extremity Assessment Lower Extremity Assessment: RLE deficits/detail RLE Deficits / Details: Strength at hip 2+/5 with AAROM at hip to 80 flex and 15 abd    Cervical / Trunk Assessment Cervical / Trunk Assessment: Normal  Communication   Communication: No difficulties  Cognition Arousal/Alertness: Awake/alert Behavior During Therapy: WFL for tasks assessed/performed Overall Cognitive Status: Within Functional Limits for tasks assessed                                        General Comments  Exercises Total Joint Exercises Ankle Circles/Pumps: AROM;Both;15 reps;Supine Quad Sets: AROM;Both;5 reps;Supine Heel Slides: AAROM;Right;15 reps;Supine Hip ABduction/ADduction: AAROM;Right;10 reps;Supine   Assessment/Plan    PT Assessment Patient needs continued PT services  PT Problem List Decreased strength;Decreased range of motion;Decreased activity tolerance;Decreased mobility;Decreased knowledge of use of DME;Obesity;Pain       PT Treatment Interventions DME instruction;Gait training;Stair training;Functional mobility training;Therapeutic activities;Therapeutic exercise;Patient/family education    PT Goals (Current goals can be found in the Care Plan section)   Acute Rehab PT Goals Patient Stated Goal: Regain IND PT Goal Formulation: With patient Time For Goal Achievement: 10/30/16 Potential to Achieve Goals: Good    Frequency 7X/week   Barriers to discharge        Co-evaluation               AM-PAC PT "6 Clicks" Daily Activity  Outcome Measure Difficulty turning over in bed (including adjusting bedclothes, sheets and blankets)?: A Lot Difficulty moving from lying on back to sitting on the side of the bed? : A Lot Difficulty sitting down on and standing up from a chair with arms (e.g., wheelchair, bedside commode, etc,.)?: A Lot Help needed moving to and from a bed to chair (including a wheelchair)?: A Little Help needed walking in hospital room?: A Little Help needed climbing 3-5 steps with a railing? : A Lot 6 Click Score: 14    End of Session Equipment Utilized During Treatment: Gait belt Activity Tolerance: Patient tolerated treatment well Patient left: in chair;with call bell/phone within reach Nurse Communication: Mobility status PT Visit Diagnosis: Difficulty in walking, not elsewhere classified (R26.2)    Time: 2094-7096 PT Time Calculation (min) (ACUTE ONLY): 38 min   Charges:   PT Evaluation $PT Eval Low Complexity: 1 Procedure PT Treatments $Gait Training: 8-22 mins $Therapeutic Exercise: 8-22 mins   PT G Codes:        Pg [phone]   Zohar Maroney 10/24/2016, 9:34 AM

## 2016-10-24 NOTE — Progress Notes (Signed)
Plan for d/c to SNF, discharge planning per CSW. [phone] 

## 2016-10-24 NOTE — Progress Notes (Signed)
   Subjective: 1 Day Post-Op Procedure(s) (LRB): RIGHT TOTAL HIP ARTHROPLASTY ANTERIOR APPROACH (Right) Patient reports pain as mild.   Patient seen in rounds with Dr. Wynelle Link.  Sore this morning. Discussed living situation.  Wants to look into [facility].  Will get social worker involved. Patient is well, but has had some minor complaints of pain in the hip, requiring pain medications We will start therapy today.  Plan is to go Skilled nursing facility after hospital stay.  Objective: Vital signs in last 24 hours: Temp:  [97.4 F (36.3 C)-98.7 F (37.1 C)] 98.5 F (36.9 C) (06/07 0541) Pulse Rate:  [48-82] 66 (06/07 0541) Resp:  [12-20] 14 (06/07 0541) BP: (124-175)/(63-134) 129/63 (06/07 0547) SpO2:  [95 %-100 %] 98 % (06/07 0541) Weight:  [104.3 kg (230 lb)] 104.3 kg (230 lb) (06/06 1331)  Intake/Output from previous day:  Intake/Output Summary (Last 24 hours) at 10/24/16 0819 Last data filed at 10/24/16 0547  Gross per 24 hour  Intake             3945 ml  Output             4410 ml  Net             -465 ml    Intake/Output this shift: No intake/output data recorded.  Labs:  Recent Labs  10/24/16 0447  HGB 10.9*    Recent Labs  10/24/16 0447  WBC 10.1  RBC 3.67*  HCT 32.5*  PLT 269    Recent Labs  10/24/16 0447  NA 137  K 4.1  CL 106  CO2 24  BUN 8  CREATININE 0.56  GLUCOSE 155*  CALCIUM 9.8   No results for input(s): LABPT, INR in the last 72 hours.  EXAM General - Patient is Alert, Appropriate and Oriented Extremity - Neurovascular intact Sensation intact distally Dorsiflexion/Plantar flexion intact Dressing - dressing C/D/I Motor Function - intact, moving foot and toes well on exam.  Hemovac pulled without difficulty.  Past Medical History:  Diagnosis Date  . Abnormal ECG   . Aortic valve sclerosis    no evidence per patient   . Arthritis     right greater the left hip  . Cancer ([place])    basal cell carcinoma removed- 24 years ago    . Depression    no meds  . Dysrhythmia    has soft Systolic Ejection Murmur  . Family history of anesthesia complication    sisterin law disabled from anesthesia   . Family history of premature coronary artery disease    Father with extensive coronary and carotid disease  . Hyperlipidemia   . Hypertension   . Pneumonia    15 years ago approximately   . Premature ventricular contractions     Assessment/Plan: 1 Day Post-Op Procedure(s) (LRB): RIGHT TOTAL HIP ARTHROPLASTY ANTERIOR APPROACH (Right) Active Problems:   OA (osteoarthritis) of hip  Estimated body mass index is 39.48 kg/m as calculated from the following:   Height as of this encounter: 5\' 4"  (1.626 m).   Weight as of this encounter: 104.3 kg (230 lb). Advance diet Up with therapy Discharge to SNF - wants [facility] DC foley after first therapy session Resume Lisinopril and place under parameter  DVT Prophylaxis - Xarelto Weight Bearing As Tolerated right Leg Hemovac Pulled Begin Therapy Foley out this morning. Consult Social Worker for placement  Arlee Muslim, PA-C Orthopaedic Surgery 10/24/2016, 8:19 AM

## 2016-10-24 NOTE — Progress Notes (Signed)
Physical Therapy Treatment Patient Details Name: Alicia Schroeder MRN: [id number] DOB: [birth year] Today's Date: 10/24/2016    History of Present Illness Pt s/p R THR and with hx of L THR, L ankle fx, L collarbone fx     PT Comments    Pt motivated and making steady progress with mobility.  Follow Up Recommendations  SNF     Equipment Recommendations  Rolling walker with 5" wheels    Recommendations for Other Services OT consult     Precautions / Restrictions Precautions Precautions: Fall Restrictions Weight Bearing Restrictions: No Other Position/Activity Restrictions: WBAT    Mobility  Bed Mobility               General bed mobility comments: NT - Pt OOB and requests back to chair  Transfers Overall transfer level: Needs assistance Equipment used: Rolling walker (2 wheeled) Transfers: Sit to/from Stand Sit to Stand: Min assist         General transfer comment: cues for LE management and use of UEs to self assist  Ambulation/Gait Ambulation/Gait assistance: Min assist Ambulation Distance (Feet): 90 Feet (and 15' into bathroom) Assistive device: Rolling walker (2 wheeled) Gait Pattern/deviations: Step-to pattern;Decreased step length - right;Decreased step length - left;Shuffle;Trunk flexed Gait velocity: decr Gait velocity interpretation: Below normal speed for age/gender General Gait Details: cues for sequence, posture, position from RW and ER on L   Stairs            Wheelchair Mobility    Modified Rankin (Stroke Patients Only)       Balance                                            Cognition Arousal/Alertness: Awake/alert Behavior During Therapy: WFL for tasks assessed/performed Overall Cognitive Status: Within Functional Limits for tasks assessed                                        Exercises      General Comments        Pertinent Vitals/Pain Pain Assessment: 0-10 Pain Score: 5  Pain  Location: R hip Pain Descriptors / Indicators: Aching;Sore Pain Intervention(s): Limited activity within patient's tolerance;Monitored during session;Premedicated before session;Ice applied    Home Living                      Prior Function            PT Goals (current goals can now be found in the care plan section) Acute Rehab PT Goals Patient Stated Goal: Regain IND PT Goal Formulation: With patient Time For Goal Achievement: 10/30/16 Potential to Achieve Goals: Good Progress towards PT goals: Progressing toward goals    Frequency    7X/week      PT Plan Current plan remains appropriate    Co-evaluation              AM-PAC PT "6 Clicks" Daily Activity  Outcome Measure  Difficulty turning over in bed (including adjusting bedclothes, sheets and blankets)?: A Lot Difficulty moving from lying on back to sitting on the side of the bed? : A Lot Difficulty sitting down on and standing up from a chair with arms (e.g., wheelchair, bedside commode, etc,.)?: A Little Help needed moving to and from  a bed to chair (including a wheelchair)?: A Little Help needed walking in hospital room?: A Little Help needed climbing 3-5 steps with a railing? : A Lot 6 Click Score: 15    End of Session Equipment Utilized During Treatment: Gait belt Activity Tolerance: Patient tolerated treatment well Patient left: in chair;with call bell/phone within reach Nurse Communication: Mobility status PT Visit Diagnosis: Difficulty in walking, not elsewhere classified (R26.2)     Time: 0981-1914 PT Time Calculation (min) (ACUTE ONLY): 23 min  Charges:                       G Codes:       Pg [phone]    Donabelle Molden 10/24/2016, 2:08 PM

## 2016-10-24 NOTE — NC FL2 (Signed)
[place] LEVEL OF CARE SCREENING TOOL     IDENTIFICATION  Patient Name: Alicia Schroeder Birthdate: 1951-05-16 Sex: female Admission Date (Current Location): 10/23/2016  [facility] and [place] Number:  Herbalist and Address:  [facility],  [place] [address] [place]      Provider Number: [id number]  Attending Physician Name and Address:  Gaynelle Arabian, MD  Relative Name and Phone Number:       Current Level of Care: SNF Recommended Level of Care: [place] Prior Approval Number:    Date Approved/Denied:   PASRR Number:    Discharge Plan: SNF    Current Diagnoses: Patient Active Problem List   Diagnosis Date Noted  . Vitamin D insufficiency 09/20/2015  . Hypercalcemia 09/20/2015  . Osteopenia 09/20/2015  . Pathologic fracture of clavicle 12/27/2014  . Pathologic fracture 12/26/2014  . Postoperative anemia due to acute blood loss 09/30/2013  . OA (osteoarthritis) of hip 09/29/2013  . Menopause 09/28/2013  . Cancer of the skin, basal cell 09/28/2013  . Obesity (BMI 30-39.9) 07/20/2013  . Preoperative cardiovascular examination 07/20/2013  . PVC's (premature ventricular contractions) 07/20/2013  . Essential hypertension 07/20/2013  . Mixed hyperlipidemia 07/20/2013    Orientation RESPIRATION BLADDER Height & Weight     Self, Time, Situation, Place  Normal Continent Weight: 230 lb (104.3 kg) Height:  5\' 4"  (162.6 cm)  BEHAVIORAL SYMPTOMS/MOOD NEUROLOGICAL BOWEL NUTRITION STATUS  Other (Comment) (no behaviors)   Continent Diet  AMBULATORY STATUS COMMUNICATION OF NEEDS Skin   Limited Assist Verbally Surgical wounds                       Personal Care Assistance Level of Assistance  Bathing, Feeding, Dressing Bathing Assistance: Limited assistance Feeding assistance: Independent Dressing Assistance: Limited assistance     Functional Limitations Info  Sight, Hearing, Speech Sight Info:  Adequate Hearing Info: Adequate Speech Info: Adequate    SPECIAL CARE FACTORS FREQUENCY  PT (By licensed PT), OT (By licensed OT)     PT Frequency: 5x wk OT Frequency: 5x wk            Contractures Contractures Info: Not present    Additional Factors Info  Code Status, Allergies Code Status Info: Full Code Allergies Info: ADHESIVE TAPE           Current Medications (10/24/2016):  This is the current hospital active medication list Current Facility-Administered Medications  Medication Dose Route Frequency Provider Last Rate Last Dose  . 0.9 %  sodium chloride infusion   Intravenous Continuous Perkins, Alexzandrew L, PA-C 75 mL/hr at 10/23/16 2157    . acebutolol (SECTRAL) capsule 200 mg  200 mg Oral BID Gaynelle Arabian, MD   200 mg at 10/23/16 1946  . acetaminophen (TYLENOL) tablet 650 mg  650 mg Oral Q6H PRN Gaynelle Arabian, MD       Or  . acetaminophen (TYLENOL) suppository 650 mg  650 mg Rectal Q6H PRN Aluisio, Pilar Plate, MD      . acetaminophen (TYLENOL) tablet 1,000 mg  1,000 mg Oral Q6H Aluisio, Frank, MD      . atorvastatin (LIPITOR) tablet 20 mg  20 mg Oral q1800 Aluisio, Pilar Plate, MD      . bisacodyl (DULCOLAX) suppository 10 mg  10 mg Rectal Daily PRN Gaynelle Arabian, MD      . dexamethasone (DECADRON) injection 10 mg  10 mg Intravenous Once Gaynelle Arabian, MD      .  diphenhydrAMINE (BENADRYL) 12.5 MG/5ML elixir 12.5-25 mg  12.5-25 mg Oral Q4H PRN Aluisio, Pilar Plate, MD      . docusate sodium (COLACE) capsule 100 mg  100 mg Oral BID Gaynelle Arabian, MD   100 mg at 10/23/16 2157  . loratadine (CLARITIN) tablet 10 mg  10 mg Oral Daily Aluisio, Pilar Plate, MD      . menthol-cetylpyridinium (CEPACOL) lozenge 3 mg  1 lozenge Oral PRN Gaynelle Arabian, MD       Or  . phenol (CHLORASEPTIC) mouth spray 1 spray  1 spray Mouth/Throat PRN Aluisio, Pilar Plate, MD      . methocarbamol (ROBAXIN) tablet 500 mg  500 mg Oral Q6H PRN Gaynelle Arabian, MD       Or  . methocarbamol (ROBAXIN) 500 mg in dextrose  5 % 50 mL IVPB  500 mg Intravenous Q6H PRN Gaynelle Arabian, MD   Stopped at 10/24/16 0230  . metoCLOPramide (REGLAN) tablet 5-10 mg  5-10 mg Oral Q8H PRN Aluisio, Pilar Plate, MD       Or  . metoCLOPramide (REGLAN) injection 5-10 mg  5-10 mg Intravenous Q8H PRN Aluisio, Pilar Plate, MD      . morphine 2 MG/ML injection 1 mg  1 mg Intravenous Q2H PRN Aluisio, Pilar Plate, MD      . ondansetron (ZOFRAN) tablet 4 mg  4 mg Oral Q6H PRN Gaynelle Arabian, MD       Or  . ondansetron (ZOFRAN) injection 4 mg  4 mg Intravenous Q6H PRN Aluisio, Pilar Plate, MD      . oxyCODONE (Oxy IR/ROXICODONE) immediate release tablet 5-10 mg  5-10 mg Oral Q3H PRN Gaynelle Arabian, MD   10 mg at 10/23/16 1946  . polyethylene glycol (MIRALAX / GLYCOLAX) packet 17 g  17 g Oral Daily PRN Aluisio, Pilar Plate, MD      . rivaroxaban Alveda Reasons) tablet 10 mg  10 mg Oral Q breakfast Aluisio, Pilar Plate, MD      . sodium phosphate (FLEET) 7-19 GM/118ML enema 1 enema  1 enema Rectal Once PRN Aluisio, Pilar Plate, MD      . traMADol Veatrice Bourbon) tablet 50-100 mg  50-100 mg Oral Q6H PRN Gaynelle Arabian, MD   100 mg at 10/24/16 0453     Discharge Medications: Please see discharge summary for a list of discharge medications.  Relevant Imaging Results:  Relevant Lab Results:   Additional Information SS # [id number]  Breyonna Nault, Randall An, LCSW

## 2016-10-24 NOTE — Discharge Summary (Signed)
Physician Discharge Summary   Patient ID: Alicia Schroeder MRN: [id number] DOB/AGE: 12-27-1950 66 y.o.  Admit date: 10/23/2016 Discharge date: Tentative Date of Transfer - Today 10/25/2016  Primary Diagnosis:  Osteoarthritis of the Right hip.  Admission Diagnoses:  Past Medical History:  Diagnosis Date  . Abnormal ECG   . Aortic valve sclerosis    no evidence per patient   . Arthritis     right greater the left hip  . Cancer ([place])    basal cell carcinoma removed- 24 years ago   . Depression    no meds  . Dysrhythmia    has soft Systolic Ejection Murmur  . Family history of anesthesia complication    sisterin law disabled from anesthesia   . Family history of premature coronary artery disease    Father with extensive coronary and carotid disease  . Hyperlipidemia   . Hypertension   . Pneumonia    15 years ago approximately   . Premature ventricular contractions    Discharge Diagnoses:   Active Problems:   OA (osteoarthritis) of hip  Estimated body mass index is 39.48 kg/m as calculated from the following:   Height as of this encounter: '5\' 4"'$  (1.626 m).   Weight as of this encounter: 104.3 kg (230 lb).  Procedure(s) (LRB): RIGHT TOTAL HIP ARTHROPLASTY ANTERIOR APPROACH (Right)   Consults: None  HPI: Alicia Schroeder is a 66 y.o. female who has advanced end-  stage arthritis of their Right  hip with progressively worsening pain and  dysfunction.The patient has failed nonoperative management and presents for  total hip arthroplasty.  Laboratory Data: Admission on 10/23/2016  Component Date Value Ref Range Status  . WBC 10/24/2016 10.1  4.0 - 10.5 K/uL Final  . RBC 10/24/2016 3.67* 3.87 - 5.11 MIL/uL Final  . Hemoglobin 10/24/2016 10.9* 12.0 - 15.0 g/dL Final  . HCT 10/24/2016 32.5* 36.0 - 46.0 % Final  . MCV 10/24/2016 88.6  78.0 - 100.0 fL Final  . MCH 10/24/2016 29.7  26.0 - 34.0 pg Final  . MCHC 10/24/2016 33.5  30.0 - 36.0 g/dL Final  . RDW 10/24/2016 13.7  11.5 -  15.5 % Final  . Platelets 10/24/2016 269  150 - 400 K/uL Final  . Sodium 10/24/2016 137  135 - 145 mmol/L Final  . Potassium 10/24/2016 4.1  3.5 - 5.1 mmol/L Final  . Chloride 10/24/2016 106  101 - 111 mmol/L Final  . CO2 10/24/2016 24  22 - 32 mmol/L Final  . Glucose, Bld 10/24/2016 155* 65 - 99 mg/dL Final  . BUN 10/24/2016 8  6 - 20 mg/dL Final  . Creatinine, Ser 10/24/2016 0.56  0.44 - 1.00 mg/dL Final  . Calcium 10/24/2016 9.8  8.9 - 10.3 mg/dL Final  . GFR calc non Af Amer 10/24/2016 >60  >60 mL/min Final  . GFR calc Af Amer 10/24/2016 >60  >60 mL/min Final   Comment: (NOTE) The eGFR has been calculated using the CKD EPI equation. This calculation has not been validated in all clinical situations. eGFR's persistently <60 mL/min signify possible Chronic Kidney Disease.   Georgiann Hahn gap 10/24/2016 7  5 - 15 Final  Hospital Outpatient Visit on 10/17/2016  Component Date Value Ref Range Status  . aPTT 10/17/2016 36  24 - 36 seconds Final  . WBC 10/17/2016 6.8  4.0 - 10.5 K/uL Final  . RBC 10/17/2016 4.27  3.87 - 5.11 MIL/uL Final  . Hemoglobin 10/17/2016 12.6  12.0 - 15.0 g/dL Final  .  HCT 10/17/2016 38.9  36.0 - 46.0 % Final  . MCV 10/17/2016 91.1  78.0 - 100.0 fL Final  . MCH 10/17/2016 29.5  26.0 - 34.0 pg Final  . MCHC 10/17/2016 32.4  30.0 - 36.0 g/dL Final  . RDW 10/17/2016 13.8  11.5 - 15.5 % Final  . Platelets 10/17/2016 327  150 - 400 K/uL Final  . Sodium 10/17/2016 144  135 - 145 mmol/L Final  . Potassium 10/17/2016 4.6  3.5 - 5.1 mmol/L Final  . Chloride 10/17/2016 106  101 - 111 mmol/L Final  . CO2 10/17/2016 29  22 - 32 mmol/L Final  . Glucose, Bld 10/17/2016 109* 65 - 99 mg/dL Final  . BUN 10/17/2016 17  6 - 20 mg/dL Final  . Creatinine, Ser 10/17/2016 0.66  0.44 - 1.00 mg/dL Final  . Calcium 10/17/2016 10.8* 8.9 - 10.3 mg/dL Final  . Total Protein 10/17/2016 7.3  6.5 - 8.1 g/dL Final  . Albumin 10/17/2016 4.5  3.5 - 5.0 g/dL Final  . AST 10/17/2016 23  15 - 41  U/L Final  . ALT 10/17/2016 19  14 - 54 U/L Final  . Alkaline Phosphatase 10/17/2016 85  38 - 126 U/L Final  . Total Bilirubin 10/17/2016 0.8  0.3 - 1.2 mg/dL Final  . GFR calc non Af Amer 10/17/2016 >60  >60 mL/min Final  . GFR calc Af Amer 10/17/2016 >60  >60 mL/min Final   Comment: (NOTE) The eGFR has been calculated using the CKD EPI equation. This calculation has not been validated in all clinical situations. eGFR's persistently <60 mL/min signify possible Chronic Kidney Disease.   . Anion gap 10/17/2016 9  5 - 15 Final  . Prothrombin Time 10/17/2016 13.2  11.4 - 15.2 seconds Final  . INR 10/17/2016 1.00   Final  . ABO/RH(D) 10/17/2016 A POS   Final  . Antibody Screen 10/17/2016 NEG   Final  . Sample Expiration 10/17/2016 10/26/2016   Final  . Extend sample reason 10/17/2016 NO TRANSFUSIONS OR PREGNANCY IN THE PAST 3 MONTHS   Final  . MRSA, PCR 10/17/2016 NEGATIVE  NEGATIVE Final  . Staphylococcus aureus 10/17/2016 NEGATIVE  NEGATIVE Final   Comment:        The Xpert SA Assay (FDA approved for NASAL specimens in patients over 69 years of age), is one component of a comprehensive surveillance program.  Test performance has been validated by [facility] for patients greater than or equal to 107 year old. It is not intended to diagnose infection nor to guide or monitor treatment.      X-Rays:Us Soft Tissue Head And Neck  Result Date: 10/02/2016 CLINICAL DATA:  Other.  Hypercalcemia EXAM: THYROID ULTRASOUND TECHNIQUE: Ultrasound examination of the thyroid gland and adjacent soft tissues was performed. COMPARISON:  Nuclear medicine parathyroid scintigraphy - 09/24/2016 FINDINGS: Parenchymal Echotexture: Normal Isthmus: Normal in size measures 0.4 cm in diameter Right lobe: Normal in size measuring 4.3 x 1.2 x 1.2 cm Left lobe: Normal in size measuring 4.2 x 0.8 x 1.2 cm _________________________________________________________ Estimated total number of nodules >/= 1 cm: 0 Number  of spongiform nodules >/=  2 cm not described below (TR1): 0 Number of mixed cystic and solid nodules >/= 1.5 cm not described below (TR2): 0 _________________________________________________________ Nodule # 1: Location: Left; Mid Maximum size: 0.9 cm; Other 2 dimensions: 0.4 x 0.6 cm Composition: solid/almost completely solid (2) Echogenicity: hypoechoic (2) Shape: not taller-than-wide (0) Margins: ill-defined (0) Echogenic foci: none (0) ACR  TI-RADS total points: 4. ACR TI-RADS risk category: TR4 (4-6 points). ACR TI-RADS recommendations: Given size (<0.9 cm) and appearance, this nodule does NOT meet TI-RADS criteria for biopsy or dedicated follow-up. _________________________________________________________ No discrete abnormal extra thyroidal nodules or masses. IMPRESSION: 1. Solitary punctate (approximately 0.9 cm) nodule within the left lobe of the thyroid does not meet imaging criteria to recommend percutaneous sampling or dedicated follow-up. 2. No discrete abnormal extra thyroidal nodules or masses to suggest the presence of a parathyroid adenoma. The above is in keeping with the ACR TI-RADS recommendations - J Am Coll Radiol 2017;14:587-595. Electronically Signed   By: Sandi Mariscal M.D.   On: 10/02/2016 08:23   Dg Pelvis Portable  Result Date: 10/23/2016 CLINICAL DATA:  Interval right total hip arthroplasty EXAM: PORTABLE PELVIS 1-2 VIEWS COMPARISON:  09/29/2013 pelvic radiograph FINDINGS: Interval right total hip arthroplasty, with well-positioned prostheses. Previous left total hip arthroplasty. No evidence of hip dislocation on this single frontal view. No osseous fracture. No suspicious focal osseous lesions. Expected postoperative gas surrounding the right hip joint. IMPRESSION: Satisfactory single frontal view immediate postoperative appearance status post right total hip arthroplasty. Electronically Signed   By: Ilona Sorrel M.D.   On: 10/23/2016 18:25   Dg C-arm 1-60 Min-no Report  Result  Date: 10/23/2016 Fluoroscopy was utilized by the requesting physician.  No radiographic interpretation.   Ct Parathyroid 4d Neck W/wo  Result Date: 10/18/2016 CLINICAL DATA:  66 year old female with hypercalcemia, hoarseness. EXAM: CT NECK WITH AND WITHOUT CONTRAST TECHNIQUE: Multidetector CT imaging of the neck was performed without and with intravenous contrast. CONTRAST:  75 mL Isovue 370 COMPARISON:  Sestamibi parathyroid scan 09/24/2016. Thyroid ultrasound 10/01/2016 FINDINGS: Pharynx and larynx: Negative. Negative parapharyngeal and retropharyngeal spaces. Salivary glands: Negative sublingual space, sublingual glands, submandibular glands and visible parotid glands. Thyroid: Negative. Lymph nodes: Negative.  No cervical lymphadenopathy. Vascular: Major vascular structures in the neck and superior mediastinum are patent. There is proximal bilateral carotid and left vertebral artery tortuosity. The left vertebral artery is mildly dominant. Skeleton: Multilevel cervical spine disc, endplate, and left side facet hypertrophy. No acute osseous abnormality identified. Upper chest: Normal visualized mediastinum. Negative visible lung parenchyma. Other: Just inferior to the left lobe of the thyroid best seen on coronal series 601, image 42 there is a 10 mm oval soft tissue nodule (corresponding to that on series 6, image 47) which probably reflects parathyroid tissue. This does not appear as hypervascular or conspicuous as a typical parathyroid adenoma. And there is no suspicious soft tissue nodularity elsewhere in the visible neck or upper mediastinum. IMPRESSION: 1. Negative for convincing parathyroid adenoma. I do suspect that an inconspicuous 10 mm oval soft tissue nodule just caudal to the left thyroid lobe (series 601, image 42) does reflect parathyroid tissue, but is probably physiologic. 2. Negative neck CT otherwise. Electronically Signed   By: Genevie Ann M.D.   On: 10/18/2016 16:29    EKG: Orders placed or  performed in visit on 10/09/16  . EKG 12-Lead     Hospital Course: Patient was admitted to [facility] and taken to the OR and underwent the above state procedure without complications.  Patient tolerated the procedure well and was later transferred to the recovery room and then to the orthopaedic floor for postoperative care.  They were given PO and IV analgesics for pain control following their surgery.  They were given 24 hours of postoperative antibiotics of  Anti-infectives    Start  Dose/Rate Route Frequency Ordered Stop   10/23/16 2200  ceFAZolin (ANCEF) IVPB 2g/100 mL premix     2 g 200 mL/hr over 30 Minutes Intravenous Every 6 hours 10/23/16 1820 10/24/16 0330   10/23/16 1317  ceFAZolin (ANCEF) 2-4 GM/100ML-% IVPB  Status:  Discontinued    Comments:  Harvell, Gwendolyn  : cabinet override      10/23/16 1317 10/23/16 1412   10/23/16 1306  ceFAZolin (ANCEF) 2-4 GM/100ML-% IVPB    Comments:  [name]   : cabinet override      10/23/16 1306 10/23/16 1453   10/23/16 1300  ceFAZolin (ANCEF) IVPB 2g/100 mL premix     2 g 200 mL/hr over 30 Minutes Intravenous On call to O.R. 10/23/16 1258 10/23/16 1458     and started on DVT prophylaxis in the form of Xarelto.   PT and OT were ordered for total hip protocol.  The patient was allowed to be WBAT with therapy. Discharge planning was consulted to help with postop disposition and equipment needs. Social worker consulted to assist with placement as requested by the patient.  Patient had a decent night on the evening of surgery.  They started to get up OOB with therapy on day one.  Hemovac drain was pulled without difficulty.  Continued to work with therapy into day two.  Dressing was changed on day two and the incision was healing well. Patient was seen in rounds on POD 2 and was still waiting on insurance approval.  If approval arrives, then patient ready to go to [facility].  Diet: Cardiac diet Activity:WBAT Follow-up:in 2  weeks Disposition - Skilled nursing facility Discharged Condition: stable   Discharge Instructions    Call MD / Call 911    Complete by:  As directed    If you experience chest pain or shortness of breath, CALL 911 and be transported to the hospital emergency room.  If you develope a fever above 101 F, pus (white drainage) or increased drainage or redness at the wound, or calf pain, call your surgeon's office.   Change dressing    Complete by:  As directed    You may change your dressing dressing daily with sterile 4 x 4 inch gauze dressing and paper tape.  Do not submerge the incision under water.   Constipation Prevention    Complete by:  As directed    Drink plenty of fluids.  Prune juice may be helpful.  You may use a stool softener, such as Colace (over the counter) 100 mg twice a day.  Use MiraLax (over the counter) for constipation as needed.   Diet - low sodium heart healthy    Complete by:  As directed    Discharge instructions    Complete by:  As directed    Take Xarelto for two and a half more weeks, then discontinue Xarelto. Once the patient has completed the Xarelto, they may resume the 81 mg Aspirin.   Pick up stool softner and laxative for home use following surgery while on pain medications. Do not submerge incision under water. Please use good hand washing techniques while changing dressing each day. May shower starting three days after surgery. Please use a clean towel to pat the incision dry following showers. Continue to use ice for pain and swelling after surgery. Do not use any lotions or creams on the incision until instructed by your surgeon.  Wear both TED hose on both legs during the day every day for three weeks,  but may remove the TED hose at night at home.  Postoperative Constipation Protocol  Constipation - defined medically as fewer than three stools per week and severe constipation as less than one stool per week.  One of the most common issues  patients have following surgery is constipation.  Even if you have a regular bowel pattern at home, your normal regimen is likely to be disrupted due to multiple reasons following surgery.  Combination of anesthesia, postoperative narcotics, change in appetite and fluid intake all can affect your bowels.  In order to avoid complications following surgery, here are some recommendations in order to help you during your recovery period.  Colace (docusate) - Pick up an over-the-counter form of Colace or another stool softener and take twice a day as long as you are requiring postoperative pain medications.  Take with a full glass of water daily.  If you experience loose stools or diarrhea, hold the colace until you stool forms back up.  If your symptoms do not get better within 1 week or if they get worse, check with your doctor.  Dulcolax (bisacodyl) - Pick up over-the-counter and take as directed by the product packaging as needed to assist with the movement of your bowels.  Take with a full glass of water.  Use this product as needed if not relieved by Colace only.   MiraLax (polyethylene glycol) - Pick up over-the-counter to have on hand.  MiraLax is a solution that will increase the amount of water in your bowels to assist with bowel movements.  Take as directed and can mix with a glass of water, juice, soda, coffee, or tea.  Take if you go more than two days without a movement. Do not use MiraLax more than once per day. Call your doctor if you are still constipated or irregular after using this medication for 7 days in a row.  If you continue to have problems with postoperative constipation, please contact the office for further assistance and recommendations.  If you experience "the worst abdominal pain ever" or develop nausea or vomiting, please contact the office immediatly for further recommendations for treatment.    Do not sit on low chairs, stoools or toilet seats, as it may be difficult to get  up from low surfaces    Complete by:  As directed    Driving restrictions    Complete by:  As directed    No driving until released by the physician.   Increase activity slowly as tolerated    Complete by:  As directed    Lifting restrictions    Complete by:  As directed    No lifting until released by the physician.   Patient may shower    Complete by:  As directed    You may shower without a dressing once there is no drainage.  Do not wash over the wound.  If drainage remains, do not shower until drainage stops.   TED hose    Complete by:  As directed    Use stockings (TED hose) for 3 weeks on both leg(s).  You may remove them at night for sleeping.   Weight bearing as tolerated    Complete by:  As directed    Laterality:  right   Extremity:  Lower     Allergies as of 10/24/2016      Reactions   Adhesive [tape] Rash   Bandaids   Lidocaine    Pt had lidocaine at dentist office 30 years  ago ..       Medication List    STOP taking these medications   aspirin EC 81 MG tablet   celecoxib 200 MG capsule Commonly known as:  CELEBREX   co-enzyme Q-10 50 MG capsule   OMEGA-3 FISH OIL PO   OSTEO BI-FLEX TRIPLE STRENGTH PO   Turmeric 500 MG Caps   Vitamin D3 2000 units capsule     TAKE these medications   acebutolol 200 MG capsule Commonly known as:  SECTRAL TAKE ONE CAPSULE BY MOUTH TWICE DAILY   acetaminophen 325 MG tablet Commonly known as:  TYLENOL Take 2 tablets (650 mg total) by mouth every 6 (six) hours as needed for mild pain (or Fever >/= 101).   atorvastatin 20 MG tablet Commonly known as:  LIPITOR Take 20 mg by mouth daily at 6 PM. Patient takes in pm   bisacodyl 10 MG suppository Commonly known as:  DULCOLAX Place 1 suppository (10 mg total) rectally daily as needed for moderate constipation.   cetirizine 10 MG tablet Commonly known as:  ZYRTEC Take 10 mg by mouth every evening.   docusate sodium 100 MG capsule Commonly known as:  COLACE Take 1  capsule (100 mg total) by mouth 2 (two) times daily.   lisinopril 20 MG tablet Commonly known as:  PRINIVIL,ZESTRIL Take 20 mg by mouth every evening.   Magnesium Oxide 400 MG Caps Take 400 mg by mouth every morning.   methocarbamol 500 MG tablet Commonly known as:  ROBAXIN Take 1 tablet (500 mg total) by mouth every 6 (six) hours as needed for muscle spasms.   oxyCODONE 5 MG immediate release tablet Commonly known as:  Oxy IR/ROXICODONE Take 1-2 tablets (5-10 mg total) by mouth every 4 (four) hours as needed for moderate pain or severe pain.   polyethylene glycol packet Commonly known as:  MIRALAX / GLYCOLAX Take 17 g by mouth daily as needed for mild constipation.   rivaroxaban 10 MG Tabs tablet Commonly known as:  XARELTO Take 1 tablet (10 mg total) by mouth daily with breakfast. Take Xarelto for two and a half more weeks following discharge from the hospital, then discontinue Xarelto. Once the patient has completed the Xarelto, they may resume the 81 mg Aspirin. Start taking on:  10/25/2016   sodium phosphate 7-19 GM/118ML Enem Place 133 mLs (1 enema total) rectally once as needed for severe constipation.   traMADol 50 MG tablet Commonly known as:  ULTRAM Take 1-2 tablets (50-100 mg total) by mouth every 6 (six) hours as needed for moderate pain.      Follow-up Information    Gaynelle Arabian, MD. Schedule an appointment as soon as possible for a visit on 11/05/2016.   Specialty:  Orthopedic Surgery Contact information: [address] [place] [address] [phone]           Signed: Arlee Muslim, PA-C Orthopaedic Surgery 10/24/2016, 8:19 PM

## 2016-10-24 NOTE — Clinical Social Work Placement (Signed)
   CLINICAL SOCIAL WORK PLACEMENT  NOTE  Date:  10/24/2016  Patient Details  Name: Alicia Schroeder MRN: [id number] Date of Birth: 26-Nov-1950  Clinical Social Work is seeking post-discharge placement for this patient at the [place] level of care (*CSW will initial, date and re-position this form in  chart as items are completed):      Patient/family provided with [facility] Work Department's list of facilities offering this level of care within the geographic area requested by the patient (or if unable, by the patient's family).  Yes   Patient/family informed of their freedom to choose among providers that offer the needed level of care, that participate in Medicare, Medicaid or managed care program needed by the patient, have an available bed and are willing to accept the patient.  Yes   Patient/family informed of [facility]'s ownership interest in [facility] and [facility], as well as of the fact that they are under no obligation to receive care at these facilities.  PASRR submitted to EDS on 10/24/16     PASRR number received on 10/24/16     Existing PASRR number confirmed on       FL2 transmitted to all facilities in geographic area requested by pt/family on 10/24/16     FL2 transmitted to all facilities within larger geographic area on       Patient informed that his/her managed care company has contracts with or will negotiate with certain facilities, including the following:        Yes   Patient/family informed of bed offers received.  Patient chooses bed at [facility] recommends and patient chooses bed at      Patient to be transferred to   on  .  Patient to be transferred to facility by       Patient family notified on   of transfer.  Name of family member notified:        PHYSICIAN       Additional Comment:    _______________________________________________ Luretha Rued, [place] 10/24/2016, 1:42 PM

## 2016-10-24 NOTE — Clinical Social Work Note (Signed)
Clinical Social Work Assessment  Patient Details  Name: Alicia Schroeder MRN: [id number] Date of Birth: Dec 11, 1950  Date of referral:  10/24/16               Reason for consult:  Facility Placement, Discharge Planning                Permission sought to share information with:  Chartered certified accountant granted to share information::  Yes, Verbal Permission Granted  Name::        Agency::     Relationship::     Contact Information:     Housing/Transportation Living arrangements for the past 2 months:  Single Family Home Source of Information:  Patient Patient Interpreter Needed:  None Criminal Activity/Legal Involvement Pertinent to Current Situation/Hospitalization:  No - Comment as needed Significant Relationships:  Adult Children Lives with:  Self Do you feel safe going back to the place where you live?    Need for family participation in patient care:  No (Coment)  Care giving concerns: Pt requires more assistance than is available at home once d/c.   Social Worker assessment / plan:  Pt hospitalized from home on 10/23/16 for pre planned right total hip replacement. Pt has made prior arrangements for ST Rehab at [place] [facility] [place]. PT has recommended SNF at d/c. CSW has met with pt to assist with d/c planning. SNF has been contacted and clinicals sent for review. SNF is assisting with [facility] authorization. Pt reports that she will pay out of pocket for placement at [facility] if a decision has not been made by [employer] at the time of dc. SNF is aware of this. CSW will continue to follow to assist with d/c planning. Employment status:  Retired Nurse, adult PT Recommendations:  [place] / Referral to community resources:  [place]  Patient/Family's Response to care:  Pt plans for [employer] [facility] [place] [facility] [place].  Patient/Family's Understanding of and Emotional Response to Diagnosis,  Current Treatment, and Prognosis:  Pt is aware of her medical status. She is concerned regarding dc planning. Support / reassurance provided.  Emotional Assessment Appearance:  Appears stated age Attitude/Demeanor/Rapport:  Other (cooperative) Affect (typically observed):  Appropriate Orientation:  Oriented to Self, Oriented to Place, Oriented to  Time, Oriented to Situation Alcohol / Substance use:  Not Applicable Psych involvement (Current and /or in the community):  No (Comment)  Discharge Needs  Concerns to be addressed:  Discharge Planning Concerns Readmission within the last 30 days:  No Current discharge risk:  None Barriers to Discharge:  No Barriers Identified   Luretha Rued, [place] 10/24/2016, 1:30 PM

## 2016-10-25 DIAGNOSIS — I10 Essential (primary) hypertension: Secondary | ICD-10-CM | POA: Diagnosis not present

## 2016-10-25 DIAGNOSIS — M1990 Unspecified osteoarthritis, unspecified site: Secondary | ICD-10-CM | POA: Diagnosis not present

## 2016-10-25 DIAGNOSIS — M8580 Other specified disorders of bone density and structure, unspecified site: Secondary | ICD-10-CM | POA: Diagnosis not present

## 2016-10-25 DIAGNOSIS — Z85828 Personal history of other malignant neoplasm of skin: Secondary | ICD-10-CM | POA: Diagnosis not present

## 2016-10-25 DIAGNOSIS — E559 Vitamin D deficiency, unspecified: Secondary | ICD-10-CM | POA: Diagnosis not present

## 2016-10-25 DIAGNOSIS — M1611 Unilateral primary osteoarthritis, right hip: Secondary | ICD-10-CM | POA: Diagnosis not present

## 2016-10-25 DIAGNOSIS — Z96641 Presence of right artificial hip joint: Secondary | ICD-10-CM | POA: Diagnosis not present

## 2016-10-25 DIAGNOSIS — E669 Obesity, unspecified: Secondary | ICD-10-CM | POA: Diagnosis not present

## 2016-10-25 DIAGNOSIS — R69 Illness, unspecified: Secondary | ICD-10-CM | POA: Diagnosis not present

## 2016-10-25 DIAGNOSIS — E785 Hyperlipidemia, unspecified: Secondary | ICD-10-CM | POA: Diagnosis not present

## 2016-10-25 DIAGNOSIS — Z471 Aftercare following joint replacement surgery: Secondary | ICD-10-CM | POA: Diagnosis not present

## 2016-10-25 DIAGNOSIS — K5900 Constipation, unspecified: Secondary | ICD-10-CM | POA: Diagnosis not present

## 2016-10-25 LAB — BASIC METABOLIC PANEL
Anion gap: 5 (ref 5–15)
BUN: 9 mg/dL (ref 6–20)
CHLORIDE: 105 mmol/L (ref 101–111)
CO2: 29 mmol/L (ref 22–32)
CREATININE: 0.45 mg/dL (ref 0.44–1.00)
Calcium: 9.9 mg/dL (ref 8.9–10.3)
GFR calc non Af Amer: >60 mL/min (ref >60)
Glucose, Bld: 129 mg/dL — ABNORMAL HIGH (ref 65–99)
POTASSIUM: 4 mmol/L (ref 3.5–5.1)
SODIUM: 139 mmol/L (ref 135–145)

## 2016-10-25 LAB — CBC
HEMATOCRIT: 30.5 % — AB (ref 36.0–46.0)
HEMOGLOBIN: 10.3 g/dL — AB (ref 12.0–15.0)
MCH: 30.5 pg (ref 26.0–34.0)
MCHC: 33.8 g/dL (ref 30.0–36.0)
MCV: 90.2 fL (ref 78.0–100.0)
Platelets: 262 10*3/uL (ref 150–400)
RBC: 3.38 MIL/uL — AB (ref 3.87–5.11)
RDW: 14.2 % (ref 11.5–15.5)
WBC: 10.7 10*3/uL — AB (ref 4.0–10.5)

## 2016-10-25 NOTE — Progress Notes (Signed)
Subjective: 2 Days Post-Op Procedure(s) (LRB): RIGHT TOTAL HIP ARTHROPLASTY ANTERIOR APPROACH (Right) Patient reports pain as mild.   Patient seen in rounds for Dr. Wynelle Link.  She is doing very well after surgery.  Preop pain much improved. Awaiting insurance approval.  If receives approval, then transfer today.  If not approved will address disposition at that time. Patient is well, but has had some minor complaints of pain in the hip', requiring pain medications Patient is ready to go to the SNF, pending insurance approval.  Objective: Vital signs in last 24 hours: Temp:  [98.5 F (36.9 C)-99 F (37.2 C)] 98.5 F (36.9 C) (06/08 0629) Pulse Rate:  [69-91] 71 (06/08 0629) Resp:  [15] 15 (06/08 0629) BP: (126-151)/(70-78) 145/78 (06/08 0629) SpO2:  [94 %-97 %] 97 % (06/08 0629)  Intake/Output from previous day:  Intake/Output Summary (Last 24 hours) at 10/25/16 0818 Last data filed at 10/25/16 0636  Gross per 24 hour  Intake             1200 ml  Output             3375 ml  Net            -2175 ml    Intake/Output this shift: No intake/output data recorded.  Labs:  Recent Labs  10/24/16 0447 10/25/16 0516  HGB 10.9* 10.3*    Recent Labs  10/24/16 0447 10/25/16 0516  WBC 10.1 10.7*  RBC 3.67* 3.38*  HCT 32.5* 30.5*  PLT 269 262    Recent Labs  10/24/16 0447 10/25/16 0516  NA 137 139  K 4.1 4.0  CL 106 105  CO2 24 29  BUN 8 9  CREATININE 0.56 0.45  GLUCOSE 155* 129*  CALCIUM 9.8 9.9   No results for input(s): LABPT, INR in the last 72 hours.  EXAM: General - Patient is Alert, Appropriate and Oriented Extremity - Neurovascular intact Sensation intact distally Intact pulses distally Dorsiflexion/Plantar flexion intact Incision - clean, dry, no drainage Motor Function - intact, moving foot and toes well on exam.   Assessment/Plan: 2 Days Post-Op Procedure(s) (LRB): RIGHT TOTAL HIP ARTHROPLASTY ANTERIOR APPROACH (Right) Procedure(s)  (LRB): RIGHT TOTAL HIP ARTHROPLASTY ANTERIOR APPROACH (Right) Past Medical History:  Diagnosis Date  . Abnormal ECG   . Aortic valve sclerosis    no evidence per patient   . Arthritis     right greater the left hip  . Cancer ([place])    basal cell carcinoma removed- 24 years ago   . Depression    no meds  . Dysrhythmia    has soft Systolic Ejection Murmur  . Family history of anesthesia complication    sisterin law disabled from anesthesia   . Family history of premature coronary artery disease    Father with extensive coronary and carotid disease  . Hyperlipidemia   . Hypertension   . Pneumonia    15 years ago approximately   . Premature ventricular contractions    Active Problems:   OA (osteoarthritis) of hip  Estimated body mass index is 39.48 kg/m as calculated from the following:   Height as of this encounter: 5\' 4"  (1.626 m).   Weight as of this encounter: 104.3 kg (230 lb). Up with therapy Discharge to SNF - pending insurance approval Diet - Cardiac diet Follow up - in 2 weeks Activity - WBAT Disposition - Skilled nursing facility Condition Upon Discharge - pending D/C Meds - See DC Summary DVT Prophylaxis - Xarelto  Arlee Muslim, PA-C Orthopaedic Surgery 10/25/2016, 8:18 AM

## 2016-10-25 NOTE — Progress Notes (Signed)
Report called to Katie at [facility]

## 2016-10-25 NOTE — Evaluation (Signed)
Occupational Therapy Evaluation Patient Details Name: Alicia Schroeder MRN: [id number] DOB: 1950/12/18 Today's Date: 10/25/2016    History of Present Illness Pt s/p R THR and with hx of L THR, L ankle fx, L collarbone fx    Clinical Impression   This 66 year old female was admitted for the above sx. She will benefit from continued OT to increase safety and independence with adls. Pt currently needs up to max A for LB dressing.  Goals are for supervision level in acute. Pt lives alone and will benefit from snf prior to home    Follow Up Recommendations  SNF    Equipment Recommendations  3 in 1 bedside commode ; if she doesn't have this   Recommendations for Other Services       Precautions / Restrictions Precautions Precautions: Fall Restrictions Other Position/Activity Restrictions: WBAT      Mobility Bed Mobility         Supine to sit: Min assist     General bed mobility comments: for RLE.  Painful during movement; unable to manage herself  Transfers   Equipment used: Rolling walker (2 wheeled)   Sit to Stand: Min guard         General transfer comment: cues for UE/LE placement    Balance                                           ADL either performed or assessed with clinical judgement   ADL Overall ADL's : Needs assistance/impaired     Grooming: Oral care;Supervision/safety;Standing   Upper Body Bathing: Set up;Sitting   Lower Body Bathing: Moderate assistance;Sit to/from stand   Upper Body Dressing : Minimal assistance;Sitting (to tie gown)   Lower Body Dressing: Maximal assistance;Sit to/from stand   Toilet Transfer: Min guard;Ambulation;BSC;RW   Toileting- Water quality scientist and Hygiene: Min guard;Sit to/from stand         General ADL Comments: ambulated to bathroom and completed ADL.  Pt would benefit from AE.  Pt has an essential tremor.  She stabilized on counter when brushing teeth     Vision          Perception     Praxis      Pertinent Vitals/Pain Pain Score: 5  Pain Location: R hip Pain Descriptors / Indicators: Aching;Sore Pain Intervention(s): Limited activity within patient's tolerance;Monitored during session;Premedicated before session;Repositioned;Ice applied     Hand Dominance Right   Extremity/Trunk Assessment Upper Extremity Assessment Upper Extremity Assessment: Overall WFL for tasks assessed (RUE intension tremor)           Communication Communication Communication: No difficulties   Cognition Arousal/Alertness: Awake/alert Behavior During Therapy: WFL for tasks assessed/performed Overall Cognitive Status: Within Functional Limits for tasks assessed                                     General Comments       Exercises     Shoulder Instructions      Home Living Family/patient expects to be discharged to:: Skilled nursing facility Living Arrangements: Alone                                      Prior Functioning/Environment Level of  Independence: Needs assistance  Gait / Transfers Assistance Needed: Pt using cane prior to surg              OT Problem List: Decreased strength;Decreased activity tolerance;Decreased knowledge of use of DME or AE;Pain      OT Treatment/Interventions: Self-care/ADL training;DME and/or AE instruction;Patient/family education    OT Goals(Current goals can be found in the care plan section) Acute Rehab OT Goals Patient Stated Goal: Regain IND OT Goal Formulation: With patient Time For Goal Achievement: 11/01/16 Potential to Achieve Goals: Good ADL Goals Pt Will Perform Lower Body Bathing: with supervision;with adaptive equipment;sit to/from stand Pt Will Perform Lower Body Dressing: with supervision;with adaptive equipment;sit to/from stand Pt Will Transfer to Toilet: with supervision;ambulating;bedside commode Pt Will Perform Toileting - Clothing Manipulation and hygiene: with  supervision;sit to/from stand Additional ADL Goal #1: ;pt will perform bed mobility at supervision level in preparation for adls  OT Frequency: Min 2X/week   Barriers to D/C:            Co-evaluation              AM-PAC PT "6 Clicks" Daily Activity     Outcome Measure Help from another person eating meals?: None Help from another person taking care of personal grooming?: A Little Help from another person toileting, which includes using toliet, bedpan, or urinal?: A Little Help from another person bathing (including washing, rinsing, drying)?: A Lot Help from another person to put on and taking off regular upper body clothing?: A Little Help from another person to put on and taking off regular lower body clothing?: A Lot 6 Click Score: 17   End of Session    Activity Tolerance: Patient tolerated treatment well Patient left: in chair;with call bell/phone within reach  OT Visit Diagnosis: Pain Pain - Right/Left: Right Pain - part of body: Hip                Time: 6389-3734 OT Time Calculation (min): 22 min Charges:  OT General Charges $OT Visit: 1 Procedure OT Evaluation $OT Eval Low Complexity: 1 Procedure G-Codes:     [place], OTR/L [phone] 10/25/2016  Galaxy Borden 10/25/2016, 9:28 AM

## 2016-10-25 NOTE — Progress Notes (Signed)
Physical Therapy Treatment Patient Details Name: Alicia Schroeder MRN: [id number] DOB: 04-02-1951 Today's Date: 10/25/2016    History of Present Illness Pt s/p R THR and with hx of L THR, L ankle fx, L collarbone fx     PT Comments    Pt motivated and progressing well this am despite increased pain and stiffness.  Pt eager for progression to rehab setting.   Follow Up Recommendations  SNF     Equipment Recommendations  Rolling walker with 5" wheels    Recommendations for Other Services OT consult     Precautions / Restrictions Precautions Precautions: Fall Restrictions Weight Bearing Restrictions: No Other Position/Activity Restrictions: WBAT    Mobility  Bed Mobility               General bed mobility comments: NT - Pt up in chair and requests back to same  Transfers Overall transfer level: Needs assistance Equipment used: Rolling walker (2 wheeled) Transfers: Sit to/from Stand Sit to Stand: Min guard         General transfer comment: cues for UE/LE placement  Ambulation/Gait Ambulation/Gait assistance: Min assist;Min guard Ambulation Distance (Feet): 75 Feet (twice) Assistive device: Rolling walker (2 wheeled) Gait Pattern/deviations: Step-to pattern;Decreased step length - right;Decreased step length - left;Shuffle;Trunk flexed Gait velocity: decr Gait velocity interpretation: Below normal speed for age/gender General Gait Details: cues for sequence, posture, position from RW and ER on L   Stairs            Wheelchair Mobility    Modified Rankin (Stroke Patients Only)       Balance                                            Cognition Arousal/Alertness: Awake/alert Behavior During Therapy: WFL for tasks assessed/performed Overall Cognitive Status: Within Functional Limits for tasks assessed                                        Exercises Total Joint Exercises Ankle Circles/Pumps:  AROM;Both;Supine;20 reps Quad Sets: AROM;Both;Supine;10 reps Heel Slides: AAROM;Right;Supine;20 reps Hip ABduction/ADduction: AAROM;Right;Supine;15 reps;10 reps    General Comments        Pertinent Vitals/Pain Pain Assessment: 0-10 Pain Score: 5  Pain Location: R hip Pain Descriptors / Indicators: Aching;Sore Pain Intervention(s): Limited activity within patient's tolerance    Home Living                      Prior Function            PT Goals (current goals can now be found in the care plan section) Acute Rehab PT Goals Patient Stated Goal: Regain IND PT Goal Formulation: With patient Time For Goal Achievement: 10/30/16 Potential to Achieve Goals: Good Progress towards PT goals: Progressing toward goals    Frequency    7X/week      PT Plan Current plan remains appropriate    Co-evaluation              AM-PAC PT "6 Clicks" Daily Activity  Outcome Measure  Difficulty turning over in bed (including adjusting bedclothes, sheets and blankets)?: A Little Difficulty moving from lying on back to sitting on the side of the bed? : A Little Difficulty sitting down on and standing  up from a chair with arms (e.g., wheelchair, bedside commode, etc,.)?: A Little Help needed moving to and from a bed to chair (including a wheelchair)?: A Little Help needed walking in hospital room?: A Little Help needed climbing 3-5 steps with a railing? : A Little 6 Click Score: 18    End of Session Equipment Utilized During Treatment: Gait belt Activity Tolerance: Patient tolerated treatment well Patient left: in chair;with call bell/phone within reach Nurse Communication: Mobility status PT Visit Diagnosis: Difficulty in walking, not elsewhere classified (R26.2)     Time: 0712-1975 PT Time Calculation (min) (ACUTE ONLY): 29 min  Charges:  $Gait Training: 8-22 mins $Therapeutic Exercise: 8-22 mins                    G Codes:         Davarion Cuffee 10/25/2016,  2:04 PM

## 2016-10-25 NOTE — Clinical Social Work Placement (Addendum)
   CLINICAL SOCIAL WORK PLACEMENT  NOTE  Date:  10/25/2016  Patient Details  Name: Alicia Schroeder MRN: [id number] Date of Birth: Mar 25, 1951  Clinical Social Work is seeking post-discharge placement for this patient at the [place] level of care (*CSW will initial, date and re-position this form in  chart as items are completed):      Patient/family provided with [facility] Work Department's list of facilities offering this level of care within the geographic area requested by the patient (or if unable, by the patient's family).  Yes   Patient/family informed of their freedom to choose among providers that offer the needed level of care, that participate in Medicare, Medicaid or managed care program needed by the patient, have an available bed and are willing to accept the patient.  Yes   Patient/family informed of [facility]'s ownership interest in [facility] and [facility], as well as of the fact that they are under no obligation to receive care at these facilities.  PASRR submitted to EDS on 10/24/16     PASRR number received on 10/24/16     Existing PASRR number confirmed on       FL2 transmitted to all facilities in geographic area requested by pt/family on 10/24/16     FL2 transmitted to all facilities within larger geographic area on       Patient informed that his/her managed care company has contracts with or will negotiate with certain facilities, including the following:        Yes   Patient/family informed of bed offers received.  Patient chooses bed at [facility] recommends and patient chooses bed at      Patient to be transferred to [facility] [place] on 10/25/16.  Patient to be transferred to facility by FRIEND     Patient family notified on 10/25/16 of transfer.  Name of family member notified:  FRIEND     PHYSICIAN       Additional Comment: Pt in agreement with d/c to [facility]  today. [facility] has provided authorization for placement. D/C Summary sent to facility for review. Scripts included in [employer]. # for report provided to nsg. D/C packet provided to pt.   _______________________________________________ Luretha Rued, LCSW  [phone] 10/25/2016, 3:06 PM

## 2016-10-28 DIAGNOSIS — K5900 Constipation, unspecified: Secondary | ICD-10-CM | POA: Diagnosis not present

## 2016-10-28 DIAGNOSIS — I10 Essential (primary) hypertension: Secondary | ICD-10-CM | POA: Diagnosis not present

## 2016-10-28 DIAGNOSIS — Z471 Aftercare following joint replacement surgery: Secondary | ICD-10-CM | POA: Diagnosis not present

## 2016-10-28 DIAGNOSIS — E785 Hyperlipidemia, unspecified: Secondary | ICD-10-CM | POA: Diagnosis not present

## 2016-10-28 DIAGNOSIS — M1611 Unilateral primary osteoarthritis, right hip: Secondary | ICD-10-CM | POA: Diagnosis not present

## 2016-11-06 DIAGNOSIS — Z471 Aftercare following joint replacement surgery: Secondary | ICD-10-CM | POA: Diagnosis not present

## 2016-11-06 DIAGNOSIS — E785 Hyperlipidemia, unspecified: Secondary | ICD-10-CM | POA: Diagnosis not present

## 2016-11-06 DIAGNOSIS — I10 Essential (primary) hypertension: Secondary | ICD-10-CM | POA: Diagnosis not present

## 2016-11-06 DIAGNOSIS — Z9181 History of falling: Secondary | ICD-10-CM | POA: Diagnosis not present

## 2016-11-06 DIAGNOSIS — Z96641 Presence of right artificial hip joint: Secondary | ICD-10-CM | POA: Diagnosis not present

## 2016-11-06 DIAGNOSIS — I358 Other nonrheumatic aortic valve disorders: Secondary | ICD-10-CM | POA: Diagnosis not present

## 2016-11-07 DIAGNOSIS — Z96641 Presence of right artificial hip joint: Secondary | ICD-10-CM | POA: Diagnosis not present

## 2016-11-07 DIAGNOSIS — I358 Other nonrheumatic aortic valve disorders: Secondary | ICD-10-CM | POA: Diagnosis not present

## 2016-11-07 DIAGNOSIS — I10 Essential (primary) hypertension: Secondary | ICD-10-CM | POA: Diagnosis not present

## 2016-11-07 DIAGNOSIS — Z471 Aftercare following joint replacement surgery: Secondary | ICD-10-CM | POA: Diagnosis not present

## 2016-11-07 DIAGNOSIS — Z9181 History of falling: Secondary | ICD-10-CM | POA: Diagnosis not present

## 2016-11-07 DIAGNOSIS — E785 Hyperlipidemia, unspecified: Secondary | ICD-10-CM | POA: Diagnosis not present

## 2016-11-08 DIAGNOSIS — I358 Other nonrheumatic aortic valve disorders: Secondary | ICD-10-CM | POA: Diagnosis not present

## 2016-11-08 DIAGNOSIS — Z96641 Presence of right artificial hip joint: Secondary | ICD-10-CM | POA: Diagnosis not present

## 2016-11-08 DIAGNOSIS — Z471 Aftercare following joint replacement surgery: Secondary | ICD-10-CM | POA: Diagnosis not present

## 2016-11-08 DIAGNOSIS — Z9181 History of falling: Secondary | ICD-10-CM | POA: Diagnosis not present

## 2016-11-08 DIAGNOSIS — I10 Essential (primary) hypertension: Secondary | ICD-10-CM | POA: Diagnosis not present

## 2016-11-08 DIAGNOSIS — E785 Hyperlipidemia, unspecified: Secondary | ICD-10-CM | POA: Diagnosis not present

## 2016-11-12 DIAGNOSIS — Z471 Aftercare following joint replacement surgery: Secondary | ICD-10-CM | POA: Diagnosis not present

## 2016-11-12 DIAGNOSIS — Z96641 Presence of right artificial hip joint: Secondary | ICD-10-CM | POA: Diagnosis not present

## 2016-11-12 DIAGNOSIS — I358 Other nonrheumatic aortic valve disorders: Secondary | ICD-10-CM | POA: Diagnosis not present

## 2016-11-12 DIAGNOSIS — Z9181 History of falling: Secondary | ICD-10-CM | POA: Diagnosis not present

## 2016-11-12 DIAGNOSIS — I10 Essential (primary) hypertension: Secondary | ICD-10-CM | POA: Diagnosis not present

## 2016-11-12 DIAGNOSIS — E785 Hyperlipidemia, unspecified: Secondary | ICD-10-CM | POA: Diagnosis not present

## 2016-11-14 DIAGNOSIS — E785 Hyperlipidemia, unspecified: Secondary | ICD-10-CM | POA: Diagnosis not present

## 2016-11-14 DIAGNOSIS — I10 Essential (primary) hypertension: Secondary | ICD-10-CM | POA: Diagnosis not present

## 2016-11-14 DIAGNOSIS — I358 Other nonrheumatic aortic valve disorders: Secondary | ICD-10-CM | POA: Diagnosis not present

## 2016-11-14 DIAGNOSIS — Z96641 Presence of right artificial hip joint: Secondary | ICD-10-CM | POA: Diagnosis not present

## 2016-11-14 DIAGNOSIS — Z471 Aftercare following joint replacement surgery: Secondary | ICD-10-CM | POA: Diagnosis not present

## 2016-11-14 DIAGNOSIS — Z9181 History of falling: Secondary | ICD-10-CM | POA: Diagnosis not present

## 2016-11-15 DIAGNOSIS — I10 Essential (primary) hypertension: Secondary | ICD-10-CM | POA: Diagnosis not present

## 2016-11-15 DIAGNOSIS — I358 Other nonrheumatic aortic valve disorders: Secondary | ICD-10-CM | POA: Diagnosis not present

## 2016-11-15 DIAGNOSIS — E785 Hyperlipidemia, unspecified: Secondary | ICD-10-CM | POA: Diagnosis not present

## 2016-11-15 DIAGNOSIS — Z96641 Presence of right artificial hip joint: Secondary | ICD-10-CM | POA: Diagnosis not present

## 2016-11-15 DIAGNOSIS — Z9181 History of falling: Secondary | ICD-10-CM | POA: Diagnosis not present

## 2016-11-15 DIAGNOSIS — Z471 Aftercare following joint replacement surgery: Secondary | ICD-10-CM | POA: Diagnosis not present

## 2016-11-19 DIAGNOSIS — Z96649 Presence of unspecified artificial hip joint: Secondary | ICD-10-CM | POA: Diagnosis not present

## 2016-11-19 DIAGNOSIS — Z471 Aftercare following joint replacement surgery: Secondary | ICD-10-CM | POA: Diagnosis not present

## 2016-11-19 DIAGNOSIS — Z9181 History of falling: Secondary | ICD-10-CM | POA: Diagnosis not present

## 2016-11-19 DIAGNOSIS — E785 Hyperlipidemia, unspecified: Secondary | ICD-10-CM | POA: Diagnosis not present

## 2016-11-19 DIAGNOSIS — I358 Other nonrheumatic aortic valve disorders: Secondary | ICD-10-CM | POA: Diagnosis not present

## 2016-11-19 DIAGNOSIS — I10 Essential (primary) hypertension: Secondary | ICD-10-CM | POA: Diagnosis not present

## 2016-11-19 DIAGNOSIS — Z96641 Presence of right artificial hip joint: Secondary | ICD-10-CM | POA: Diagnosis not present

## 2016-11-22 DIAGNOSIS — I10 Essential (primary) hypertension: Secondary | ICD-10-CM | POA: Diagnosis not present

## 2016-11-22 DIAGNOSIS — Z9181 History of falling: Secondary | ICD-10-CM | POA: Diagnosis not present

## 2016-11-22 DIAGNOSIS — Z96641 Presence of right artificial hip joint: Secondary | ICD-10-CM | POA: Diagnosis not present

## 2016-11-22 DIAGNOSIS — I358 Other nonrheumatic aortic valve disorders: Secondary | ICD-10-CM | POA: Diagnosis not present

## 2016-11-22 DIAGNOSIS — Z471 Aftercare following joint replacement surgery: Secondary | ICD-10-CM | POA: Diagnosis not present

## 2016-11-22 DIAGNOSIS — E785 Hyperlipidemia, unspecified: Secondary | ICD-10-CM | POA: Diagnosis not present

## 2016-11-25 DIAGNOSIS — Z96641 Presence of right artificial hip joint: Secondary | ICD-10-CM | POA: Diagnosis not present

## 2016-11-25 DIAGNOSIS — Z9181 History of falling: Secondary | ICD-10-CM | POA: Diagnosis not present

## 2016-11-25 DIAGNOSIS — I10 Essential (primary) hypertension: Secondary | ICD-10-CM | POA: Diagnosis not present

## 2016-11-25 DIAGNOSIS — Z471 Aftercare following joint replacement surgery: Secondary | ICD-10-CM | POA: Diagnosis not present

## 2016-11-25 DIAGNOSIS — E785 Hyperlipidemia, unspecified: Secondary | ICD-10-CM | POA: Diagnosis not present

## 2016-11-25 DIAGNOSIS — I358 Other nonrheumatic aortic valve disorders: Secondary | ICD-10-CM | POA: Diagnosis not present

## 2016-11-26 DIAGNOSIS — Z96641 Presence of right artificial hip joint: Secondary | ICD-10-CM | POA: Diagnosis not present

## 2016-11-26 DIAGNOSIS — Z471 Aftercare following joint replacement surgery: Secondary | ICD-10-CM | POA: Diagnosis not present

## 2016-12-17 DIAGNOSIS — Z7409 Other reduced mobility: Secondary | ICD-10-CM | POA: Diagnosis not present

## 2016-12-24 DIAGNOSIS — Z7409 Other reduced mobility: Secondary | ICD-10-CM | POA: Diagnosis not present

## 2016-12-27 DIAGNOSIS — Z7409 Other reduced mobility: Secondary | ICD-10-CM | POA: Diagnosis not present

## 2016-12-31 DIAGNOSIS — Z7409 Other reduced mobility: Secondary | ICD-10-CM | POA: Diagnosis not present

## 2017-01-03 DIAGNOSIS — Z7409 Other reduced mobility: Secondary | ICD-10-CM | POA: Diagnosis not present

## 2017-01-14 DIAGNOSIS — Z7409 Other reduced mobility: Secondary | ICD-10-CM | POA: Diagnosis not present

## 2017-01-15 DIAGNOSIS — M48061 Spinal stenosis, lumbar region without neurogenic claudication: Secondary | ICD-10-CM | POA: Diagnosis not present

## 2017-01-15 DIAGNOSIS — M5136 Other intervertebral disc degeneration, lumbar region: Secondary | ICD-10-CM | POA: Diagnosis not present

## 2017-01-15 DIAGNOSIS — M1611 Unilateral primary osteoarthritis, right hip: Secondary | ICD-10-CM | POA: Diagnosis not present

## 2017-01-17 DIAGNOSIS — Z7409 Other reduced mobility: Secondary | ICD-10-CM | POA: Diagnosis not present

## 2017-01-29 DIAGNOSIS — M5137 Other intervertebral disc degeneration, lumbosacral region: Secondary | ICD-10-CM | POA: Diagnosis not present

## 2017-01-29 DIAGNOSIS — M545 Low back pain: Secondary | ICD-10-CM | POA: Diagnosis not present

## 2017-01-31 ENCOUNTER — Ambulatory Visit: Payer: Medicare HMO | Admitting: Internal Medicine

## 2017-02-05 ENCOUNTER — Encounter: Payer: Self-pay | Admitting: Internal Medicine

## 2017-02-18 DIAGNOSIS — G8929 Other chronic pain: Secondary | ICD-10-CM | POA: Diagnosis not present

## 2017-02-18 DIAGNOSIS — M545 Low back pain: Secondary | ICD-10-CM | POA: Diagnosis not present

## 2017-02-20 ENCOUNTER — Ambulatory Visit: Payer: Medicare HMO | Admitting: Cardiology

## 2017-02-21 DIAGNOSIS — M545 Low back pain: Secondary | ICD-10-CM | POA: Diagnosis not present

## 2017-02-21 DIAGNOSIS — G8929 Other chronic pain: Secondary | ICD-10-CM | POA: Diagnosis not present

## 2017-02-25 DIAGNOSIS — G8929 Other chronic pain: Secondary | ICD-10-CM | POA: Diagnosis not present

## 2017-02-25 DIAGNOSIS — M545 Low back pain: Secondary | ICD-10-CM | POA: Diagnosis not present

## 2017-03-03 DIAGNOSIS — M545 Low back pain: Secondary | ICD-10-CM | POA: Diagnosis not present

## 2017-03-03 DIAGNOSIS — G8929 Other chronic pain: Secondary | ICD-10-CM | POA: Diagnosis not present

## 2017-03-05 DIAGNOSIS — G8929 Other chronic pain: Secondary | ICD-10-CM | POA: Diagnosis not present

## 2017-03-05 DIAGNOSIS — M545 Low back pain: Secondary | ICD-10-CM | POA: Diagnosis not present

## 2017-03-06 ENCOUNTER — Ambulatory Visit: Payer: Medicare HMO | Admitting: Cardiology

## 2017-03-07 ENCOUNTER — Encounter: Payer: Self-pay | Admitting: Internal Medicine

## 2017-03-07 ENCOUNTER — Ambulatory Visit (INDEPENDENT_AMBULATORY_CARE_PROVIDER_SITE_OTHER): Payer: Medicare HMO | Admitting: Internal Medicine

## 2017-03-07 VITALS — BP 130/84 | HR 70 | Wt 234.0 lb

## 2017-03-07 DIAGNOSIS — Z23 Encounter for immunization: Secondary | ICD-10-CM | POA: Diagnosis not present

## 2017-03-07 DIAGNOSIS — M8580 Other specified disorders of bone density and structure, unspecified site: Secondary | ICD-10-CM | POA: Diagnosis not present

## 2017-03-07 DIAGNOSIS — E559 Vitamin D deficiency, unspecified: Secondary | ICD-10-CM | POA: Diagnosis not present

## 2017-03-07 DIAGNOSIS — G8929 Other chronic pain: Secondary | ICD-10-CM | POA: Diagnosis not present

## 2017-03-07 DIAGNOSIS — E210 Primary hyperparathyroidism: Secondary | ICD-10-CM

## 2017-03-07 DIAGNOSIS — M545 Low back pain: Secondary | ICD-10-CM | POA: Diagnosis not present

## 2017-03-07 NOTE — Progress Notes (Addendum)
Patient ID: Alicia Schroeder, female   DOB: Nov 29, 1950, 66 y.o.   MRN: [id number]   HPI  Alicia Schroeder is a 66 y.o.-year-old female, initially referred by her PCP, Dr. Stephanie Acre, returning for follow-up for Hypercalcemia, inadequately suppressed PTH, vitamin D deficiency, and osteopenia (Op). Last visit 7 months ago.  Pt was dx with Osteopenia in 06/2015.  Reviewed and addended history: I reviewed pt's DEXA scans: Date L1-L4 T score FN T score Ultra distal radius  33% distal Radius FRAX score   07/04/2015  +2.0  RFN: -1.0 L: -1.1 L: -0.9  MOF: 14%  Hip fracture risk: 0.5%   Has L THR and a R TKR   She had: - Left ankle trimalleolar fx - after falling through a rotten deck (11/2014) >> had surgery - clavicle fracture - after pushing a walker (12/15/2014) She also lost more than 4 cm in height from her maximum height.  No dizziness/vertigo/orthostasis/poor vision. She has some imbalance from her fx 'ed ankle >> fell recently >> did not have a fracture.  She has not been on any OP treatments in the past.  No weight bearing exercises.   She does not take high vitamin A doses.  No h/o kidney stones.  She had several courses of steroid in the past, not recently.   Menopause was at 66 y/o.   Pt does have a FH of osteoporosis - hip fx in mother.  She also has vitamin D insufficiency  - she was on different doses of vitamin D supplements in the past, currently 10,000 units 6/7 days and 20,000 units 1/7 days - reviewed her vitamin D levels Lab Results  Component Value Date   VD25OH 39.96 07/31/2016   VD25OH 27.95 (L) 04/02/2016   VD25OH 27.19 (L) 01/29/2016   VD25OH 28.15 (L) 11/22/2015   VD25OH 20.62 (L) 09/20/2015   30.3 on 06/05/2015 23.9 in 04/2014 She continues on [place] fish oil to improve the absorption of the vitamin D.  She still drinks almond milk (1.5 cups a day >> ~650 mg Ca).   She has a history of slight hypercalcemia without hyperparathyroidism,but with an  unsuppressed PTH:  10/25/2016: Ca 9.9 I received labs from PCP from 10/01/2016:  HbA1c 6.1%  CBC normal, exc. Slightly low RBC  CMP normal, except glucose 113. Calcium was high at 11.2, but corrected 10.65 (8.6-10.3), BUN/creatinine 15/0.62, eGFR 96.  TSH 2.11  I received labs from PCP from 06/20/2016:  HbA1c 6.2%  CBC normal  CMP normal, except glucose 116. Calcium was high at 10.8, but corrected 10.32 (8.6-10.3), BUN/creatinine 17/0.68, eGFR 87.  TSH 2.32  Lipids: 161/82/78/66  Lab Results  Component Value Date   PTH 30 07/31/2016   PTH Comment 07/31/2016   PTH 48 01/29/2016   PTH Comment 01/29/2016   PTH 42 12/01/2015   CALCIUM 9.9 10/25/2016   CALCIUM 9.8 10/24/2016   CALCIUM 10.8 (H) 10/17/2016   CALCIUM 10.4 (H) 07/31/2016   CALCIUM 10.6 (H) 01/29/2016   CALCIUM 10.6 (H) 12/01/2015   CALCIUM 10.8 (H) 12/26/2014   CALCIUM 10.1 10/01/2013   CALCIUM 9.9 09/30/2013   CALCIUM 10.7 (H) 09/22/2013  06/12/2015: Intact PTH 27 (15-65)-[facility]; calcium 10.5 (8.6-10.3)   We checked several labs which pointed towards possible mild primary hyperparathyroidism: - Multiple myeloma workup was negative - Magnesium was normal - 1, 25 dihydroxy vitamin D was normal -  24-hour urinary calcium was 231 (35-250 mg per 24-hour), with adequate creatinine   No h/o thyrotoxicosis. Reviewed TSH recent  levels:  All normal, please see above + 06/05/2015: TSH 2.95 05/05/2014: TSH 2.33  No h/o CKD. Last BUN/Cr: Lab Results  Component Value Date   BUN 9 10/25/2016   CREATININE 0.45 10/25/2016   At last visit, I referred her to Dr. Harlow Asa for a second opinion about her mild primary hyperparathyroidism. She had a visit with him on 09/10/2016. The following labs were checked - I reviewed the reports along with the patient today:  NM technetium parathyroid scan (09/24/2016): negative for adenoma Thyroid ultrasound (10/01/16): One small, 0.9 cm thyroid nodule in the left lobe, but no  sign of parathyroid adenoma 4D CT (10/18/2016): negative for convincing parathyroid adenoma, but possible inconspicuous 1 cm oval soft tissue caudal to the left lobe which does reflect parathyroid tissue but is probably physiologic  She had an appt Dr Harlow Asa >> suggested only f/u, but no intervention for now.  ROS: Constitutional: no weight gain/+ weight loss, no fatigue, no subjective hyperthermia, no subjective hypothermia Eyes: no blurry vision, no xerophthalmia ENT: no sore throat, no nodules palpated in throat, no dysphagia, no odynophagia, no hoarseness Cardiovascular: no CP/no SOB/no palpitations/no leg swelling Respiratory: no cough/no SOB/no wheezing Gastrointestinal: no N/no V/no D/no C/no acid reflux Musculoskeletal: no muscle aches/+ joint aches Skin: no rashes, no hair loss Neurological: no tremors/no numbness/no tingling/no dizziness  I reviewed pt's medications, allergies, PMH, social hx, family hx, and changes were documented in the history of present illness. Otherwise, unchanged from my initial visit note. Started Turmeric.  Past Medical History:  Diagnosis Date  . Abnormal ECG   . Aortic valve sclerosis    no evidence per patient   . Arthritis     right greater the left hip  . Cancer ([place])    basal cell carcinoma removed- 24 years ago   . Depression    no meds  . Dysrhythmia    has soft Systolic Ejection Murmur  . Family history of anesthesia complication    sisterin law disabled from anesthesia   . Family history of premature coronary artery disease    Father with extensive coronary and carotid disease  . Hyperlipidemia   . Hypertension   . Pneumonia    15 years ago approximately   . Premature ventricular contractions    Past Surgical History:  Procedure Laterality Date  . ANKLE FRACTURE SURGERY Left 12-09-14  . APPLICATION OF A-CELL OF EXTREMITY Left 03/27/2015   Procedure: APPLICATION OF A-CELL OF EXTREMITY;  Surgeon: Wallace Going, DO;   Location: [facility];  Service: Plastics;  Laterality: Left;  . I&D EXTREMITY Left 03/27/2015   Procedure: IRRIGATION AND DEBRIDEMENT LEFT ANKLE ;  Surgeon: Wallace Going, DO;  Location: [facility];  Service: Plastics;  Laterality: Left;  . JOINT REPLACEMENT Left   . NM LEXISCAN MYOVIEW LTD  08/03/2013   Normal study. No ischemia or infarction. Normal wall motion. EF 71%  . TOTAL HIP ARTHROPLASTY Left 09/29/2013   Procedure: LEFT TOTAL HIP ARTHROPLASTY ANTERIOR APPROACH;  Surgeon: Gearlean Alf, MD;  Location: [facility];  Service: Orthopedics;  Laterality: Left;  . TOTAL HIP ARTHROPLASTY Right 10/23/2016   Procedure: RIGHT TOTAL HIP ARTHROPLASTY ANTERIOR APPROACH;  Surgeon: Gaynelle Arabian, MD;  Location: [facility];  Service: Orthopedics;  Laterality: Right;  . TRANSTHORACIC ECHOCARDIOGRAM  08/05/2013   Normal LV size and function. EF 60-65%. No regional WMA; grade 1 diastolic dysfunction with mild LA dilation; no significant valvular lesions -- mild aortic valve calcification/sclerosis (  could explain a soft systolic murmur)   Social History   Social History  . Marital Status: Divorced    Spouse Name: N/A  . Number of Children: 2 (7 miscarriages)    Social History Main Topics  . Smoking status: Never Smoker   . Smokeless tobacco: Never Used  . Alcohol Use: Yes     Comment: occasional   . Drug Use: No   Social History Narrative   She is a divorced mother of 2. She's been divorced for 10 years. She does long with her dog. 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    She's never smoked. Does not drink alcohol.      Phone [phone]   Children: Lexine Jaspers [place], [place] - [phone]); Loriann Bosserman [place], [place] [phone] since about.    Current Outpatient Prescriptions on File Prior to Visit  Medication Sig Dispense Refill  . acebutolol (SECTRAL) 200 MG capsule TAKE ONE CAPSULE BY MOUTH TWICE DAILY 180 capsule 1  .  acetaminophen (TYLENOL) 325 MG tablet Take 2 tablets (650 mg total) by mouth every 6 (six) hours as needed for mild pain (or Fever >/= 101). 20 tablet 0  . atorvastatin (LIPITOR) 20 MG tablet Take 20 mg by mouth daily at 6 PM. Patient takes in pm    . bisacodyl (DULCOLAX) 10 MG suppository Place 1 suppository (10 mg total) rectally daily as needed for moderate constipation. 12 suppository 0  . cetirizine (ZYRTEC) 10 MG tablet Take 10 mg by mouth every evening.     . docusate sodium (COLACE) 100 MG capsule Take 1 capsule (100 mg total) by mouth 2 (two) times daily. 30 capsule 0  . lisinopril (PRINIVIL,ZESTRIL) 20 MG tablet Take 20 mg by mouth every evening.     . Magnesium Oxide 400 MG CAPS Take 400 mg by mouth every morning.     . methocarbamol (ROBAXIN) 500 MG tablet Take 1 tablet (500 mg total) by mouth every 6 (six) hours as needed for muscle spasms. 80 tablet 0  . oxyCODONE (OXY IR/ROXICODONE) 5 MG immediate release tablet Take 1-2 tablets (5-10 mg total) by mouth every 4 (four) hours as needed for moderate pain or severe pain. 60 tablet 0  . polyethylene glycol (MIRALAX / GLYCOLAX) packet Take 17 g by mouth daily as needed for mild constipation. 14 each 0  . rivaroxaban (XARELTO) 10 MG TABS tablet Take 1 tablet (10 mg total) by mouth daily with breakfast. Take Xarelto for two and a half more weeks following discharge from the hospital, then discontinue Xarelto. Once the patient has completed the Xarelto, they may resume the 81 mg Aspirin. 19 tablet 0  . sodium phosphate (FLEET) 7-19 GM/118ML ENEM Place 133 mLs (1 enema total) rectally once as needed for severe constipation. 2 Bottle 0  . traMADol (ULTRAM) 50 MG tablet Take 1-2 tablets (50-100 mg total) by mouth every 6 (six) hours as needed for moderate pain. 56 tablet 0   No current facility-administered medications on file prior to visit.    Allergies  Allergen Reactions  . Adhesive [Tape] Rash    Bandaids  . Lidocaine     Pt had  lidocaine at dentist office 30 years ago .Marland Kitchen    Family History  Problem Relation Age of Onset  . Parkinson's disease Mother   . Thyroid disease Mother   . Hypertension Mother   . Heart failure Mother   . Hypertension Father   . Hyperlipidemia Father   .  Heart disease Father 76       Triple bypass.   . Kidney failure Father   . Stroke Maternal Grandmother   . Transient ischemic attack Maternal Grandmother   . Arrhythmia Maternal Grandfather        Pacemaker  . Heart disease Paternal Grandmother   . Parkinson's disease Paternal Grandfather   . Hypertension Paternal Grandfather     PE: BP 130/84 (BP Location: Left Arm, Patient Position: Sitting)   Pulse 70   Wt 234 lb (106.1 kg)   SpO2 96%   BMI 40.17 kg/m  Wt Readings from Last 3 Encounters:  03/07/17 234 lb (106.1 kg)  10/23/16 230 lb (104.3 kg)  10/17/16 230 lb 6 oz (104.5 kg)   Constitutional: obese, in NAD Eyes: PERRLA, EOMI, no exophthalmos ENT: moist mucous membranes, no thyromegaly, no cervical lymphadenopathy Cardiovascular: RRR, No MRG Respiratory: CTA B Gastrointestinal: abdomen soft, NT, ND, BS+ Musculoskeletal: no deformities, strength intact in all 4 Skin: moist, warm, no rashes Neurological: + mild tremor with outstretched hands, DTR normal in all 4  Assessment: 1. Osteopenia  2. Mild Primary Hyperparathyroidism  3. Vit D insuff.  Plan: 1. Osteopenia - Possibly related to early menopause (at 66 years old) + FH of OP - reviewed together her T scores from 2017 >> very slightly low, but she does have a h/o fractures >> at higher risk for more fractures. - we discussed about the impact of her mild hypercalcemia on her bones - I do not feel this is significant as she has mostly normal calcium levels and normal PTH - discussed about the importance of keeping a normal vit D level >> checked at last visit >> will recheck at next since it was normal - at a previous visit, we discussed about the different  medication classes, however, if needed after the next DEXA scan, can start with weekly Fosamax  2. Mild Primary Hyperparathyroidism and 3. Vit D insuff. - She had several calcium levels were slightly higher than the upper limit of normal + a PTH level normal, but higher than expected. She also has a history of vitamin D insufficiency, which may elevated the PTH, with latest level normal 7 mo ago. - We will check a vitamin D level,  PTH and Ca at next visit. Calcium from 10/2016 reviewed >> 9.9 (normal) - No apparent complications from hypercalcemia: no h/o nephrolithiasis, no osteoporosis, but she does have a h/o fxs - we again discussed about possible consequences of hyperparathyroidism: ~1/3 pts will develop complications over 15 years (OP, nephrolithiasis). Her calcium is either normal or minimally elevated, and with new results of the imaging tests reviewed together with pt >> no clear parathyroid adenoma >> will continue to manage her expectantly. - will continue her current vitamin D supplementation for now (10,000 units daily except + an extra 10,000 1/7 days). - I will see the patient back in 6 mo  Given flu shot today  Addendum: Received labs from Dr. Harlow Asa (drawn on 02/05/2017): - Ca 11 (8.7-10.3), PTH 29, vit D 49.5  Calcium higher than before, but w/o a localizing lesion on the parathyroid scans, we can just follow this for now.  Philemon Kingdom, MD PhD [facility] Endocrinology

## 2017-03-07 NOTE — Patient Instructions (Signed)
Please continue vitamin D 10,000 units 6/7 days, and 20,000 units 1/7 days.  Please come back for a follow-up appointment in 6 months.

## 2017-03-18 DIAGNOSIS — M545 Low back pain: Secondary | ICD-10-CM | POA: Diagnosis not present

## 2017-03-26 DIAGNOSIS — M545 Low back pain: Secondary | ICD-10-CM | POA: Diagnosis not present

## 2017-03-26 DIAGNOSIS — G8929 Other chronic pain: Secondary | ICD-10-CM | POA: Diagnosis not present

## 2017-03-28 ENCOUNTER — Ambulatory Visit (INDEPENDENT_AMBULATORY_CARE_PROVIDER_SITE_OTHER): Payer: Medicare HMO | Admitting: Cardiology

## 2017-03-28 ENCOUNTER — Encounter: Payer: Self-pay | Admitting: Cardiology

## 2017-03-28 VITALS — BP 126/70 | HR 74 | Ht 64.0 in | Wt 235.0 lb

## 2017-03-28 DIAGNOSIS — I10 Essential (primary) hypertension: Secondary | ICD-10-CM | POA: Diagnosis not present

## 2017-03-28 DIAGNOSIS — E669 Obesity, unspecified: Secondary | ICD-10-CM

## 2017-03-28 DIAGNOSIS — I493 Ventricular premature depolarization: Secondary | ICD-10-CM | POA: Diagnosis not present

## 2017-03-28 DIAGNOSIS — G8929 Other chronic pain: Secondary | ICD-10-CM | POA: Diagnosis not present

## 2017-03-28 DIAGNOSIS — M545 Low back pain: Secondary | ICD-10-CM | POA: Diagnosis not present

## 2017-03-28 NOTE — Patient Instructions (Signed)
No change with current medications     Your physician wants you to follow-up in 12 months with Dr HARDING.  You will receive a reminder letter in the mail two months in advance. If you don't receive a letter, please call our office to schedule the follow-up appointment.   If you need a refill on your cardiac medications before your next appointment, please call your pharmacy.  

## 2017-03-28 NOTE — Progress Notes (Signed)
PCP: Jonathon Jordan, MD  Clinic Note: Chief Complaint  Patient presents with  . Follow-up    Postop.  Also history of palpitations.    HPI: Alicia Schroeder is a 66 y.o. female who is being seen today for follow-up evaluation after initial preop evaluation consultation at the request of Jonathon Jordan, MD.  She has a strong family history of CAD and herself has hypertension hyperlipidemia.   [name] was last seen on Oct 09, 2016 doing fine and is noticing right hip pain that would again require replacement surgery.  She was also noticing some issues with thyroid nodule and hypercalciuria.  Stable from a cardiac standpoint.  Recent Hospitalizations:   October 23, 2016: Right total hip arthroplasty by Dr. Wynelle Link  Studies Personally Reviewed - (if available, images/films reviewed: From Epic Chart or Care Everywhere)  none  Interval History: Alicia Schroeder returns today overall doing fairly well.  She tells me that she has seen several people about her hypercalcemia, but no wheeze but I would really tell why it is there.  She is recovering from her recent hip surgery and is feeling great.  She is not having any active symptoms of rapid irregular heartbeat or palpitations.  Her palpitations have been well controlled since starting acebutolol.  She has not not had any significant cardiac symptoms. Cardiac review of symptoms as follows: No chest pain or shortness of breath with rest or exertion. No PND, orthopnea or edema. No palpitations, lightheadedness, dizziness, weakness or syncope/near syncope. No TIA/amaurosis fugax symptoms.  No claudication.  ROS: A comprehensive was performed. Review of Systems  HENT: Negative for congestion and nosebleeds.   Respiratory: Negative for cough and shortness of breath.   Gastrointestinal: Negative for blood in stool.  Genitourinary: Negative for hematuria.  Musculoskeletal:       Definitely improved with walking since her hip surgery.    Endo/Heme/Allergies: Negative for environmental allergies. Does not bruise/bleed easily.  Psychiatric/Behavioral: Negative for memory loss. The patient is not nervous/anxious and does not have insomnia.    I have reviewed and (if needed) personally updated the patient's problem list, medications, allergies, past medical and surgical history, social and family history.   Past Medical History:  Diagnosis Date  . Abnormal ECG   . Aortic valve sclerosis    no evidence per patient   . Arthritis     right greater the left hip  . Cancer ([place])    basal cell carcinoma removed- 24 years ago   . Depression    no meds  . Dysrhythmia    has soft Systolic Ejection Murmur  . Family history of anesthesia complication    sisterin law disabled from anesthesia   . Family history of premature coronary artery disease    Father with extensive coronary and carotid disease  . Hyperlipidemia   . Hypertension   . Pneumonia    15 years ago approximately   . Premature ventricular contractions     Past Surgical History:  Procedure Laterality Date  . ANKLE FRACTURE SURGERY Left 12-09-14  . JOINT REPLACEMENT Left   . NM LEXISCAN MYOVIEW LTD  08/03/2013   Normal study. No ischemia or infarction. Normal wall motion. EF 71%  . TRANSTHORACIC ECHOCARDIOGRAM  08/05/2013   Normal LV size and function. EF 60-65%. No regional WMA; grade 1 diastolic dysfunction with mild LA dilation; no significant valvular lesions -- mild aortic valve calcification/sclerosis (could explain a soft systolic murmur)    Current Meds  Medication Sig  .  acebutolol (SECTRAL) 200 MG capsule TAKE ONE CAPSULE BY MOUTH TWICE DAILY  . acetaminophen (TYLENOL) 325 MG tablet Take 2 tablets (650 mg total) by mouth every 6 (six) hours as needed for mild pain (or Fever >/= 101).  Marland Kitchen aspirin EC 81 MG tablet Take 81 mg by mouth daily.  Marland Kitchen atorvastatin (LIPITOR) 20 MG tablet Take 20 mg by mouth daily at 6 PM. Patient takes in pm  . cetirizine  (ZYRTEC) 10 MG tablet Take 10 mg by mouth every evening.   . Cholecalciferol (VITAMIN D3) 10000 units TABS Take by mouth.  Marland Kitchen lisinopril (PRINIVIL,ZESTRIL) 20 MG tablet Take 20 mg by mouth every evening.   . Magnesium Oxide 400 MG CAPS Take 400 mg by mouth every morning.   . Misc Natural Products (OSTEO BI-FLEX TRIPLE STRENGTH) TABS Take by mouth.  . Omega 3 1200 MG CAPS Take by mouth.  . Turmeric 500 MG CAPS Take by mouth.    Allergies  Allergen Reactions  . Adhesive [Tape] Rash    Bandaids  . Lidocaine     Pt had lidocaine at dentist office 30 years ago .Marland Kitchen     Social History   Socioeconomic History  . Marital status: Divorced    Spouse name: None  . Number of children: None  . Years of education: None  . Highest education level: None  Social Needs  . Financial resource strain: None  . Food insecurity - worry: None  . Food insecurity - inability: None  . Transportation needs - medical: None  . Transportation needs - non-medical: None  Occupational History  . None  Tobacco Use  . Smoking status: Never Smoker  . Smokeless tobacco: Never Used  Substance and Sexual Activity  . Alcohol use: Yes    Comment: occasional   . Drug use: No  . Sexual activity: None  Other Topics Concern  . None  Social History Narrative   She is a divorced mother of 2. She's been divorced for 10 years. She does long with her dog. 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    She's never smoked. Does not drink alcohol.    she does daily exercises for her hips 7 nasal week for 20 minutes at a time. This is mostly stretching and. Because of her hip pain she is no longer able to walk like she used to.    Phone [phone]   Children: Alicia Schroeder, [place] - [phone]); Alicia Schroeder, [place] [phone] since about.     family history includes Arrhythmia in her maternal grandfather; Heart disease in her paternal grandmother; Heart disease (age of onset:  80) in her father; Heart failure in her mother; Hyperlipidemia in her father; Hypertension in her father, mother, and paternal grandfather; Kidney failure in her father; Parkinson's disease in her mother and paternal grandfather; Stroke in her maternal grandmother; Thyroid disease in her mother; Transient ischemic attack in her maternal grandmother.  Wt Readings from Last 3 Encounters:  03/28/17 235 lb (106.6 kg)  03/07/17 234 lb (106.1 kg)  10/23/16 230 lb (104.3 kg)    PHYSICAL EXAM BP 126/70   Pulse 74   Ht 5\' 4"  (1.626 m)   Wt 235 lb (106.6 kg)   SpO2 96%   BMI 40.34 kg/m  Physical Exam  Constitutional: She is oriented to person, place, and time. She appears well-developed and well-nourished. No distress.  Moderate-morbidly obese.  Well-groomed  HENT:  Head: Normocephalic and atraumatic.  Neck: No hepatojugular reflux and no JVD present. Carotid bruit is not present.  Cardiovascular: Normal rate, regular rhythm, normal heart sounds and intact distal pulses. Exam reveals no friction rub.  No murmur heard. Pulmonary/Chest: Effort normal and breath sounds normal. No respiratory distress. She has no wheezes.  Abdominal: Soft. Bowel sounds are normal. She exhibits no distension. There is no tenderness. There is no rebound.  Musculoskeletal: Normal range of motion. She exhibits no edema.  Neurological: She is alert and oriented to person, place, and time.  Skin: Skin is warm and dry.  Psychiatric: She has a normal mood and affect. Her behavior is normal. Judgment and thought content normal.  Nursing note and vitals reviewed.   Adult ECG Report Not checked   Other studies Reviewed: Additional studies/ records that were reviewed today include:  Recent Labs: PCP (total cholesterol under 200) No results found for: CHOL, HDL, LDLCALC, LDLDIRECT, TRIG, CHOLHDL   ASSESSMENT / PLAN: Problem List Items Addressed This Visit    Essential hypertension (Chronic)    Well-controlled on  lisinopril and acebutolol.      Obesity (BMI 30-39.9) (Chronic)    Hoping to lose back from the weight she gained now that she has had her hip surgery.  She started to get back active and exercise.      PVC's (premature ventricular contractions) - Primary (Chronic)    Well-controlled since starting acebutolol.  She is also done a good job of Cutting back caffeine intake, and staying adequately hydrated.         Current medicines are reviewed at length with the patient today. (+/- concerns) none The following changes have been made:None  Patient Instructions  No change with current medications   Your physician wants you to follow-up in 12 months with Dr Ellyn Hack. You will receive a reminder letter in the mail two months in advance. If you don't receive a letter, please call our office to schedule the follow-up appointment.    If you need a refill on your cardiac medications before your next appointment, please call your pharmacy.    Studies Ordered:   No orders of the defined types were placed in this encounter.     Glenetta Hew, M.D., M.S. Interventional Cardiologist   Pager # [phone] Phone # [phone] [address] [place], [place] [address]

## 2017-03-30 ENCOUNTER — Encounter: Payer: Self-pay | Admitting: Cardiology

## 2017-03-30 NOTE — Assessment & Plan Note (Signed)
Well-controlled since starting acebutolol.  She is also done a good job of Cutting back caffeine intake, and staying adequately hydrated.

## 2017-03-30 NOTE — Assessment & Plan Note (Signed)
Well-controlled on lisinopril and acebutolol.

## 2017-03-30 NOTE — Assessment & Plan Note (Signed)
Hoping to lose back from the weight she gained now that she has had her hip surgery.  She started to get back active and exercise.

## 2017-03-31 DIAGNOSIS — M50323 Other cervical disc degeneration at C6-C7 level: Secondary | ICD-10-CM | POA: Diagnosis not present

## 2017-03-31 DIAGNOSIS — M50322 Other cervical disc degeneration at C5-C6 level: Secondary | ICD-10-CM | POA: Diagnosis not present

## 2017-03-31 DIAGNOSIS — M542 Cervicalgia: Secondary | ICD-10-CM | POA: Diagnosis not present

## 2017-04-01 DIAGNOSIS — M545 Low back pain: Secondary | ICD-10-CM | POA: Diagnosis not present

## 2017-04-01 DIAGNOSIS — G8929 Other chronic pain: Secondary | ICD-10-CM | POA: Diagnosis not present

## 2017-04-04 DIAGNOSIS — G8929 Other chronic pain: Secondary | ICD-10-CM | POA: Diagnosis not present

## 2017-04-04 DIAGNOSIS — M545 Low back pain: Secondary | ICD-10-CM | POA: Diagnosis not present

## 2017-04-07 DIAGNOSIS — M542 Cervicalgia: Secondary | ICD-10-CM | POA: Diagnosis not present

## 2017-04-09 DIAGNOSIS — M542 Cervicalgia: Secondary | ICD-10-CM | POA: Diagnosis not present

## 2017-04-12 ENCOUNTER — Other Ambulatory Visit: Payer: Self-pay | Admitting: Cardiology

## 2017-04-15 DIAGNOSIS — M542 Cervicalgia: Secondary | ICD-10-CM | POA: Diagnosis not present

## 2017-04-15 NOTE — Telephone Encounter (Signed)
REFILL 

## 2017-04-18 DIAGNOSIS — M542 Cervicalgia: Secondary | ICD-10-CM | POA: Diagnosis not present

## 2017-05-01 DIAGNOSIS — M542 Cervicalgia: Secondary | ICD-10-CM | POA: Diagnosis not present

## 2017-05-05 DIAGNOSIS — M542 Cervicalgia: Secondary | ICD-10-CM | POA: Diagnosis not present

## 2017-05-08 DIAGNOSIS — M542 Cervicalgia: Secondary | ICD-10-CM | POA: Diagnosis not present

## 2017-05-15 DIAGNOSIS — M25572 Pain in left ankle and joints of left foot: Secondary | ICD-10-CM | POA: Diagnosis not present

## 2017-05-21 DIAGNOSIS — M1611 Unilateral primary osteoarthritis, right hip: Secondary | ICD-10-CM | POA: Diagnosis not present

## 2017-05-21 DIAGNOSIS — Z96649 Presence of unspecified artificial hip joint: Secondary | ICD-10-CM | POA: Diagnosis not present

## 2017-05-23 DIAGNOSIS — M545 Low back pain: Secondary | ICD-10-CM | POA: Diagnosis not present

## 2017-05-23 DIAGNOSIS — M542 Cervicalgia: Secondary | ICD-10-CM | POA: Diagnosis not present

## 2017-05-28 DIAGNOSIS — M542 Cervicalgia: Secondary | ICD-10-CM | POA: Diagnosis not present

## 2017-05-28 DIAGNOSIS — M545 Low back pain: Secondary | ICD-10-CM | POA: Diagnosis not present

## 2017-06-04 DIAGNOSIS — M542 Cervicalgia: Secondary | ICD-10-CM | POA: Diagnosis not present

## 2017-06-11 DIAGNOSIS — M542 Cervicalgia: Secondary | ICD-10-CM | POA: Diagnosis not present

## 2017-07-22 ENCOUNTER — Encounter: Payer: Self-pay | Admitting: Internal Medicine

## 2017-07-22 DIAGNOSIS — E559 Vitamin D deficiency, unspecified: Secondary | ICD-10-CM | POA: Diagnosis not present

## 2017-07-22 DIAGNOSIS — Z79899 Other long term (current) drug therapy: Secondary | ICD-10-CM | POA: Diagnosis not present

## 2017-07-22 DIAGNOSIS — Z0000 Encounter for general adult medical examination without abnormal findings: Secondary | ICD-10-CM | POA: Diagnosis not present

## 2017-07-22 DIAGNOSIS — R7303 Prediabetes: Secondary | ICD-10-CM | POA: Diagnosis not present

## 2017-07-22 DIAGNOSIS — B356 Tinea cruris: Secondary | ICD-10-CM | POA: Diagnosis not present

## 2017-07-22 DIAGNOSIS — I10 Essential (primary) hypertension: Secondary | ICD-10-CM | POA: Diagnosis not present

## 2017-07-22 DIAGNOSIS — E7800 Pure hypercholesterolemia, unspecified: Secondary | ICD-10-CM | POA: Diagnosis not present

## 2017-07-22 DIAGNOSIS — E210 Primary hyperparathyroidism: Secondary | ICD-10-CM | POA: Diagnosis not present

## 2017-07-22 DIAGNOSIS — Z1211 Encounter for screening for malignant neoplasm of colon: Secondary | ICD-10-CM | POA: Diagnosis not present

## 2017-07-25 ENCOUNTER — Encounter: Payer: Self-pay | Admitting: Internal Medicine

## 2017-07-25 NOTE — Progress Notes (Signed)
I received labs from PCP from 07/22/2017:  HbA1c 6.1 %  CBC normal  CMP normal, except glucose 116. Calcium was high at 11.0, but corrected 10.31 (8.6-10.3), BUN/creatinine 18/0.68, eGFR 86.  TSH 2.86  Vitamin D 61.1  Lipids: 201/94/87/95

## 2017-08-29 ENCOUNTER — Encounter: Payer: Self-pay | Admitting: Internal Medicine

## 2017-08-29 ENCOUNTER — Ambulatory Visit (INDEPENDENT_AMBULATORY_CARE_PROVIDER_SITE_OTHER): Payer: Medicare HMO | Admitting: Internal Medicine

## 2017-08-29 VITALS — BP 122/74 | HR 85 | Ht 64.0 in | Wt 235.8 lb

## 2017-08-29 DIAGNOSIS — M810 Age-related osteoporosis without current pathological fracture: Secondary | ICD-10-CM

## 2017-08-29 DIAGNOSIS — E559 Vitamin D deficiency, unspecified: Secondary | ICD-10-CM | POA: Diagnosis not present

## 2017-08-29 DIAGNOSIS — E210 Primary hyperparathyroidism: Secondary | ICD-10-CM | POA: Diagnosis not present

## 2017-08-29 LAB — BASIC METABOLIC PANEL WITH GFR
BUN: 23 mg/dL (ref 7–25)
CO2: 28 mmol/L (ref 20–32)
CREATININE: 0.79 mg/dL (ref 0.50–0.99)
Calcium: 10.4 mg/dL (ref 8.6–10.4)
Chloride: 105 mmol/L (ref 98–110)
GFR, EST AFRICAN AMERICAN: 90 mL/min/{1.73_m2} (ref >=60)
GFR, EST NON AFRICAN AMERICAN: 78 mL/min/{1.73_m2} (ref >=60)
Glucose, Bld: 120 mg/dL — ABNORMAL HIGH (ref 65–99)
Potassium: 5 mmol/L (ref 3.5–5.3)
Sodium: 140 mmol/L (ref 135–146)

## 2017-08-29 NOTE — Patient Instructions (Signed)
Please stop at the lab.  Continue current vitamin D supplement.  You need a DXA scan at the [facility] site.  Please come back for a follow-up appointment in 1 year.
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
## 2017-09-03 ENCOUNTER — Telehealth: Payer: Self-pay | Admitting: Internal Medicine

## 2017-09-03 DIAGNOSIS — I4510 Unspecified right bundle-branch block: Secondary | ICD-10-CM | POA: Diagnosis not present

## 2017-09-03 DIAGNOSIS — M7989 Other specified soft tissue disorders: Secondary | ICD-10-CM | POA: Diagnosis not present

## 2017-09-03 DIAGNOSIS — M79671 Pain in right foot: Secondary | ICD-10-CM | POA: Diagnosis not present

## 2017-09-03 DIAGNOSIS — R079 Chest pain, unspecified: Secondary | ICD-10-CM | POA: Diagnosis not present

## 2017-09-03 DIAGNOSIS — S20211A Contusion of right front wall of thorax, initial encounter: Secondary | ICD-10-CM | POA: Diagnosis not present

## 2017-09-03 DIAGNOSIS — T148XXA Other injury of unspecified body region, initial encounter: Secondary | ICD-10-CM | POA: Diagnosis not present

## 2017-09-03 DIAGNOSIS — S5012XA Contusion of left forearm, initial encounter: Secondary | ICD-10-CM | POA: Diagnosis not present

## 2017-09-03 DIAGNOSIS — S20212A Contusion of left front wall of thorax, initial encounter: Secondary | ICD-10-CM | POA: Diagnosis not present

## 2017-09-03 DIAGNOSIS — M79672 Pain in left foot: Secondary | ICD-10-CM | POA: Diagnosis not present

## 2017-09-03 DIAGNOSIS — S40022A Contusion of left upper arm, initial encounter: Secondary | ICD-10-CM | POA: Diagnosis not present

## 2017-09-03 DIAGNOSIS — R1031 Right lower quadrant pain: Secondary | ICD-10-CM | POA: Diagnosis not present

## 2017-09-03 DIAGNOSIS — Y92410 Unspecified street and highway as the place of occurrence of the external cause: Secondary | ICD-10-CM | POA: Diagnosis not present

## 2017-09-03 DIAGNOSIS — I493 Ventricular premature depolarization: Secondary | ICD-10-CM | POA: Diagnosis not present

## 2017-09-03 DIAGNOSIS — Y998 Other external cause status: Secondary | ICD-10-CM | POA: Diagnosis not present

## 2017-09-03 DIAGNOSIS — S90811A Abrasion, right foot, initial encounter: Secondary | ICD-10-CM | POA: Diagnosis not present

## 2017-09-03 DIAGNOSIS — S301XXA Contusion of abdominal wall, initial encounter: Secondary | ICD-10-CM | POA: Diagnosis not present

## 2017-09-03 DIAGNOSIS — N130 Hydronephrosis with ureteropelvic junction obstruction: Secondary | ICD-10-CM | POA: Diagnosis not present

## 2017-09-03 DIAGNOSIS — Z041 Encounter for examination and observation following transport accident: Secondary | ICD-10-CM | POA: Diagnosis not present

## 2017-09-03 NOTE — Telephone Encounter (Signed)
Patient called the [facility] and wanted to confirm that the Dr put in a request for a DEXA SCAN with Dr Stephanie Acre.  Please advise

## 2017-09-03 NOTE — Telephone Encounter (Signed)
Spoke to patient. She says she was just calling to confirm that she did have Dr. Stephanie Acre put in Dexa Scan orders as recommended by Dr. Cruzita Lederer.

## 2017-09-04 DIAGNOSIS — N134 Hydroureter: Secondary | ICD-10-CM | POA: Diagnosis not present

## 2017-09-04 DIAGNOSIS — N201 Calculus of ureter: Secondary | ICD-10-CM | POA: Diagnosis not present

## 2017-09-04 DIAGNOSIS — N132 Hydronephrosis with renal and ureteral calculous obstruction: Secondary | ICD-10-CM | POA: Diagnosis not present

## 2017-09-12 DIAGNOSIS — S92901A Unspecified fracture of right foot, initial encounter for closed fracture: Secondary | ICD-10-CM | POA: Diagnosis not present

## 2017-09-12 DIAGNOSIS — M79671 Pain in right foot: Secondary | ICD-10-CM | POA: Diagnosis not present

## 2017-09-12 DIAGNOSIS — S92901D Unspecified fracture of right foot, subsequent encounter for fracture with routine healing: Secondary | ICD-10-CM | POA: Diagnosis not present

## 2017-09-15 DIAGNOSIS — Z6841 Body Mass Index (BMI) 40.0 and over, adult: Secondary | ICD-10-CM | POA: Diagnosis not present

## 2017-09-15 DIAGNOSIS — N132 Hydronephrosis with renal and ureteral calculous obstruction: Secondary | ICD-10-CM | POA: Diagnosis not present

## 2017-09-15 DIAGNOSIS — E210 Primary hyperparathyroidism: Secondary | ICD-10-CM | POA: Diagnosis not present

## 2017-09-15 DIAGNOSIS — Z7982 Long term (current) use of aspirin: Secondary | ICD-10-CM | POA: Diagnosis not present

## 2017-09-15 DIAGNOSIS — Z9189 Other specified personal risk factors, not elsewhere classified: Secondary | ICD-10-CM | POA: Diagnosis not present

## 2017-09-15 DIAGNOSIS — R002 Palpitations: Secondary | ICD-10-CM | POA: Diagnosis not present

## 2017-09-15 DIAGNOSIS — E785 Hyperlipidemia, unspecified: Secondary | ICD-10-CM | POA: Diagnosis not present

## 2017-09-15 DIAGNOSIS — J309 Allergic rhinitis, unspecified: Secondary | ICD-10-CM | POA: Diagnosis not present

## 2017-09-15 DIAGNOSIS — I10 Essential (primary) hypertension: Secondary | ICD-10-CM | POA: Diagnosis not present

## 2017-09-17 DIAGNOSIS — M8588 Other specified disorders of bone density and structure, other site: Secondary | ICD-10-CM | POA: Diagnosis not present

## 2017-09-17 DIAGNOSIS — E2839 Other primary ovarian failure: Secondary | ICD-10-CM | POA: Diagnosis not present

## 2017-09-19 DIAGNOSIS — M79671 Pain in right foot: Secondary | ICD-10-CM | POA: Diagnosis not present

## 2017-09-29 ENCOUNTER — Other Ambulatory Visit: Payer: Self-pay

## 2017-09-29 ENCOUNTER — Other Ambulatory Visit: Payer: Self-pay | Admitting: Orthopedic Surgery

## 2017-09-29 ENCOUNTER — Encounter (HOSPITAL_BASED_OUTPATIENT_CLINIC_OR_DEPARTMENT_OTHER): Payer: Self-pay | Admitting: *Deleted

## 2017-09-29 DIAGNOSIS — S2001XD Contusion of right breast, subsequent encounter: Secondary | ICD-10-CM | POA: Diagnosis not present

## 2017-09-29 DIAGNOSIS — J383 Other diseases of vocal cords: Secondary | ICD-10-CM | POA: Diagnosis not present

## 2017-09-29 DIAGNOSIS — S93304D Unspecified dislocation of right foot, subsequent encounter: Secondary | ICD-10-CM | POA: Diagnosis not present

## 2017-09-29 DIAGNOSIS — S92901D Unspecified fracture of right foot, subsequent encounter for fracture with routine healing: Secondary | ICD-10-CM | POA: Diagnosis not present

## 2017-09-29 DIAGNOSIS — N200 Calculus of kidney: Secondary | ICD-10-CM | POA: Diagnosis not present
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
## 2017-10-02 ENCOUNTER — Other Ambulatory Visit: Payer: Self-pay

## 2017-10-02 ENCOUNTER — Encounter (HOSPITAL_BASED_OUTPATIENT_CLINIC_OR_DEPARTMENT_OTHER)
Admission: RE | Disposition: A | Discharge status: Home / Self Care | Payer: Self-pay | Source: Ambulatory Visit | Attending: Orthopedic Surgery

## 2017-10-02 ENCOUNTER — Ambulatory Visit (HOSPITAL_BASED_OUTPATIENT_CLINIC_OR_DEPARTMENT_OTHER): Payer: Medicare HMO | Admitting: Certified Registered"

## 2017-10-02 ENCOUNTER — Ambulatory Visit (HOSPITAL_BASED_OUTPATIENT_CLINIC_OR_DEPARTMENT_OTHER)
Admission: RE | Admit: 2017-10-02 | Discharge: 2017-10-02 | Disposition: A | Discharge status: Home / Self Care | Payer: Medicare HMO | Source: Ambulatory Visit | Attending: Orthopedic Surgery | Admitting: Orthopedic Surgery

## 2017-10-02 ENCOUNTER — Encounter (HOSPITAL_BASED_OUTPATIENT_CLINIC_OR_DEPARTMENT_OTHER): Payer: Self-pay | Admitting: Certified Registered"

## 2017-10-02 DIAGNOSIS — S92311A Displaced fracture of first metatarsal bone, right foot, initial encounter for closed fracture: Secondary | ICD-10-CM | POA: Diagnosis not present

## 2017-10-02 DIAGNOSIS — S96091A Other injury of muscle and tendon of long flexor muscle of toe at ankle and foot level, right foot, initial encounter: Secondary | ICD-10-CM | POA: Diagnosis not present

## 2017-10-02 DIAGNOSIS — Z91048 Other nonmedicinal substance allergy status: Secondary | ICD-10-CM | POA: Insufficient documentation

## 2017-10-02 DIAGNOSIS — Z79899 Other long term (current) drug therapy: Secondary | ICD-10-CM | POA: Insufficient documentation

## 2017-10-02 DIAGNOSIS — Z8249 Family history of ischemic heart disease and other diseases of the circulatory system: Secondary | ICD-10-CM | POA: Insufficient documentation

## 2017-10-02 DIAGNOSIS — Z841 Family history of disorders of kidney and ureter: Secondary | ICD-10-CM | POA: Diagnosis not present

## 2017-10-02 DIAGNOSIS — Z823 Family history of stroke: Secondary | ICD-10-CM | POA: Insufficient documentation

## 2017-10-02 DIAGNOSIS — M216X1 Other acquired deformities of right foot: Secondary | ICD-10-CM | POA: Diagnosis not present

## 2017-10-02 DIAGNOSIS — Z8349 Family history of other endocrine, nutritional and metabolic diseases: Secondary | ICD-10-CM | POA: Insufficient documentation

## 2017-10-02 DIAGNOSIS — Z96643 Presence of artificial hip joint, bilateral: Secondary | ICD-10-CM | POA: Insufficient documentation

## 2017-10-02 DIAGNOSIS — I358 Other nonrheumatic aortic valve disorders: Secondary | ICD-10-CM | POA: Insufficient documentation

## 2017-10-02 DIAGNOSIS — I493 Ventricular premature depolarization: Secondary | ICD-10-CM | POA: Diagnosis not present

## 2017-10-02 DIAGNOSIS — M160 Bilateral primary osteoarthritis of hip: Secondary | ICD-10-CM | POA: Diagnosis not present

## 2017-10-02 DIAGNOSIS — G8918 Other acute postprocedural pain: Secondary | ICD-10-CM | POA: Diagnosis not present

## 2017-10-02 DIAGNOSIS — I10 Essential (primary) hypertension: Secondary | ICD-10-CM | POA: Diagnosis not present

## 2017-10-02 DIAGNOSIS — Z7982 Long term (current) use of aspirin: Secondary | ICD-10-CM | POA: Diagnosis not present

## 2017-10-02 DIAGNOSIS — Z820 Family history of epilepsy and other diseases of the nervous system: Secondary | ICD-10-CM | POA: Diagnosis not present

## 2017-10-02 DIAGNOSIS — E782 Mixed hyperlipidemia: Secondary | ICD-10-CM | POA: Diagnosis not present

## 2017-10-02 DIAGNOSIS — R69 Illness, unspecified: Secondary | ICD-10-CM | POA: Diagnosis not present

## 2017-10-02 DIAGNOSIS — Z888 Allergy status to other drugs, medicaments and biological substances status: Secondary | ICD-10-CM | POA: Insufficient documentation

## 2017-10-02 DIAGNOSIS — S93521A Sprain of metatarsophalangeal joint of right great toe, initial encounter: Secondary | ICD-10-CM | POA: Insufficient documentation

## 2017-10-02 DIAGNOSIS — M1990 Unspecified osteoarthritis, unspecified site: Secondary | ICD-10-CM | POA: Insufficient documentation

## 2017-10-02 DIAGNOSIS — E785 Hyperlipidemia, unspecified: Secondary | ICD-10-CM | POA: Diagnosis not present

## 2017-10-02 DIAGNOSIS — F329 Major depressive disorder, single episode, unspecified: Secondary | ICD-10-CM | POA: Diagnosis not present

## 2017-10-02 DIAGNOSIS — Z85828 Personal history of other malignant neoplasm of skin: Secondary | ICD-10-CM | POA: Insufficient documentation

## 2017-10-02 DIAGNOSIS — I499 Cardiac arrhythmia, unspecified: Secondary | ICD-10-CM | POA: Insufficient documentation

## 2017-10-02 DIAGNOSIS — S92811A Other fracture of right foot, initial encounter for closed fracture: Secondary | ICD-10-CM | POA: Diagnosis not present

## 2017-10-02 DIAGNOSIS — Z6839 Body mass index (BMI) 39.0-39.9, adult: Secondary | ICD-10-CM | POA: Insufficient documentation

## 2017-10-02 DIAGNOSIS — Z884 Allergy status to anesthetic agent status: Secondary | ICD-10-CM | POA: Insufficient documentation

## 2017-10-02 DIAGNOSIS — S96011A Strain of muscle and tendon of long flexor muscle of toe at ankle and foot level, right foot, initial encounter: Secondary | ICD-10-CM | POA: Diagnosis not present

## 2017-10-02 DIAGNOSIS — M205X1 Other deformities of toe(s) (acquired), right foot: Secondary | ICD-10-CM | POA: Diagnosis not present

## 2017-10-02 DIAGNOSIS — S99921D Unspecified injury of right foot, subsequent encounter: Secondary | ICD-10-CM

## 2017-10-02 HISTORY — DX: Unspecified injury of right foot, initial encounter: S99.921A

## 2017-10-02 HISTORY — DX: Other fracture of unspecified foot, initial encounter for closed fracture: S92.819A

## 2017-10-02 HISTORY — PX: ORIF TOE FRACTURE: SHX5032

## 2017-10-02 HISTORY — PX: FLEXOR TENDON REPAIR: SHX6501

## 2017-10-02 SURGERY — OPEN REDUCTION INTERNAL FIXATION (ORIF) METATARSAL (TOE) FRACTURE
Anesthesia: General | Site: Foot | Laterality: Right

## 2017-10-02 MED ORDER — OXYCODONE HCL 5 MG PO TABS: 5.0000 mg | ORAL_TABLET | ORAL | 0 refills | Status: DC | PRN

## 2017-10-02 MED ORDER — CEFAZOLIN SODIUM-DEXTROSE 2-4 GM/100ML-% IV SOLN
2.0000 g | INTRAVENOUS | Status: AC
  Administered 2017-10-02: 2 g via INTRAVENOUS

## 2017-10-02 MED ORDER — BUPIVACAINE-EPINEPHRINE (PF) 0.5% -1:200000 IJ SOLN
INTRAMUSCULAR | Status: DC | PRN
  Administered 2017-10-02: 30 mL via PERINEURAL

## 2017-10-02 MED ORDER — CHLORHEXIDINE GLUCONATE 4 % EX LIQD: 60.0000 mL | Freq: Once | CUTANEOUS | Status: DC

## 2017-10-02 MED ORDER — SODIUM CHLORIDE 0.9 % IV SOLN: INTRAVENOUS | Status: DC

## 2017-10-02 MED ORDER — FENTANYL CITRATE (PF) 100 MCG/2ML IJ SOLN: 25.0000 ug | INTRAMUSCULAR | Status: DC | PRN

## 2017-10-02 MED ORDER — DOCUSATE SODIUM 100 MG PO CAPS: 100.0000 mg | ORAL_CAPSULE | Freq: Two times a day (BID) | ORAL | 0 refills | Status: DC

## 2017-10-02 MED ORDER — MIDAZOLAM HCL 2 MG/2ML IJ SOLN: 1.0000 mg | INTRAMUSCULAR | Status: DC | PRN

## 2017-10-02 MED ORDER — FENTANYL CITRATE (PF) 100 MCG/2ML IJ SOLN
50.0000 ug | INTRAMUSCULAR | Status: DC | PRN
  Administered 2017-10-02: 50 ug via INTRAVENOUS

## 2017-10-02 MED ORDER — LACTATED RINGERS IV SOLN
INTRAVENOUS | Status: DC
  Administered 2017-10-02 (×2): via INTRAVENOUS

## 2017-10-02 MED ORDER — CEFAZOLIN SODIUM-DEXTROSE 2-4 GM/100ML-% IV SOLN
INTRAVENOUS | Status: AC
  Filled 2017-10-02: qty 100

## 2017-10-02 MED ORDER — PROPOFOL 10 MG/ML IV BOLUS
INTRAVENOUS | Status: DC | PRN
  Administered 2017-10-02: 150 mg via INTRAVENOUS

## 2017-10-02 MED ORDER — SENNA 8.6 MG PO TABS: 2.0000 | ORAL_TABLET | Freq: Two times a day (BID) | ORAL | 0 refills | Status: DC

## 2017-10-02 MED ORDER — SCOPOLAMINE 1 MG/3DAYS TD PT72: 1.0000 | MEDICATED_PATCH | Freq: Once | TRANSDERMAL | Status: DC | PRN

## 2017-10-02 MED ORDER — PROPOFOL 500 MG/50ML IV EMUL
INTRAVENOUS | Status: DC | PRN
  Administered 2017-10-02: 150 ug/kg/min via INTRAVENOUS

## 2017-10-02 MED ORDER — MIDAZOLAM HCL 2 MG/2ML IJ SOLN
INTRAMUSCULAR | Status: AC
  Filled 2017-10-02: qty 2

## 2017-10-02 MED ORDER — FENTANYL CITRATE (PF) 100 MCG/2ML IJ SOLN
INTRAMUSCULAR | Status: AC
  Filled 2017-10-02: qty 2

## 2017-10-02 MED ORDER — ONDANSETRON HCL 4 MG/2ML IJ SOLN
4.0000 mg | Freq: Once | INTRAMUSCULAR | Status: AC | PRN
  Administered 2017-10-02: 4 mg via INTRAVENOUS

## 2017-10-02 SURGICAL SUPPLY — 67 items
ANCHOR SUT 1.45 SZ 1 SHORT (Anchor) ×3 IMPLANT
BANDAGE ESMARK 6X9 LF (GAUZE/BANDAGES/DRESSINGS) IMPLANT
BLADE SURG 15 STRL LF DISP TIS (BLADE) ×2 IMPLANT
BLADE SURG 15 STRL SS (BLADE) ×4
BNDG COHESIVE 4X5 TAN STRL (GAUZE/BANDAGES/DRESSINGS) ×3 IMPLANT
BNDG COHESIVE 6X5 TAN STRL LF (GAUZE/BANDAGES/DRESSINGS) IMPLANT
BNDG CONFORM 2 STRL LF (GAUZE/BANDAGES/DRESSINGS) ×3 IMPLANT
BNDG ESMARK 4X9 LF (GAUZE/BANDAGES/DRESSINGS) IMPLANT
BNDG ESMARK 6X9 LF (GAUZE/BANDAGES/DRESSINGS)
BOOT STEPPER DURA MED (SOFTGOODS) ×3 IMPLANT
CANISTER SUCT 1200ML W/VALVE (MISCELLANEOUS) ×3 IMPLANT
CHLORAPREP W/TINT 26ML (MISCELLANEOUS) ×3 IMPLANT
COVER BACK TABLE 60X90IN (DRAPES) ×3 IMPLANT
CUFF TOURNIQUET SINGLE 34IN LL (TOURNIQUET CUFF) ×3 IMPLANT
DECANTER SPIKE VIAL GLASS SM (MISCELLANEOUS) IMPLANT
DRAPE EXTREMITY T 121X128X90 (DRAPE) ×3 IMPLANT
DRAPE OEC MINIVIEW 54X84 (DRAPES) ×3 IMPLANT
DRAPE U-SHAPE 47X51 STRL (DRAPES) ×3 IMPLANT
DRSG MEPITEL 4X7.2 (GAUZE/BANDAGES/DRESSINGS) ×3 IMPLANT
DRSG PAD ABDOMINAL 8X10 ST (GAUZE/BANDAGES/DRESSINGS) ×6 IMPLANT
ELECT REM PT RETURN 9FT ADLT (ELECTROSURGICAL) ×3
ELECTRODE REM PT RTRN 9FT ADLT (ELECTROSURGICAL) ×1 IMPLANT
GAUZE SPONGE 4X4 12PLY STRL (GAUZE/BANDAGES/DRESSINGS) ×3 IMPLANT
GLOVE BIO SURGEON STRL SZ 6.5 (GLOVE) ×2 IMPLANT
GLOVE BIO SURGEON STRL SZ8 (GLOVE) ×3 IMPLANT
GLOVE BIO SURGEONS STRL SZ 6.5 (GLOVE) ×1
GLOVE BIOGEL PI IND STRL 7.0 (GLOVE) ×2 IMPLANT
GLOVE BIOGEL PI IND STRL 8 (GLOVE) ×2 IMPLANT
GLOVE BIOGEL PI INDICATOR 7.0 (GLOVE) ×4
GLOVE BIOGEL PI INDICATOR 8 (GLOVE) ×4
GLOVE ECLIPSE 8.0 STRL XLNG CF (GLOVE) ×3 IMPLANT
GOWN STRL REUS W/ TWL LRG LVL3 (GOWN DISPOSABLE) ×1 IMPLANT
GOWN STRL REUS W/ TWL XL LVL3 (GOWN DISPOSABLE) ×2 IMPLANT
GOWN STRL REUS W/TWL LRG LVL3 (GOWN DISPOSABLE) ×2
GOWN STRL REUS W/TWL XL LVL3 (GOWN DISPOSABLE) ×4
NEEDLE HYPO 22GX1.5 SAFETY (NEEDLE) IMPLANT
NS IRRIG 1000ML POUR BTL (IV SOLUTION) ×3 IMPLANT
PACK BASIN DAY SURGERY FS (CUSTOM PROCEDURE TRAY) ×3 IMPLANT
PAD CAST 4YDX4 CTTN HI CHSV (CAST SUPPLIES) ×1 IMPLANT
PADDING CAST ABS 4INX4YD NS (CAST SUPPLIES)
PADDING CAST ABS COTTON 4X4 ST (CAST SUPPLIES) IMPLANT
PADDING CAST COTTON 4X4 STRL (CAST SUPPLIES) ×2
PADDING CAST COTTON 6X4 STRL (CAST SUPPLIES) IMPLANT
PENCIL BUTTON HOLSTER BLD 10FT (ELECTRODE) ×3 IMPLANT
SANITIZER HAND PURELL 535ML FO (MISCELLANEOUS) ×3 IMPLANT
SHEET MEDIUM DRAPE 40X70 STRL (DRAPES) ×3 IMPLANT
SLEEVE SCD COMPRESS KNEE MED (MISCELLANEOUS) ×3 IMPLANT
SPLINT FAST PLASTER 5X30 (CAST SUPPLIES)
SPLINT PLASTER CAST FAST 5X30 (CAST SUPPLIES) IMPLANT
SPONGE LAP 18X18 RF (DISPOSABLE) ×3 IMPLANT
STOCKINETTE 6  STRL (DRAPES) ×2
STOCKINETTE 6 STRL (DRAPES) ×1 IMPLANT
SUCTION FRAZIER HANDLE 10FR (MISCELLANEOUS) ×2
SUCTION TUBE FRAZIER 10FR DISP (MISCELLANEOUS) ×1 IMPLANT
SUT ETHILON 3 0 PS 1 (SUTURE) ×3 IMPLANT
SUT FIBERWIRE #2 38 T-5 BLUE (SUTURE)
SUT MNCRL AB 3-0 PS2 18 (SUTURE) ×3 IMPLANT
SUT VIC AB 0 SH 27 (SUTURE) ×6 IMPLANT
SUT VIC AB 2-0 SH 27 (SUTURE)
SUT VIC AB 2-0 SH 27XBRD (SUTURE) IMPLANT
SUTURE FIBERWR #2 38 T-5 BLUE (SUTURE) IMPLANT
SYR BULB 3OZ (MISCELLANEOUS) ×3 IMPLANT
SYR CONTROL 10ML LL (SYRINGE) IMPLANT
TOWEL OR 17X24 6PK STRL BLUE (TOWEL DISPOSABLE) ×6 IMPLANT
TUBE CONNECTING 20'X1/4 (TUBING) ×1
TUBE CONNECTING 20X1/4 (TUBING) ×2 IMPLANT
UNDERPAD 30X30 (UNDERPADS AND DIAPERS) ×3 IMPLANT

## 2017-10-02 NOTE — Progress Notes (Signed)
Assisted Dr. Turk with right, ultrasound guided, popliteal/saphenous block. Side rails up, monitors on throughout procedure. See vital signs in flow sheet. Tolerated Procedure well. 
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
## 2017-10-02 NOTE — Anesthesia Procedure Notes (Signed)
Procedure Name: LMA Insertion Date/Time: 10/02/2017 9:20 AM Performed by: Signe Colt, CRNA Pre-anesthesia Checklist: Patient identified, Emergency Drugs available, Suction available and Patient being monitored Patient Re-evaluated:Patient Re-evaluated prior to induction Oxygen Delivery Method: Circle system utilized Preoxygenation: Pre-oxygenation with 100% oxygen Induction Type: IV induction Ventilation: Mask ventilation without difficulty LMA: LMA inserted LMA Size: 4.0 Number of attempts: 1 Airway Equipment and Method: Bite block Placement Confirmation: positive ETCO2 Tube secured with: Tape Dental Injury: Teeth and Oropharynx as per pre-operative assessment

## 2017-10-02 NOTE — Anesthesia Postprocedure Evaluation (Signed)
Anesthesia Post Note  Patient: Skylen Danielsen  Procedure(s) Performed: Right plantar plate reconstruction; Right sesmoid open reduction internal fixation; right flexor hallicus longus reconstruction (Right Foot) FLEXOR TENDON REPAIR (Right Foot)     Patient location during evaluation: PACU Anesthesia Type: General Level of consciousness: awake and alert Pain management: pain level controlled Vital Signs Assessment: post-procedure vital signs reviewed and stable Respiratory status: spontaneous breathing, nonlabored ventilation and respiratory function stable Cardiovascular status: blood pressure returned to baseline and stable Postop Assessment: no apparent nausea or vomiting Anesthetic complications: no    Last Vitals:  Vitals:   10/02/17 1115 10/02/17 1200  BP: 133/83 (!) 142/75  Pulse: 61 (!) 58  Resp: 12 18  Temp:  36.5 C  SpO2: 100% 98%    Last Pain:  Vitals:   10/02/17 1200  TempSrc:   PainSc: 0-No pain                 Catalina Gravel

## 2017-10-02 NOTE — Discharge Instructions (Addendum)
[name], MD [place]  Please read the following information regarding your care after surgery.  Medications  You only need a prescription for the narcotic pain medicine (ex. oxycodone, Percocet, Norco).  All of the other medicines listed below are available over the counter. X Aleve 2 pills twice a day for the first 3 days after surgery. X acetominophen (Tylenol) 650 mg every 4-6 hours as you need for minor to moderate pain X oxycodone as prescribed for severe pain  Narcotic pain medicine (ex. oxycodone, Percocet, Vicodin) will cause constipation.  To prevent this problem, take the following medicines while you are taking any pain medicine. X docusate sodium (Colace) 100 mg twice a day X senna (Senokot) 2 tablets twice a day   Weight Bearing X Bear weight when you are able on your operated leg or foot in the CAM boot.  Please do not remove the CAM boot until you are seen at your first post-op visit. ? Bear weight only on your operated foot in the post-op shoe. ? Do not bear any weight on the operated leg or foot.  Cast / Splint / Dressing X Keep your splint, cast or dressing clean and dry.  Dont put anything (coat hanger, pencil, etc) down inside of it.  If it gets damp, use a hair dryer on the cool setting to dry it.  If it gets soaked, call the office to schedule an appointment for a cast change.   After your dressing, cast or splint is removed; you may shower, but do not soak or scrub the wound.  Allow the water to run over it, and then gently pat it dry.  Swelling It is normal for you to have swelling where you had surgery.  To reduce swelling and pain, keep your toes above your nose for at least 3 days after surgery.  It may be necessary to keep your foot or leg elevated for several weeks.  If it hurts, it should be elevated.  Follow Up Call my office at [phone] when you are discharged from the hospital or surgery center to schedule an appointment to be seen  two weeks after surgery.  Call my office at [phone] if you develop a fever >101.5 F, nausea, vomiting, bleeding from the surgical site or severe pain.    Post Anesthesia Home Care Instructions  Activity: Get plenty of rest for the remainder of the day. A responsible individual must stay with you for 24 hours following the procedure.  For the next 24 hours, DO NOT: -Drive a car -Paediatric nurse -Drink alcoholic beverages -Take any medication unless instructed by your physician -Make any legal decisions or sign important papers.  Meals: Start with liquid foods such as gelatin or soup. Progress to regular foods as tolerated. Avoid greasy, spicy, heavy foods. If nausea and/or vomiting occur, drink only clear liquids until the nausea and/or vomiting subsides. Call your physician if vomiting continues.  Special Instructions/Symptoms: Your throat may feel dry or sore from the anesthesia or the breathing tube placed in your throat during surgery. If this causes discomfort, gargle with warm salt water. The discomfort should disappear within 24 hours.  If you had a scopolamine patch placed behind your ear for the management of post- operative nausea and/or vomiting:  1. The medication in the patch is effective for 72 hours, after which it should be removed.  Wrap patch in a tissue and discard in the trash. Wash hands thoroughly with soap and water. 2. You may remove  the patch earlier than 72 hours if you experience unpleasant side effects which may include dry mouth, dizziness or visual disturbances. 3. Avoid touching the patch. Wash your hands with soap and water after contact with the patch.   Regional Anesthesia Blocks  1. Numbness or the inability to move the "blocked" extremity may last from 3-48 hours after placement. The length of time depends on the medication injected and your individual response to the medication. If the numbness is not going away after 48 hours, call your  surgeon.  2. The extremity that is blocked will need to be protected until the numbness is gone and the  Strength has returned. Because you cannot feel it, you will need to take extra care to avoid injury. Because it may be weak, you may have difficulty moving it or using it. You may not know what position it is in without looking at it while the block is in effect.  3. For blocks in the legs and feet, returning to weight bearing and walking needs to be done carefully. You will need to wait until the numbness is entirely gone and the strength has returned. You should be able to move your leg and foot normally before you try and bear weight or walk. You will need someone to be with you when you first try to ensure you do not fall and possibly risk injury.  4. Bruising and tenderness at the needle site are common side effects and will resolve in a few days.  5. Persistent numbness or new problems with movement should be communicated to the surgeon or the [facility] [phone] [place] [phone]).

## 2017-10-02 NOTE — Anesthesia Procedure Notes (Signed)
Anesthesia Regional Block: Popliteal block   Pre-Anesthetic Checklist: ,, timeout performed, Correct Patient, Correct Site, Correct Laterality, Correct Procedure, Correct Position, site marked, Risks and benefits discussed,  Surgical consent,  Pre-op evaluation,  At surgeon's request and post-op pain management  Laterality: Right  Prep: chloraprep       Needles:  Injection technique: Single-shot  Needle Type: Echogenic Needle     Needle Length: 9cm  Needle Gauge: 21     Additional Needles:   Procedures:,,,, ultrasound used (permanent image in chart),,,,  Narrative:  Start time: 10/02/2017 8:57 AM End time: 10/02/2017 9:03 AM Injection made incrementally with aspirations every 5 mL.  Performed by: Personally  Anesthesiologist: Catalina Gravel, MD  Additional Notes: No pain on injection. No increased resistance to injection. Injection made in 5cc increments.  Good needle visualization.  Patient tolerated procedure well.

## 2017-10-02 NOTE — Anesthesia Preprocedure Evaluation (Addendum)
Anesthesia Evaluation  Patient identified by MRN, date of birth, ID band Patient awake    Reviewed: Allergy & Precautions, NPO status , Patient's Chart, lab work & pertinent test results  Airway Mallampati: II  TM Distance: >3 FB Neck ROM: Full    Dental  (+) Teeth Intact, Dental Advisory Given   Pulmonary neg pulmonary ROS,    Pulmonary exam normal breath sounds clear to auscultation       Cardiovascular hypertension, Pt. on medications Normal cardiovascular exam+ dysrhythmias (PVCs)  Rhythm:Regular Rate:Normal  Echo 08/05/13: Study Conclusions  - Left ventricle: The cavity size was normal. Systolic function was normal. The estimated ejection fraction wasin the range of 60% to 65%. Wall motion was normal; therewere no regional wall motion abnormalities. Dopplerparameters are consistent with abnormal left ventricularrelaxation (grade 1 diastolic dysfunction). - Left atrium: The atrium was mildly dilated.   Neuro/Psych PSYCHIATRIC DISORDERS Depression negative neurological ROS     GI/Hepatic negative GI ROS, Neg liver ROS,   Endo/Other  Morbid obesity  Renal/GU negative Renal ROS     Musculoskeletal  (+) Arthritis , Osteoarthritis,  Right hallux plantar plate rupture; flexor hallicus longus avulsion, sesmoid fractures   Abdominal   Peds  Hematology negative hematology ROS (+)   Anesthesia Other Findings Day of surgery medications reviewed with the patient.  Reproductive/Obstetrics                            Anesthesia Physical Anesthesia Plan  ASA: III  Anesthesia Plan: General   Post-op Pain Management:  Regional for Post-op pain   Induction: Intravenous  PONV Risk Score and Plan: 3 and TIVA, Dexamethasone and Ondansetron  Airway Management Planned: LMA  Additional Equipment:   Intra-op Plan:   Post-operative Plan: Extubation in OR  Informed Consent: I have reviewed the  patients History and Physical, chart, labs and discussed the procedure including the risks, benefits and alternatives for the proposed anesthesia with the patient or authorized representative who has indicated his/her understanding and acceptance.   Dental advisory given  Plan Discussed with: CRNA  Anesthesia Plan Comments: (NO VERSED. TIVA.)        Anesthesia Quick Evaluation
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
## 2017-10-02 NOTE — Transfer of Care (Signed)
Immediate Anesthesia Transfer of Care Note  Patient: Alicia Schroeder  Procedure(s) Performed: Right plantar plate reconstruction; Right sesmoid open reduction internal fixation; right flexor hallicus longus reconstruction (Right Foot) FLEXOR TENDON REPAIR (Right Foot)  Patient Location: PACU  Anesthesia Type:GA combined with regional for post-op pain  Level of Consciousness: awake and patient cooperative  Airway & Oxygen Therapy: Patient Spontanous Breathing and Patient connected to face mask oxygen  Post-op Assessment: Report given to RN and Post -op Vital signs reviewed and stable  Post vital signs: Reviewed and stable  Last Vitals:  Vitals Value Taken Time  BP    Temp    Pulse    Resp    SpO2      Last Pain:  Vitals:   10/02/17 0828  TempSrc: Oral  PainSc: 1       Patients Stated Pain Goal: 0 ([id number])  Complications: No apparent anesthesia complications

## 2017-10-03 ENCOUNTER — Encounter (HOSPITAL_BASED_OUTPATIENT_CLINIC_OR_DEPARTMENT_OTHER): Payer: Self-pay | Admitting: Orthopedic Surgery

## 2017-10-07 DIAGNOSIS — Z87442 Personal history of urinary calculi: Secondary | ICD-10-CM | POA: Diagnosis not present

## 2017-10-15 DIAGNOSIS — E210 Primary hyperparathyroidism: Secondary | ICD-10-CM | POA: Diagnosis not present

## 2017-10-15 DIAGNOSIS — N200 Calculus of kidney: Secondary | ICD-10-CM | POA: Diagnosis not present

## 2017-10-15 DIAGNOSIS — N281 Cyst of kidney, acquired: Secondary | ICD-10-CM | POA: Diagnosis not present

## 2017-10-20 ENCOUNTER — Encounter: Payer: Self-pay | Admitting: Internal Medicine

## 2017-10-28 ENCOUNTER — Other Ambulatory Visit: Payer: Self-pay | Admitting: Internal Medicine

## 2017-10-28 DIAGNOSIS — E210 Primary hyperparathyroidism: Secondary | ICD-10-CM

## 2017-10-31 DIAGNOSIS — S93304D Unspecified dislocation of right foot, subsequent encounter: Secondary | ICD-10-CM | POA: Diagnosis not present

## 2017-10-31 DIAGNOSIS — S92901D Unspecified fracture of right foot, subsequent encounter for fracture with routine healing: Secondary | ICD-10-CM | POA: Diagnosis not present

## 2017-10-31 DIAGNOSIS — Z4789 Encounter for other orthopedic aftercare: Secondary | ICD-10-CM | POA: Diagnosis not present

## 2017-11-13 DIAGNOSIS — S92901D Unspecified fracture of right foot, subsequent encounter for fracture with routine healing: Secondary | ICD-10-CM | POA: Diagnosis not present

## 2017-11-13 DIAGNOSIS — S93304D Unspecified dislocation of right foot, subsequent encounter: Secondary | ICD-10-CM | POA: Diagnosis not present

## 2017-11-13 DIAGNOSIS — Z4789 Encounter for other orthopedic aftercare: Secondary | ICD-10-CM | POA: Diagnosis not present

## 2017-11-13 DIAGNOSIS — M79671 Pain in right foot: Secondary | ICD-10-CM | POA: Diagnosis not present

## 2017-12-03 DIAGNOSIS — M79671 Pain in right foot: Secondary | ICD-10-CM | POA: Diagnosis not present

## 2017-12-09 DIAGNOSIS — M79671 Pain in right foot: Secondary | ICD-10-CM | POA: Diagnosis not present

## 2017-12-12 DIAGNOSIS — M79671 Pain in right foot: Secondary | ICD-10-CM | POA: Diagnosis not present

## 2017-12-16 DIAGNOSIS — M79671 Pain in right foot: Secondary | ICD-10-CM | POA: Diagnosis not present

## 2017-12-30 DIAGNOSIS — M79671 Pain in right foot: Secondary | ICD-10-CM | POA: Diagnosis not present

## 2017-12-31 ENCOUNTER — Telehealth: Payer: Self-pay | Admitting: Cardiology

## 2017-12-31 MED ORDER — ACEBUTOLOL HCL 200 MG PO CAPS: 200.0000 mg | ORAL_CAPSULE | Freq: Two times a day (BID) | ORAL | 0 refills | Status: DC

## 2017-12-31 NOTE — Telephone Encounter (Signed)
New Message:       *STAT* If patient is at the pharmacy, call can be transferred to refill team.   1. Which medications need to be refilled? (please list name of each medication and dose if known) acebutolol (SECTRAL) 200 MG capsule  2. Which pharmacy/location (including street and city if local pharmacy) is medication to be sent to?[id number] - [place], [place] - [address] AT [address]  3. Do they need a 30 day or 90 day supply? 16    *Pt is out of town and pt has left her medication and needs this medication as soon as we can get it there.

## 2018-01-09 DIAGNOSIS — M79671 Pain in right foot: Secondary | ICD-10-CM | POA: Diagnosis not present

## 2018-01-09 DIAGNOSIS — E210 Primary hyperparathyroidism: Secondary | ICD-10-CM | POA: Diagnosis not present

## 2018-01-13 DIAGNOSIS — M79671 Pain in right foot: Secondary | ICD-10-CM | POA: Diagnosis not present

## 2018-01-16 DIAGNOSIS — M79671 Pain in right foot: Secondary | ICD-10-CM | POA: Diagnosis not present

## 2018-01-20 DIAGNOSIS — M79671 Pain in right foot: Secondary | ICD-10-CM | POA: Diagnosis not present

## 2018-01-21 DIAGNOSIS — M79671 Pain in right foot: Secondary | ICD-10-CM | POA: Diagnosis not present

## 2018-01-21 DIAGNOSIS — Z09 Encounter for follow-up examination after completed treatment for conditions other than malignant neoplasm: Secondary | ICD-10-CM | POA: Diagnosis not present

## 2018-01-21 DIAGNOSIS — S93304D Unspecified dislocation of right foot, subsequent encounter: Secondary | ICD-10-CM | POA: Diagnosis not present

## 2018-01-21 DIAGNOSIS — S92901D Unspecified fracture of right foot, subsequent encounter for fracture with routine healing: Secondary | ICD-10-CM | POA: Diagnosis not present

## 2018-02-03 DIAGNOSIS — I10 Essential (primary) hypertension: Secondary | ICD-10-CM | POA: Diagnosis not present

## 2018-02-03 DIAGNOSIS — E213 Hyperparathyroidism, unspecified: Secondary | ICD-10-CM | POA: Diagnosis not present

## 2018-02-03 DIAGNOSIS — M169 Osteoarthritis of hip, unspecified: Secondary | ICD-10-CM | POA: Diagnosis not present

## 2018-02-03 DIAGNOSIS — Z01818 Encounter for other preprocedural examination: Secondary | ICD-10-CM | POA: Diagnosis not present

## 2018-02-03 DIAGNOSIS — C4491 Basal cell carcinoma of skin, unspecified: Secondary | ICD-10-CM | POA: Diagnosis not present

## 2018-02-03 DIAGNOSIS — E210 Primary hyperparathyroidism: Secondary | ICD-10-CM | POA: Diagnosis not present

## 2018-02-03 DIAGNOSIS — E559 Vitamin D deficiency, unspecified: Secondary | ICD-10-CM | POA: Diagnosis not present

## 2018-02-03 DIAGNOSIS — E782 Mixed hyperlipidemia: Secondary | ICD-10-CM | POA: Diagnosis not present

## 2018-02-17 DIAGNOSIS — N281 Cyst of kidney, acquired: Secondary | ICD-10-CM | POA: Diagnosis not present

## 2018-02-17 DIAGNOSIS — N2889 Other specified disorders of kidney and ureter: Secondary | ICD-10-CM | POA: Diagnosis not present

## 2018-02-17 DIAGNOSIS — N132 Hydronephrosis with renal and ureteral calculous obstruction: Secondary | ICD-10-CM | POA: Diagnosis not present

## 2018-02-17 DIAGNOSIS — N201 Calculus of ureter: Secondary | ICD-10-CM | POA: Diagnosis not present

## 2018-02-17 DIAGNOSIS — N134 Hydroureter: Secondary | ICD-10-CM | POA: Diagnosis not present

## 2018-02-17 DIAGNOSIS — N202 Calculus of kidney with calculus of ureter: Secondary | ICD-10-CM | POA: Diagnosis not present

## 2018-02-17 DIAGNOSIS — E210 Primary hyperparathyroidism: Secondary | ICD-10-CM | POA: Diagnosis not present

## 2018-02-25 DIAGNOSIS — E782 Mixed hyperlipidemia: Secondary | ICD-10-CM | POA: Diagnosis not present

## 2018-02-25 DIAGNOSIS — D351 Benign neoplasm of parathyroid gland: Secondary | ICD-10-CM | POA: Diagnosis not present

## 2018-02-25 DIAGNOSIS — M8580 Other specified disorders of bone density and structure, unspecified site: Secondary | ICD-10-CM | POA: Diagnosis not present

## 2018-02-25 DIAGNOSIS — E210 Primary hyperparathyroidism: Secondary | ICD-10-CM | POA: Diagnosis not present

## 2018-02-25 DIAGNOSIS — N200 Calculus of kidney: Secondary | ICD-10-CM | POA: Diagnosis not present

## 2018-02-25 DIAGNOSIS — I10 Essential (primary) hypertension: Secondary | ICD-10-CM | POA: Diagnosis not present

## 2018-02-25 DIAGNOSIS — E559 Vitamin D deficiency, unspecified: Secondary | ICD-10-CM | POA: Diagnosis not present

## 2018-02-25 DIAGNOSIS — M419 Scoliosis, unspecified: Secondary | ICD-10-CM | POA: Diagnosis not present

## 2018-02-25 DIAGNOSIS — Z85828 Personal history of other malignant neoplasm of skin: Secondary | ICD-10-CM | POA: Diagnosis not present

## 2018-02-25 DIAGNOSIS — Z9889 Other specified postprocedural states: Secondary | ICD-10-CM | POA: Diagnosis not present

## 2018-03-01 ENCOUNTER — Other Ambulatory Visit: Payer: Self-pay | Admitting: Cardiology

## 2018-03-02 NOTE — Telephone Encounter (Signed)
Rx has been sent to the pharmacy electronically. ° °

## 2018-03-12 DIAGNOSIS — R0600 Dyspnea, unspecified: Secondary | ICD-10-CM | POA: Diagnosis not present

## 2018-03-12 DIAGNOSIS — R0609 Other forms of dyspnea: Secondary | ICD-10-CM | POA: Diagnosis not present

## 2018-03-12 DIAGNOSIS — Z79899 Other long term (current) drug therapy: Secondary | ICD-10-CM | POA: Diagnosis not present

## 2018-03-12 DIAGNOSIS — Z9889 Other specified postprocedural states: Secondary | ICD-10-CM | POA: Diagnosis not present

## 2018-03-12 DIAGNOSIS — Z4889 Encounter for other specified surgical aftercare: Secondary | ICD-10-CM | POA: Diagnosis not present

## 2018-03-27 DIAGNOSIS — Z23 Encounter for immunization: Secondary | ICD-10-CM | POA: Diagnosis not present

## 2018-03-30 ENCOUNTER — Encounter: Payer: Self-pay | Admitting: Cardiology

## 2018-03-30 ENCOUNTER — Ambulatory Visit (INDEPENDENT_AMBULATORY_CARE_PROVIDER_SITE_OTHER): Payer: Medicare HMO | Admitting: Cardiology

## 2018-03-30 VITALS — BP 150/80 | HR 65 | Ht 64.0 in | Wt 237.8 lb

## 2018-03-30 DIAGNOSIS — I493 Ventricular premature depolarization: Secondary | ICD-10-CM | POA: Diagnosis not present

## 2018-03-30 DIAGNOSIS — I10 Essential (primary) hypertension: Secondary | ICD-10-CM | POA: Diagnosis not present

## 2018-03-30 DIAGNOSIS — E782 Mixed hyperlipidemia: Secondary | ICD-10-CM | POA: Diagnosis not present

## 2018-03-30 DIAGNOSIS — E669 Obesity, unspecified: Secondary | ICD-10-CM
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
## 2018-03-30 NOTE — Patient Instructions (Signed)
Medication Instructions:  Not needed If you need a refill on your cardiac medications before your next appointment, please call your pharmacy.   Lab work: Not needed  If you have labs (blood work) drawn today and your tests are completely normal, you will receive your results only by: Marland Kitchen MyChart Message (if you have MyChart) OR . A paper copy in the mail If you have any lab test that is abnormal or we need to change your treatment, we will call you to review the results.  Testing/Procedures: Not needed  Follow-Up: At [facility], you and your health needs are our priority.  As part of our continuing mission to provide you with exceptional heart care, we have created designated Provider Care Teams.  These Care Teams include your primary Cardiologist (physician) and Advanced Practice Providers (APPs -  Physician Assistants and Nurse Practitioners) who all work together to provide you with the care you need, when you need it. You will need a follow up appointment in 12 months.  Please call our office 2 months in advance to schedule this appointment.  You may see Glenetta Hew, MD or one of the following Advanced Practice Providers on your designated Care Team:   Rosaria Ferries, PA-C . Jory Sims, DNP, ANP  Any Other Special Instructions Will Be Listed Below (If Applicable).  Keep an reading on blood pressure -- avg below Systolic number  [phone] (top number) for the next month. If it trends higher,please contact office.

## 2018-03-31 ENCOUNTER — Other Ambulatory Visit: Payer: Self-pay | Admitting: Cardiology

## 2018-03-31 NOTE — Telephone Encounter (Signed)
°*  STAT* If patient is at the pharmacy, call can be transferred to refill team.   1. Which medications need to be refilled? (please list name of each medication and dose if known) Acebutolol-please call asap-completely out of it  2. Which pharmacy/location (including street and city if local pharmacy) is medication to be sent to?[phone]  3. Do they need a 30 day or 90 day supply? 180 and refills

## 2018-04-01 ENCOUNTER — Encounter: Payer: Self-pay | Admitting: Cardiology

## 2018-04-01 ENCOUNTER — Telehealth: Payer: Self-pay | Admitting: Cardiology

## 2018-04-01 MED ORDER — ACEBUTOLOL HCL 200 MG PO CAPS: 200.0000 mg | ORAL_CAPSULE | Freq: Two times a day (BID) | ORAL | 3 refills | Status: DC

## 2018-04-01 MED ORDER — ACEBUTOLOL HCL 200 MG PO CAPS: 200.0000 mg | ORAL_CAPSULE | Freq: Two times a day (BID) | ORAL | 11 refills | Status: DC

## 2018-04-01 NOTE — Assessment & Plan Note (Signed)
Well-controlled with acebutolol.  Minimal recurrence.

## 2018-04-01 NOTE — Telephone Encounter (Signed)
New message:       Pt c/o medication issue:  1. Name of Medication: acebutolol (SECTRAL) 200 MG capsule  2. How are you currently taking this medication (dosage and times per day)?  Take 1 capsule (200 mg total) by mouth 2 (two) times daily. 3. Are you having a reaction (difficulty breathing--STAT)? No  4. What is your medication issue? Pt is calling and states she is only getting a one time a month type refill and she normally gets enough for the year. Pt states that [facility] is the only location she can get this medicine and she can't run back and forth to the pharmacy every month to get medication. Please advise.

## 2018-04-01 NOTE — Telephone Encounter (Signed)
Returned call to patient she stated she would like sectral [facility] day [facility] to [facility] [address].Refill sent in.

## 2018-04-01 NOTE — Assessment & Plan Note (Signed)
The plan was for her to get back in exercising after her hip surgery, but she has not yet gotten back into her regimen to do that.  I counseled her on the importance of actually getting back and exercising and working up to try to broadening her blood pressure and cholesterol down.

## 2018-04-01 NOTE — Assessment & Plan Note (Signed)
Blood pressure is really high today.  I rechecked it and it was down from 159 systolic into the 539Y, still high.  I want her to just monitor her blood pressures at home and touch back base with [place] in a few months.  If her pressures continue to be elevated, will probably need to add a new medication probably either HCTZ or calcium channel blocker since she is already on ACE inhibitor and beta-blocker.

## 2018-05-31 IMAGING — DX DG PORTABLE PELVIS
1 series · 1 of 1 positions shown · non-contrast
Comparison: 09/29/2013 pelvic radiograph

CLINICAL DATA: Interval right total hip arthroplasty

EXAM:
PORTABLE PELVIS 1-2 VIEWS

[pelvis ap]
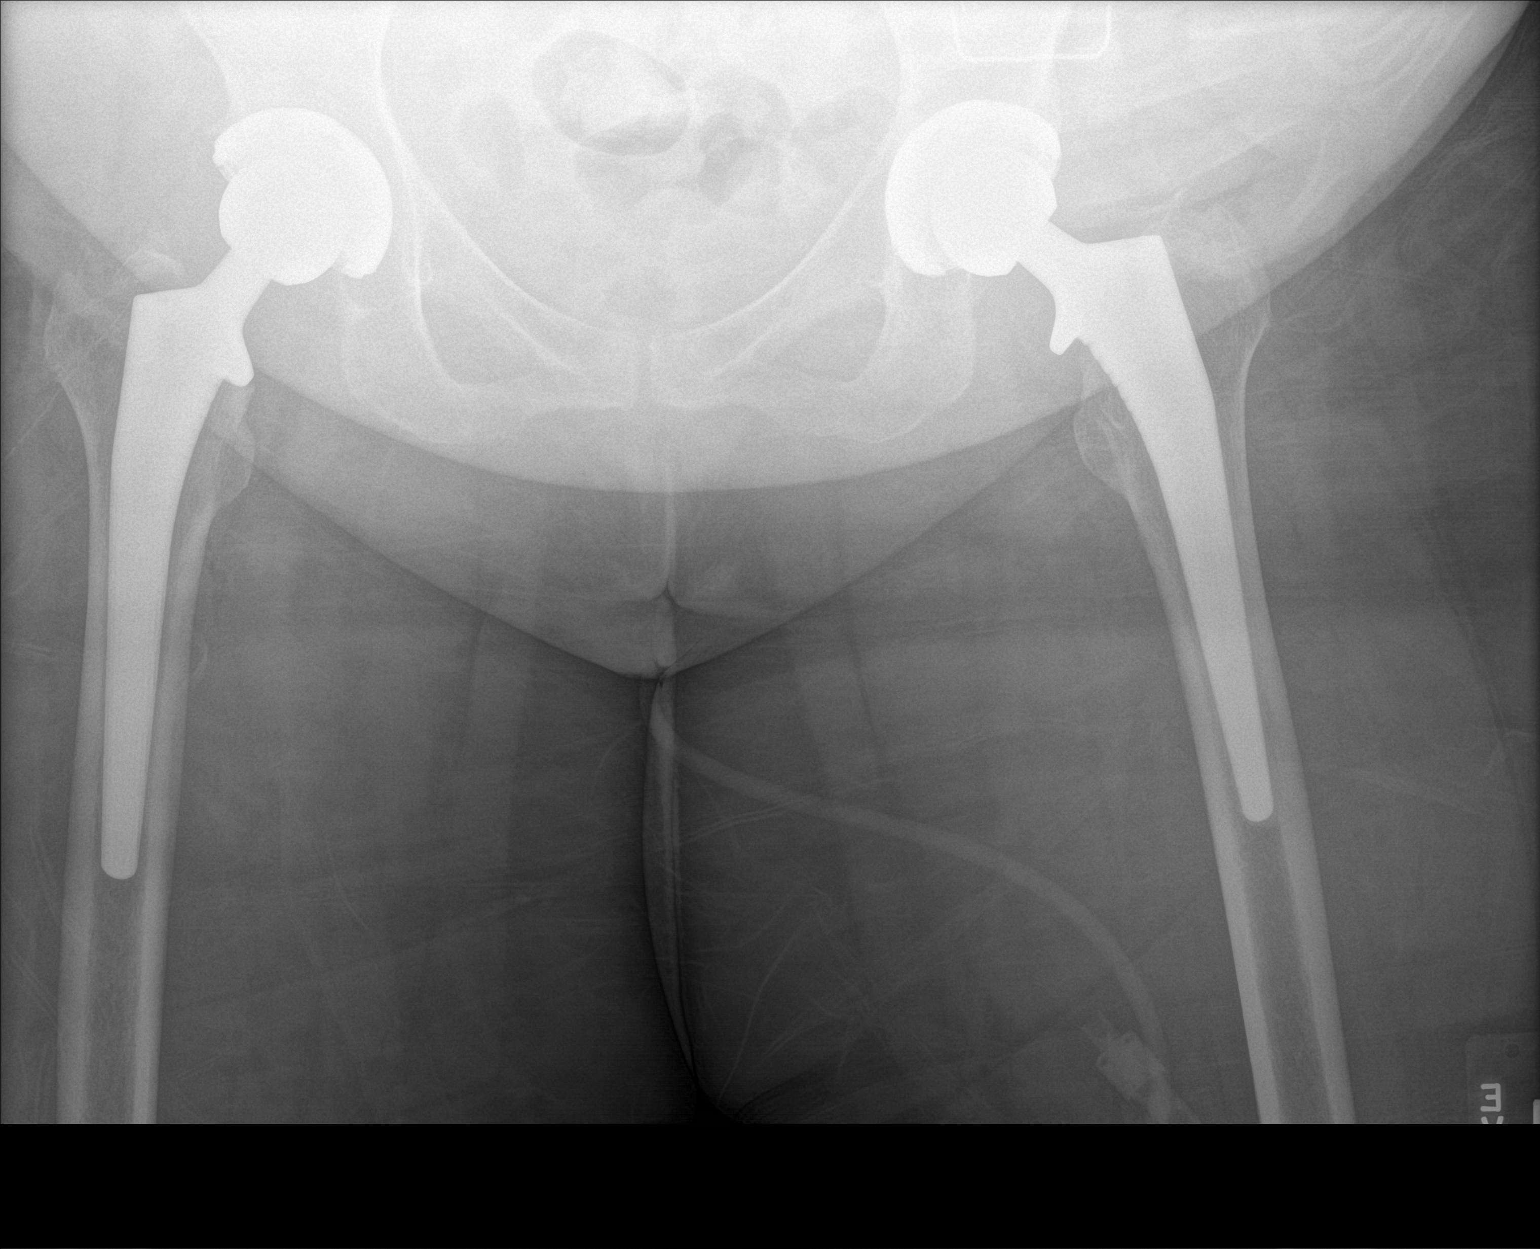

[1 of 1 positions shown; findings below may reference images not displayed]

FINDINGS: Interval right total hip arthroplasty, with well-positioned
prostheses. Previous left total hip arthroplasty. No evidence of hip
dislocation on this single frontal view. No osseous fracture. No
suspicious focal osseous lesions. Expected postoperative gas
surrounding the right hip joint.
IMPRESSION: Satisfactory single frontal view immediate postoperative appearance
status post right total hip arthroplasty.

## 2018-08-19 ENCOUNTER — Other Ambulatory Visit: Payer: Self-pay | Admitting: Pharmacist Clinician (PhC)/ Clinical Pharmacy Specialist

## 2018-08-19 MED ORDER — AMLODIPINE BESYLATE 5 MG PO TABS: 5.0000 mg | ORAL_TABLET | Freq: Every day | ORAL | 6 refills | Status: DC

## 2018-09-01 ENCOUNTER — Other Ambulatory Visit: Payer: Self-pay

## 2018-09-01 ENCOUNTER — Encounter: Payer: Self-pay | Admitting: Internal Medicine

## 2018-09-01 ENCOUNTER — Ambulatory Visit (INDEPENDENT_AMBULATORY_CARE_PROVIDER_SITE_OTHER): Payer: Medicare HMO | Admitting: Internal Medicine

## 2018-09-01 DIAGNOSIS — E210 Primary hyperparathyroidism: Secondary | ICD-10-CM

## 2018-09-01 DIAGNOSIS — M810 Age-related osteoporosis without current pathological fracture: Secondary | ICD-10-CM | POA: Diagnosis not present

## 2018-09-01 DIAGNOSIS — E559 Vitamin D deficiency, unspecified: Secondary | ICD-10-CM

## 2018-09-01 NOTE — Patient Instructions (Signed)
Please stop at the lab.  Continue current vitamin D supplement.  Please asked Dr. Marla Roe to check another vitamin D level when you see him at the end of the month.  Please come back for a follow-up appointment in 1 year.

## 2018-09-01 NOTE — Progress Notes (Signed)
Doxy link invite sent.  LM for patient to let her know she must have [employer] Chrome on her phone and to click the link 10 minutes before her appointment.
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
## 2018-10-05 DIAGNOSIS — E7800 Pure hypercholesterolemia, unspecified: Secondary | ICD-10-CM | POA: Diagnosis not present

## 2018-10-05 DIAGNOSIS — Z79899 Other long term (current) drug therapy: Secondary | ICD-10-CM | POA: Diagnosis not present

## 2018-10-05 DIAGNOSIS — E210 Primary hyperparathyroidism: Secondary | ICD-10-CM | POA: Diagnosis not present

## 2018-10-05 DIAGNOSIS — E559 Vitamin D deficiency, unspecified: Secondary | ICD-10-CM | POA: Diagnosis not present

## 2018-10-05 DIAGNOSIS — E039 Hypothyroidism, unspecified: Secondary | ICD-10-CM | POA: Diagnosis not present

## 2018-10-05 DIAGNOSIS — R7303 Prediabetes: Secondary | ICD-10-CM | POA: Diagnosis not present

## 2018-10-05 LAB — TSH: TSH: 4.53 (ref 0.41–5.90)

## 2018-10-06 ENCOUNTER — Encounter: Payer: Self-pay | Admitting: Internal Medicine

## 2018-10-07 ENCOUNTER — Encounter: Payer: Self-pay | Admitting: Internal Medicine

## 2018-10-07 NOTE — Progress Notes (Unsigned)
Received labs from PCP from 10/05/2018 -will scan reports: -TSH 4.53 (0.34-4.5) -HbA1c 6.1% -CBC normal with a slightly low red blood cell count and hematocrit but normal hemoglobin -CMP normal with the exception of glucose 118.  BUN/creatinine 20/0.62, GFR 96 -Lipids: 159/76/77/67  Slightly elevated TSH.  We will need to keep an eye on this for now.

## 2018-10-09 ENCOUNTER — Encounter: Payer: Self-pay | Admitting: Internal Medicine

## 2018-10-09 DIAGNOSIS — Z0000 Encounter for general adult medical examination without abnormal findings: Secondary | ICD-10-CM | POA: Diagnosis not present

## 2018-10-09 DIAGNOSIS — I10 Essential (primary) hypertension: Secondary | ICD-10-CM | POA: Diagnosis not present

## 2018-10-09 DIAGNOSIS — E892 Postprocedural hypoparathyroidism: Secondary | ICD-10-CM | POA: Diagnosis not present

## 2018-10-09 DIAGNOSIS — E210 Primary hyperparathyroidism: Secondary | ICD-10-CM | POA: Diagnosis not present

## 2018-10-09 DIAGNOSIS — E559 Vitamin D deficiency, unspecified: Secondary | ICD-10-CM | POA: Diagnosis not present

## 2018-10-09 DIAGNOSIS — R7303 Prediabetes: Secondary | ICD-10-CM | POA: Diagnosis not present

## 2018-10-09 DIAGNOSIS — I519 Heart disease, unspecified: Secondary | ICD-10-CM | POA: Diagnosis not present

## 2018-10-09 DIAGNOSIS — Z96649 Presence of unspecified artificial hip joint: Secondary | ICD-10-CM | POA: Diagnosis not present

## 2018-10-09 DIAGNOSIS — N200 Calculus of kidney: Secondary | ICD-10-CM | POA: Diagnosis not present

## 2018-12-08 ENCOUNTER — Other Ambulatory Visit: Payer: Self-pay | Admitting: Cardiology

## 2018-12-23 ENCOUNTER — Other Ambulatory Visit: Payer: Self-pay | Admitting: Cardiology

## 2018-12-24 NOTE — Telephone Encounter (Signed)
Amlodipine replaced ARB.  Glenetta Hew, MD

## 2019-05-26 ENCOUNTER — Encounter: Payer: Self-pay | Admitting: Cardiology

## 2019-05-26 ENCOUNTER — Ambulatory Visit (INDEPENDENT_AMBULATORY_CARE_PROVIDER_SITE_OTHER): Payer: Medicare HMO | Admitting: Cardiology

## 2019-05-26 ENCOUNTER — Other Ambulatory Visit: Payer: Self-pay

## 2019-05-26 VITALS — BP 124/72 | HR 72 | Temp 97.2°F | Ht 64.0 in | Wt 246.8 lb

## 2019-05-26 DIAGNOSIS — E782 Mixed hyperlipidemia: Secondary | ICD-10-CM | POA: Diagnosis not present

## 2019-05-26 DIAGNOSIS — I493 Ventricular premature depolarization: Secondary | ICD-10-CM | POA: Diagnosis not present

## 2019-05-26 DIAGNOSIS — I10 Essential (primary) hypertension: Secondary | ICD-10-CM | POA: Diagnosis not present

## 2019-05-26 DIAGNOSIS — Z8249 Family history of ischemic heart disease and other diseases of the circulatory system: Secondary | ICD-10-CM | POA: Insufficient documentation

## 2019-05-26 DIAGNOSIS — E669 Obesity, unspecified: Secondary | ICD-10-CM
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
## 2019-05-26 NOTE — Patient Instructions (Signed)
Medication Instructions:  NO CHANGES  *If you need a refill on your cardiac medications before your next appointment, please call your pharmacy*  Lab Work: NOT NEEDED  Testing/Procedures: WILL BE AT [facility] Your physician has requested that you have an abdominal aorta duplex. During this test, an ultrasound is used to evaluate the aorta. Allow 30 minutes for this exam. Do not eat after midnight the day before and avoid carbonated beverages  Follow-Up: At [facility], you and your health needs are our priority.  As part of our continuing mission to provide you with exceptional heart care, we have created designated Provider Care Teams.  These Care Teams include your primary Cardiologist (physician) and Advanced Practice Providers (APPs -  Physician Assistants and Nurse Practitioners) who all work together to provide you with the care you need, when you need it.  Your next appointment:   12 month(s)  The format for your next appointment:   In Person  Provider:   Glenetta Hew, MD

## 2019-05-28 ENCOUNTER — Other Ambulatory Visit (HOSPITAL_COMMUNITY): Payer: Medicare HMO

## 2019-05-30 ENCOUNTER — Encounter: Payer: Self-pay | Admitting: Cardiology

## 2019-05-30 NOTE — Assessment & Plan Note (Signed)
Blood pressure looks pretty good today on acebutolol and amlodipine. Edema seems to be better than it had been. No current diuretic requirement.

## 2019-05-30 NOTE — Assessment & Plan Note (Signed)
Well-controlled on current dose of acebutolol. Seems to be tolerating acebutolol better than the beta-blockers.

## 2019-05-30 NOTE — Assessment & Plan Note (Signed)
Based on labs just checked by PCP. Well-controlled on current dose of atorvastatin.

## 2019-05-30 NOTE — Assessment & Plan Note (Signed)
She is concerned about her father's AAA but she was not aware of before. Apparently he had initial repair in his 75s and apparently had rupture of AAA in his 14s.  Plan: Check abdominal aortic Doppler.

## 2019-05-30 NOTE — Assessment & Plan Note (Signed)
We discussed the importance of trying to get back down to a reasonable weight of less than 200 pounds. This will take probably a couple years worth of adjusted diet and exercise.

## 2019-06-02 ENCOUNTER — Ambulatory Visit (HOSPITAL_COMMUNITY)
Admission: RE | Admit: 2019-06-02 | Discharge: 2019-06-02 | Disposition: A | Discharge status: Home / Self Care | Payer: Medicare HMO | Source: Ambulatory Visit | Attending: Cardiology | Admitting: Cardiology

## 2019-06-02 ENCOUNTER — Other Ambulatory Visit: Payer: Self-pay

## 2019-06-02 ENCOUNTER — Other Ambulatory Visit: Payer: Self-pay | Admitting: Cardiology

## 2019-06-02 DIAGNOSIS — Z8249 Family history of ischemic heart disease and other diseases of the circulatory system: Secondary | ICD-10-CM | POA: Insufficient documentation

## 2019-06-02 DIAGNOSIS — I10 Essential (primary) hypertension: Secondary | ICD-10-CM | POA: Insufficient documentation

## 2019-08-31 ENCOUNTER — Other Ambulatory Visit: Payer: Self-pay

## 2019-09-02 ENCOUNTER — Other Ambulatory Visit: Payer: Self-pay

## 2019-09-02 ENCOUNTER — Encounter: Payer: Self-pay | Admitting: Internal Medicine

## 2019-09-02 ENCOUNTER — Ambulatory Visit (INDEPENDENT_AMBULATORY_CARE_PROVIDER_SITE_OTHER): Payer: Medicare HMO | Admitting: Internal Medicine

## 2019-09-02 VITALS — BP 130/80 | HR 78 | Ht 64.0 in | Wt 242.0 lb

## 2019-09-02 DIAGNOSIS — E210 Primary hyperparathyroidism: Secondary | ICD-10-CM

## 2019-09-02 DIAGNOSIS — M810 Age-related osteoporosis without current pathological fracture: Secondary | ICD-10-CM

## 2019-09-02 DIAGNOSIS — E559 Vitamin D deficiency, unspecified: Secondary | ICD-10-CM

## 2019-09-02 DIAGNOSIS — R7989 Other specified abnormal findings of blood chemistry: Secondary | ICD-10-CM | POA: Diagnosis not present

## 2019-09-02 LAB — VITAMIN D 25 HYDROXY (VIT D DEFICIENCY, FRACTURES): VITD: 52.18 ng/mL (ref 30.00–100.00)

## 2019-09-02 LAB — TSH: TSH: 3.42 u[IU]/mL (ref 0.35–4.50)

## 2019-09-02 LAB — T4, FREE: Free T4: 0.8 ng/dL (ref 0.60–1.60)

## 2019-09-02 LAB — T3, FREE: T3, Free: 3.4 pg/mL (ref 2.3–4.2)

## 2019-09-02 NOTE — Patient Instructions (Addendum)
Please ask Dr. Stephanie Acre to order a new Bone density on the Eagle machine (add a 33% distal radius).  Continue 10,000 units vitamin D daily for now.  Please stop at the lab.  Please come back for a follow-up appointment in 1 year.
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
## 2019-09-05 ENCOUNTER — Encounter: Payer: Self-pay | Admitting: Internal Medicine

## 2019-09-16 ENCOUNTER — Encounter: Payer: Self-pay | Admitting: Internal Medicine

## 2019-09-17 ENCOUNTER — Other Ambulatory Visit: Payer: Self-pay | Admitting: Internal Medicine

## 2019-09-17 DIAGNOSIS — M810 Age-related osteoporosis without current pathological fracture: Secondary | ICD-10-CM

## 2019-09-17 DIAGNOSIS — E210 Primary hyperparathyroidism: Secondary | ICD-10-CM

## 2019-09-17 NOTE — Progress Notes (Signed)
dexa

## 2019-09-23 ENCOUNTER — Ambulatory Visit (INDEPENDENT_AMBULATORY_CARE_PROVIDER_SITE_OTHER)
Admission: RE | Admit: 2019-09-23 | Discharge: 2019-09-23 | Disposition: A | Discharge status: Home / Self Care | Payer: Medicare HMO | Source: Ambulatory Visit | Attending: Internal Medicine | Admitting: Internal Medicine

## 2019-09-23 ENCOUNTER — Other Ambulatory Visit: Payer: Self-pay

## 2019-09-23 DIAGNOSIS — M810 Age-related osteoporosis without current pathological fracture: Secondary | ICD-10-CM | POA: Diagnosis not present

## 2019-09-23 DIAGNOSIS — E210 Primary hyperparathyroidism: Secondary | ICD-10-CM | POA: Diagnosis not present

## 2019-09-27 ENCOUNTER — Encounter: Payer: Self-pay | Admitting: Internal Medicine

## 2020-03-17 ENCOUNTER — Other Ambulatory Visit: Payer: Self-pay | Admitting: Cardiology

## 2020-05-20 ENCOUNTER — Other Ambulatory Visit: Payer: Self-pay | Admitting: Cardiology

## 2020-06-06 ENCOUNTER — Ambulatory Visit: Payer: Medicare HMO | Admitting: Cardiology

## 2020-06-27 ENCOUNTER — Encounter: Payer: Self-pay | Admitting: Cardiology

## 2020-06-27 ENCOUNTER — Ambulatory Visit (INDEPENDENT_AMBULATORY_CARE_PROVIDER_SITE_OTHER): Payer: Medicare HMO | Admitting: Cardiology

## 2020-06-27 ENCOUNTER — Other Ambulatory Visit: Payer: Self-pay

## 2020-06-27 VITALS — BP 168/94 | HR 68 | Ht 64.0 in | Wt 224.0 lb

## 2020-06-27 DIAGNOSIS — I493 Ventricular premature depolarization: Secondary | ICD-10-CM | POA: Diagnosis not present

## 2020-06-27 DIAGNOSIS — I10 Essential (primary) hypertension: Secondary | ICD-10-CM | POA: Diagnosis not present

## 2020-06-27 DIAGNOSIS — E782 Mixed hyperlipidemia: Secondary | ICD-10-CM | POA: Diagnosis not present

## 2020-06-27 DIAGNOSIS — Z8249 Family history of ischemic heart disease and other diseases of the circulatory system: Secondary | ICD-10-CM

## 2020-06-27 DIAGNOSIS — E669 Obesity, unspecified: Secondary | ICD-10-CM

## 2020-06-27 MED ORDER — VALSARTAN 40 MG PO TABS: 40.0000 mg | ORAL_TABLET | Freq: Every day | ORAL | 6 refills | Status: DC

## 2020-06-27 NOTE — Patient Instructions (Signed)
Medication Instructions:  Stop amlodipine   Start Valsartan 40 mg one tablet daily   *If you need a refill on your cardiac medications before your next appointment, please call your pharmacy*   Lab Work: Not needed .   Testing/Procedures:  Not needed  Follow-Up: At [facility], you and your health needs are our priority.  As part of our continuing mission to provide you with exceptional heart care, we have created designated Provider Care Teams.  These Care Teams include your primary Cardiologist (physician) and Advanced Practice Providers (APPs -  Physician Assistants and Nurse Practitioners) who all work together to provide you with the care you need, when you need it.     Your next appointment:   6 month(s)  The format for your next appointment:   In Person  Provider:   Glenetta Hew, MD   Other Instruction Your physician recommends that you schedule a follow-up appointment in:  One month with [facility] for blood pressure
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
## 2020-07-11 MED ORDER — VALSARTAN 80 MG PO TABS: 80.0000 mg | ORAL_TABLET | Freq: Every day | ORAL | 3 refills | Status: DC

## 2020-07-11 NOTE — Telephone Encounter (Signed)
Called patient left message on personal voice mail to call back to discuss what pharmacist has recommended.

## 2020-07-11 NOTE — Telephone Encounter (Signed)
Please have patient increase valsartan to 80 mg once daily.  Keep PharmD scheduled appointment and bring home BP cuff/meter as well as daily readings.

## 2020-07-12 ENCOUNTER — Encounter: Payer: Self-pay | Admitting: Cardiology

## 2020-07-12 NOTE — Assessment & Plan Note (Signed)
Normal aortic Doppler.  No evidence of aneurysm.

## 2020-07-12 NOTE — Assessment & Plan Note (Signed)
Hopefully she can get her psychological issues resolved and then she can get back and exercise good I tried explained to her that maybe getting out and doing her exercise routines will help her level of anxiety and depression.  Also talked about "comfort foods", and very bad "side effects ".

## 2020-07-12 NOTE — Assessment & Plan Note (Signed)
[facility] evaluation of normal echocardiogram in the past.  Well-controlled on acebutolol.  This has led to less fatigue and depression type symptoms than other beta-blockers did.

## 2020-07-12 NOTE — Assessment & Plan Note (Signed)
For her risk factors, lipids are pretty well controlled on current dose of atorvastatin.  If she continues to lose weight, this should be adequate, however if not she may need to titrate up further.  Defer to PCP.

## 2020-07-12 NOTE — Assessment & Plan Note (Signed)
Previously well controlled amlodipine and acebutolol.  However, she is not sure how well she tolerates amlodipine.  I asked about potentially titrating that up and she did not seem happy about that option.  Plan: DC amlodipine, start valsartan 40 mg daily She will monitor her blood pressures at home and follow-up with [facility] in a month or so for blood pressure evaluation. 0-> anticipate titrating up ARB as well as possibly adding diuretic

## 2020-07-25 ENCOUNTER — Ambulatory Visit (INDEPENDENT_AMBULATORY_CARE_PROVIDER_SITE_OTHER): Payer: Medicare HMO | Admitting: Pharmacist Clinician (PhC)/ Clinical Pharmacy Specialist

## 2020-07-25 ENCOUNTER — Other Ambulatory Visit: Payer: Self-pay

## 2020-07-25 VITALS — BP 142/84 | HR 66 | Resp 16 | Ht 64.0 in

## 2020-07-25 DIAGNOSIS — I10 Essential (primary) hypertension: Secondary | ICD-10-CM | POA: Diagnosis not present

## 2020-07-25 NOTE — Patient Instructions (Signed)
Return for a a follow up appointment in April 21 at 11 am  Check your blood pressure at home daily (if able) and keep record of the readings.  Take your BP meds as follows:  Continue with all current medications.    Bring all of your meds, your BP cuff and your record of home blood pressures to your next appointment.  Exercise as you're able, try to walk approximately 30 minutes per day.  Keep salt intake to a minimum, especially watch canned and prepared boxed foods.  Eat more fresh fruits and vegetables and fewer canned items.  Avoid eating in fast food restaurants.    HOW TO TAKE YOUR BLOOD PRESSURE: . Rest 5 minutes before taking your blood pressure. .  Don't smoke or drink caffeinated beverages for at least 30 minutes before. . Take your blood pressure before (not after) you eat. . Sit comfortably with your back supported and both feet on the floor (don't cross your legs). . Elevate your arm to heart level on a table or a desk. . Use the proper sized cuff. It should fit smoothly and snugly around your bare upper arm. There should be enough room to slip a fingertip under the cuff. The bottom edge of the cuff should be 1 inch above the crease of the elbow. . Ideally, take 3 measurements at one sitting and record the average.

## 2020-07-25 NOTE — Assessment & Plan Note (Signed)
Patient with essential hypertension, doing better with the valsartan 80 mg dose.  We spent some time working on proper technique for wrist monitor.  Although her reading is elevated in the office today, her home average is slightly lower.  Will have her continue to work on lifestyle modifications and proper BP checks twice daily, at least 3-4 days each week.  We will see her back on the office in 6 weeks for follow up.
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
## 2020-08-04 ENCOUNTER — Telehealth: Payer: Self-pay | Admitting: Cardiology

## 2020-08-04 NOTE — Telephone Encounter (Addendum)
I called the pt re: her My Chart message and call from today:   She has reported:    What are your last 5 BP readings? 96/56 HR 99, patient took it again and 107/58 hr 100,next one 92/58 HR 101 (all taken within minutes of eachother last night)   She says that her BP has been running 120-140/ 80's but she does not have specific readings to offer me.. her HR has been in the 70's.   Last night she had a few glasses of wine with dinner but had hydrated well during the day prior.So she does not think this was an issue for her BP readings.   She noticed at 12:30 am that her BP was 98/59 and HR 99 so she held her Valsartan which she always takes at night.   Her BP when she got up this morning was 145/89 and HR 62 so she took her Acebutolol and Valsartan and she says she is feeling well except tired. I asked her to increase her fluids this morning and recheck her BP.   She denies any changes to her meds, diet, over all health.   I advised her to continue to monitor.   She will let us know if she has any further problems and will keep a diary of her BP readings.   Will forward to the HTN clinic or review.

## 2020-08-04 NOTE — Telephone Encounter (Signed)
I spoke with the pt in response to her My Chart message her her BP please see My Chart encounter.. will close this call.

## 2020-08-04 NOTE — Telephone Encounter (Signed)
Pt c/o BP issue: STAT if pt c/o blurred vision, one-sided weakness or slurred speech  1. What are your last 5 BP readings? 96/56 HR 99, patient took it again and 107/58 hr 100,next one 92/58 HR 101  2. Are you having any other symptoms (ex. Dizziness, headache, blurred vision, passed out)? Today tired   3. What is your BP issue? That her BP going down and patient feel flurrying.

## 2020-08-31 ENCOUNTER — Ambulatory Visit (INDEPENDENT_AMBULATORY_CARE_PROVIDER_SITE_OTHER): Payer: Medicare HMO | Admitting: Internal Medicine

## 2020-08-31 ENCOUNTER — Other Ambulatory Visit: Payer: Self-pay

## 2020-08-31 ENCOUNTER — Encounter: Payer: Self-pay | Admitting: Internal Medicine

## 2020-08-31 VITALS — BP 128/82 | HR 66 | Ht 64.0 in | Wt 225.8 lb

## 2020-08-31 DIAGNOSIS — R7989 Other specified abnormal findings of blood chemistry: Secondary | ICD-10-CM | POA: Diagnosis not present

## 2020-08-31 DIAGNOSIS — E210 Primary hyperparathyroidism: Secondary | ICD-10-CM | POA: Diagnosis not present

## 2020-08-31 DIAGNOSIS — E559 Vitamin D deficiency, unspecified: Secondary | ICD-10-CM | POA: Diagnosis not present

## 2020-08-31 DIAGNOSIS — M810 Age-related osteoporosis without current pathological fracture: Secondary | ICD-10-CM
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
## 2020-08-31 NOTE — Patient Instructions (Signed)
Please stop at the lab.  Please continue vitamin D 10,000 units daily.  Please come back for a follow-up appointment in 1 year.

## 2020-09-01 ENCOUNTER — Ambulatory Visit: Payer: Medicare HMO | Admitting: Internal Medicine

## 2020-09-01 LAB — VITAMIN D 25 HYDROXY (VIT D DEFICIENCY, FRACTURES): Vit D, 25-Hydroxy: 54.9 ng/mL (ref 30.0–100.0)

## 2020-09-07 ENCOUNTER — Ambulatory Visit: Payer: Medicare HMO
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
## 2020-10-09 ENCOUNTER — Ambulatory Visit: Payer: Medicare HMO
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
## 2020-10-13 ENCOUNTER — Other Ambulatory Visit: Payer: Self-pay

## 2020-10-13 ENCOUNTER — Ambulatory Visit (INDEPENDENT_AMBULATORY_CARE_PROVIDER_SITE_OTHER): Payer: Medicare HMO | Admitting: Pharmacist

## 2020-10-13 VITALS — BP 143/86 | HR 64

## 2020-10-13 DIAGNOSIS — I10 Essential (primary) hypertension: Secondary | ICD-10-CM | POA: Diagnosis not present

## 2020-10-13 NOTE — Patient Instructions (Signed)
Please increase your exercise. Goal would be 4-5 days a week  Please continue to check your blood pressure.  Call us if you blood pressure starts running >150/80   [phone]
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
## 2020-10-26 ENCOUNTER — Telehealth: Payer: Self-pay | Admitting: Internal Medicine

## 2020-10-26 ENCOUNTER — Encounter: Payer: Self-pay | Admitting: Internal Medicine

## 2020-10-26 ENCOUNTER — Other Ambulatory Visit: Payer: Self-pay | Admitting: Family Medicine

## 2020-10-26 DIAGNOSIS — Z1231 Encounter for screening mammogram for malignant neoplasm of breast: Secondary | ICD-10-CM

## 2020-10-26 NOTE — Telephone Encounter (Signed)
Pt called and states that she Had  physical and Primary care provider voiced to patient that she was eligible for a bone scan if Dr. Cruzita Lederer is okay with it.

## 2020-10-27 ENCOUNTER — Other Ambulatory Visit: Payer: Self-pay | Admitting: Internal Medicine

## 2020-10-27 DIAGNOSIS — E210 Primary hyperparathyroidism: Secondary | ICD-10-CM

## 2020-10-27 DIAGNOSIS — M810 Age-related osteoporosis without current pathological fracture: Secondary | ICD-10-CM

## 2020-10-27 NOTE — Telephone Encounter (Signed)
Please advise 

## 2020-10-31 ENCOUNTER — Other Ambulatory Visit: Payer: Self-pay | Admitting: Internal Medicine

## 2020-10-31 DIAGNOSIS — M810 Age-related osteoporosis without current pathological fracture: Secondary | ICD-10-CM

## 2020-10-31 DIAGNOSIS — E210 Primary hyperparathyroidism: Secondary | ICD-10-CM

## 2020-10-31 NOTE — Telephone Encounter (Signed)
Called and unable to leave a message Pt notified via MyChart message.

## 2020-11-08 ENCOUNTER — Other Ambulatory Visit: Payer: Self-pay | Admitting: Cardiology

## 2020-11-10 ENCOUNTER — Ambulatory Visit: Payer: Medicare HMO

## 2020-11-21 ENCOUNTER — Other Ambulatory Visit: Payer: Self-pay | Admitting: Cardiology

## 2020-11-21 NOTE — Progress Notes (Signed)
LAB RESULTS 10/17/2020 Na+ 140, K+ 4.4, Cl- 104, HCO3-31, BUN 17, Cr 0.73, Glu 113, Ca2+ 9.6; AST 14, ALT 12, AlkP 56 CBC: W 5.4, H/H 12.5/37.5, Plt 319; A1c 6.2; TSH 2.98 TC 261, TG 93, HDL 80, LDLcalc 166  LDL doubled from last check.  Glenetta Hew, MD

## 2020-12-14 ENCOUNTER — Ambulatory Visit (INDEPENDENT_AMBULATORY_CARE_PROVIDER_SITE_OTHER): Payer: Medicare HMO | Admitting: Pharmacist

## 2020-12-14 ENCOUNTER — Other Ambulatory Visit: Payer: Self-pay

## 2020-12-14 ENCOUNTER — Telehealth: Payer: Self-pay | Admitting: Cardiology

## 2020-12-14 VITALS — BP 154/96 | HR 75

## 2020-12-14 DIAGNOSIS — I10 Essential (primary) hypertension: Secondary | ICD-10-CM | POA: Diagnosis not present

## 2020-12-14 NOTE — Patient Instructions (Addendum)
It was nice to meet you today!  Your blood pressure goal is < 130/47mHg  Increase your valsartan from '80mg'$  to '160mg'$    Continue to monitor your blood pressure at home and record your readings  Bring your home log and blood pressure cuff to your next visit in 4 weeks  Call MJinny Blossom PharmD with any blood pressure concerns before then

## 2020-12-14 NOTE — Telephone Encounter (Signed)
Patient has come into the office for her pharmacy visit with [facility]. Patient would like to transition care to our office out of convenience. She would prefer Dr Harrington Challenger to take over her care, only Dr Harrington Challenger if this ok, please advise.

## 2020-12-14 NOTE — Telephone Encounter (Signed)
Perfectly fine with me
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
## 2020-12-17 ENCOUNTER — Other Ambulatory Visit: Payer: Self-pay | Admitting: Cardiology

## 2020-12-21 ENCOUNTER — Other Ambulatory Visit: Payer: Self-pay

## 2020-12-21 ENCOUNTER — Ambulatory Visit
Admission: RE | Admit: 2020-12-21 | Discharge: 2020-12-21 | Disposition: A | Discharge status: Home / Self Care | Payer: Medicare HMO | Source: Ambulatory Visit | Attending: Family Medicine | Admitting: Family Medicine

## 2020-12-21 ENCOUNTER — Inpatient Hospital Stay: Admission: RE | Admit: 2020-12-21 | Payer: Medicare HMO | Source: Ambulatory Visit

## 2020-12-21 DIAGNOSIS — Z1231 Encounter for screening mammogram for malignant neoplasm of breast: Secondary | ICD-10-CM

## 2021-01-04 ENCOUNTER — Telehealth: Payer: Self-pay | Admitting: Cardiology

## 2021-01-04 MED ORDER — ACEBUTOLOL HCL 200 MG PO CAPS: 200.0000 mg | ORAL_CAPSULE | Freq: Two times a day (BID) | ORAL | 0 refills | Status: DC

## 2021-01-04 MED ORDER — VALSARTAN 80 MG PO TABS: 160.0000 mg | ORAL_TABLET | Freq: Every day | ORAL | 0 refills | Status: DC

## 2021-01-04 NOTE — Telephone Encounter (Signed)
*  STAT* If patient is at the pharmacy, call can be transferred to refill team.   1. Which medications need to be refilled? (please list name of each medication and dose if known)  acebutolol (SECTRAL) 200 MG capsule  valsartan (DIOVAN) 80 MG tablet  2. Which pharmacy/location (including street and city if local pharmacy) is medication to be sent to?   [id number]- [place], [address]  3. Do they need a 30 day or 90 day supply? 5 ds   Pt c/o medication issue:  1. Name of Medication:  acebutolol (SECTRAL) 200 MG capsule  valsartan (DIOVAN) 80 MG tablet  2. How are you currently taking this medication (dosage and times per day)?  acebutolol (SECTRAL) 200 MG capsule TAKE 1 CAPSULE BY MOUTH TWICE A DAY   valsartan (DIOVAN) 80 MG tablet Take 2 tablets (160 mg total) by mouth daily.    3. Are you having a reaction (difficulty breathing--STAT)? no  4. What is your medication issue? Pt is out of town and is needing a 5 day supply to last until she gets back home... please advise

## 2021-01-09 ENCOUNTER — Ambulatory Visit (INDEPENDENT_AMBULATORY_CARE_PROVIDER_SITE_OTHER): Payer: Medicare HMO | Admitting: Pharmacist

## 2021-01-09 ENCOUNTER — Other Ambulatory Visit: Payer: Self-pay

## 2021-01-09 VITALS — BP 152/88 | HR 75

## 2021-01-09 DIAGNOSIS — I10 Essential (primary) hypertension: Secondary | ICD-10-CM | POA: Diagnosis not present

## 2021-01-09 MED ORDER — VALSARTAN 80 MG PO TABS: 80.0000 mg | ORAL_TABLET | Freq: Two times a day (BID) | ORAL | 3 refills | Status: DC
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
## 2021-01-09 NOTE — Patient Instructions (Addendum)
It was nice to see you today  Your blood pressure goal is < 130/82mHg  Continue taking valsartan '80mg'$  twice daily and acebutolol '200mg'$  twice daily  Resume your mindfulness activities, walking, and low sodium diet  Call MJinny Blossom PharmD in clinic with any blood pressure concerns - #[phone]

## 2021-01-10 LAB — BASIC METABOLIC PANEL
BUN/Creatinine Ratio: 26 (ref 12–28)
BUN: 17 mg/dL (ref 8–27)
CO2: 25 mmol/L (ref 20–29)
Calcium: 10.1 mg/dL (ref 8.7–10.3)
Chloride: 103 mmol/L (ref 96–106)
Creatinine, Ser: 0.65 mg/dL (ref 0.57–1.00)
Glucose: 115 mg/dL — ABNORMAL HIGH (ref 65–99)
Potassium: 5.2 mmol/L (ref 3.5–5.2)
Sodium: 141 mmol/L (ref 134–144)
eGFR: 95 mL/min/{1.73_m2} (ref >59)

## 2021-03-30 ENCOUNTER — Other Ambulatory Visit: Payer: Self-pay

## 2021-03-30 ENCOUNTER — Ambulatory Visit (INDEPENDENT_AMBULATORY_CARE_PROVIDER_SITE_OTHER): Payer: Medicare HMO | Admitting: Internal Medicine

## 2021-03-30 ENCOUNTER — Encounter: Payer: Self-pay | Admitting: Internal Medicine

## 2021-03-30 VITALS — BP 150/86 | HR 67 | Ht 64.5 in | Wt 245.0 lb

## 2021-03-30 DIAGNOSIS — I493 Ventricular premature depolarization: Secondary | ICD-10-CM

## 2021-03-30 NOTE — Patient Instructions (Addendum)
Medication Instructions:   Your physician recommends that you continue on your current medications as directed. Please refer to the Current Medication list given to you today.   *If you need a refill on your cardiac medications before your next appointment, please call your pharmacy*   Lab Work:  BMET AND LIPID   If you have labs (blood work) drawn today and your tests are completely normal, you will receive your results only by: [place] (if you have MyChart) OR A paper copy in the mail If you have any lab test that is abnormal or we need to change your treatment, we will call you to review the results.   Testing/Procedures: NONE ORDERED  TODAY     Follow-Up: At [facility], you and your health needs are our priority.  As part of our continuing mission to provide you with exceptional heart care, we have created designated Provider Care Teams.  These Care Teams include your primary Cardiologist (physician) and Advanced Practice Providers (APPs -  Physician Assistants and Nurse Practitioners) who all work together to provide you with the care you need, when you need it.  We recommend signing up for the patient portal called "MyChart".  Sign up information is provided on this After Visit Summary.  MyChart is used to connect with patients for Virtual Visits (Telemedicine).  Patients are able to view lab/test results, encounter notes, upcoming appointments, etc.  Non-urgent messages can be sent to your provider as well.   To learn more about what you can do with MyChart, go to [email or web address].    Your next appointment:  IN [place]  1 month(s)  The format for your next appointment:   In Person  Provider:   Dorris Carnes, MD{   Other Instructions
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
## 2021-03-31 LAB — BASIC METABOLIC PANEL
BUN/Creatinine Ratio: 15 (ref 12–28)
BUN: 10 mg/dL (ref 8–27)
CO2: 26 mmol/L (ref 20–29)
Calcium: 9.9 mg/dL (ref 8.7–10.3)
Chloride: 101 mmol/L (ref 96–106)
Creatinine, Ser: 0.67 mg/dL (ref 0.57–1.00)
Glucose: 97 mg/dL (ref 70–99)
Potassium: 5.1 mmol/L (ref 3.5–5.2)
Sodium: 139 mmol/L (ref 134–144)
eGFR: 94 mL/min/{1.73_m2} (ref >59)

## 2021-03-31 LAB — LIPID PANEL
Chol/HDL Ratio: 2.3 ratio (ref 0.0–4.4)
Cholesterol, Total: 177 mg/dL (ref 100–199)
HDL: 77 mg/dL (ref >39)
LDL Chol Calc (NIH): 81 mg/dL (ref 0–99)
Triglycerides: 105 mg/dL (ref 0–149)
VLDL Cholesterol Cal: 19 mg/dL (ref 5–40)

## 2021-05-20 HISTORY — PX: CATARACT EXTRACTION: SUR2
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
## 2021-05-28 ENCOUNTER — Other Ambulatory Visit: Payer: Self-pay

## 2021-05-28 ENCOUNTER — Ambulatory Visit (INDEPENDENT_AMBULATORY_CARE_PROVIDER_SITE_OTHER): Payer: Medicare HMO | Admitting: Internal Medicine

## 2021-05-28 ENCOUNTER — Encounter: Payer: Self-pay | Admitting: Internal Medicine

## 2021-05-28 VITALS — BP 180/108 | HR 75 | Ht 64.0 in | Wt 235.0 lb

## 2021-05-28 DIAGNOSIS — I493 Ventricular premature depolarization: Secondary | ICD-10-CM

## 2021-05-28 MED ORDER — ACEBUTOLOL HCL 200 MG PO CAPS: 200.0000 mg | ORAL_CAPSULE | Freq: Two times a day (BID) | ORAL | 3 refills | Status: DC

## 2021-05-28 NOTE — Patient Instructions (Signed)
Medication Instructions:  Your physician recommends that you continue on your current medications as directed. Please refer to the Current Medication list given to you today.  *If you need a refill on your cardiac medications before your next appointment, please call your pharmacy*   Lab Work: NONE   If you have labs (blood work) drawn today and your tests are completely normal, you will receive your results only by: [place] (if you have MyChart) OR A paper copy in the mail If you have any lab test that is abnormal or we need to change your treatment, we will call you to review the results.   Testing/Procedures: NONE    Follow-Up: At [facility], you and your health needs are our priority.  As part of our continuing mission to provide you with exceptional heart care, we have created designated Provider Care Teams.  These Care Teams include your primary Cardiologist (physician) and Advanced Practice Providers (APPs -  Physician Assistants and Nurse Practitioners) who all work together to provide you with the care you need, when you need it.  We recommend signing up for the patient portal called "MyChart".  Sign up information is provided on this After Visit Summary.  MyChart is used to connect with patients for Virtual Visits (Telemedicine).  Patients are able to view lab/test results, encounter notes, upcoming appointments, etc.  Non-urgent messages can be sent to your provider as well.   To learn more about what you can do with MyChart, go to [email or web address].    Your next appointment:   7 month(s)  The format for your next appointment:   In Person  Provider:   Dorris Carnes, MD    Other Instructions Thank you for choosing [facility]!

## 2021-08-16 ENCOUNTER — Ambulatory Visit (INDEPENDENT_AMBULATORY_CARE_PROVIDER_SITE_OTHER)
Admission: RE | Admit: 2021-08-16 | Discharge: 2021-08-16 | Disposition: A | Discharge status: Home / Self Care | Payer: Medicare HMO | Source: Ambulatory Visit | Attending: Internal Medicine | Admitting: Internal Medicine

## 2021-08-16 DIAGNOSIS — M810 Age-related osteoporosis without current pathological fracture: Secondary | ICD-10-CM

## 2021-08-16 DIAGNOSIS — E210 Primary hyperparathyroidism: Secondary | ICD-10-CM | POA: Diagnosis not present

## 2021-08-31 ENCOUNTER — Encounter: Payer: Self-pay | Admitting: Internal Medicine

## 2021-08-31 ENCOUNTER — Ambulatory Visit (INDEPENDENT_AMBULATORY_CARE_PROVIDER_SITE_OTHER): Payer: Medicare HMO | Admitting: Internal Medicine

## 2021-08-31 VITALS — BP 138/90 | HR 71 | Ht 64.0 in | Wt 244.2 lb

## 2021-08-31 DIAGNOSIS — R7989 Other specified abnormal findings of blood chemistry: Secondary | ICD-10-CM | POA: Diagnosis not present

## 2021-08-31 DIAGNOSIS — E559 Vitamin D deficiency, unspecified: Secondary | ICD-10-CM | POA: Diagnosis not present

## 2021-08-31 DIAGNOSIS — E210 Primary hyperparathyroidism: Secondary | ICD-10-CM

## 2021-08-31 DIAGNOSIS — M810 Age-related osteoporosis without current pathological fracture: Secondary | ICD-10-CM

## 2021-08-31 LAB — VITAMIN D 25 HYDROXY (VIT D DEFICIENCY, FRACTURES): VITD: 43.62 ng/mL (ref 30.00–100.00)

## 2021-08-31 LAB — TSH: TSH: 2.89 u[IU]/mL (ref 0.35–5.50)
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
## 2021-08-31 NOTE — Patient Instructions (Signed)
Please stop at the lab. ? ?Please continue vitamin D 10,000 units daily. ? ?Please come back for a follow-up appointment in 1 year. ?

## 2021-12-17 ENCOUNTER — Other Ambulatory Visit: Payer: Self-pay | Admitting: Internal Medicine

## 2022-01-13 ENCOUNTER — Encounter: Payer: Self-pay | Admitting: Internal Medicine

## 2022-04-19 ENCOUNTER — Encounter: Payer: Self-pay | Admitting: Internal Medicine

## 2022-04-19 ENCOUNTER — Ambulatory Visit: Payer: Medicare HMO | Attending: Internal Medicine | Admitting: Internal Medicine

## 2022-04-19 VITALS — BP 154/86 | HR 86 | Ht 63.5 in | Wt 243.0 lb

## 2022-04-19 DIAGNOSIS — I10 Essential (primary) hypertension: Secondary | ICD-10-CM | POA: Diagnosis not present

## 2022-04-19 NOTE — Patient Instructions (Signed)
Medication Instructions:  Your physician recommends that you continue on your current medications as directed. Please refer to the Current Medication list given to you today.   Labwork: None today  Testing/Procedures: None today  Follow-Up: 1 year  Any Other Special Instructions Will Be Listed Below (If Applicable).  If you need a refill on your cardiac medications before your next appointment, please call your pharmacy.  
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
## 2022-06-14 ENCOUNTER — Other Ambulatory Visit: Payer: Self-pay | Admitting: Internal Medicine

## 2022-09-02 ENCOUNTER — Other Ambulatory Visit: Payer: Self-pay | Admitting: Internal Medicine

## 2022-09-09 ENCOUNTER — Ambulatory Visit: Payer: Medicare HMO | Admitting: Internal Medicine

## 2022-09-10 ENCOUNTER — Ambulatory Visit (INDEPENDENT_AMBULATORY_CARE_PROVIDER_SITE_OTHER): Payer: Medicare HMO | Admitting: Internal Medicine

## 2022-09-10 ENCOUNTER — Encounter: Payer: Self-pay | Admitting: Internal Medicine

## 2022-09-10 VITALS — BP 118/76 | HR 65 | Ht 63.5 in | Wt 211.8 lb

## 2022-09-10 DIAGNOSIS — E210 Primary hyperparathyroidism: Secondary | ICD-10-CM | POA: Diagnosis not present

## 2022-09-10 DIAGNOSIS — M810 Age-related osteoporosis without current pathological fracture: Secondary | ICD-10-CM

## 2022-09-10 DIAGNOSIS — E559 Vitamin D deficiency, unspecified: Secondary | ICD-10-CM | POA: Diagnosis not present

## 2022-09-10 DIAGNOSIS — R7989 Other specified abnormal findings of blood chemistry: Secondary | ICD-10-CM | POA: Diagnosis not present
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
## 2022-09-10 NOTE — Patient Instructions (Addendum)
Please continue vitamin D 10,000 units daily.  Try to get 1000-1200 mg calcium a day - preferentially from the diet.  Try to incorporate some weight bearing exercises.  Please come back for a follow-up appointment in 1 year.

## 2023-02-20 LAB — LAB REPORT - SCANNED
A1c: 6
EGFR: 97

## 2023-04-08 ENCOUNTER — Emergency Department (HOSPITAL_BASED_OUTPATIENT_CLINIC_OR_DEPARTMENT_OTHER): Payer: Medicare HMO

## 2023-04-08 ENCOUNTER — Other Ambulatory Visit: Payer: Self-pay

## 2023-04-08 ENCOUNTER — Emergency Department (HOSPITAL_BASED_OUTPATIENT_CLINIC_OR_DEPARTMENT_OTHER)
Admission: EM | Admit: 2023-04-08 | Discharge: 2023-04-08 | Disposition: A | Discharge status: Home / Self Care | Payer: Medicare HMO | Attending: Emergency Medicine | Admitting: Emergency Medicine

## 2023-04-08 ENCOUNTER — Encounter (HOSPITAL_BASED_OUTPATIENT_CLINIC_OR_DEPARTMENT_OTHER): Payer: Self-pay | Admitting: *Deleted

## 2023-04-08 DIAGNOSIS — R413 Other amnesia: Secondary | ICD-10-CM | POA: Diagnosis present

## 2023-04-08 DIAGNOSIS — Z1152 Encounter for screening for COVID-19: Secondary | ICD-10-CM | POA: Insufficient documentation

## 2023-04-08 DIAGNOSIS — R197 Diarrhea, unspecified: Secondary | ICD-10-CM | POA: Diagnosis not present

## 2023-04-08 DIAGNOSIS — R4182 Altered mental status, unspecified: Secondary | ICD-10-CM | POA: Insufficient documentation

## 2023-04-08 DIAGNOSIS — Z79899 Other long term (current) drug therapy: Secondary | ICD-10-CM | POA: Diagnosis not present

## 2023-04-08 DIAGNOSIS — Z85828 Personal history of other malignant neoplasm of skin: Secondary | ICD-10-CM | POA: Diagnosis not present

## 2023-04-08 DIAGNOSIS — R5383 Other fatigue: Secondary | ICD-10-CM | POA: Diagnosis not present

## 2023-04-08 DIAGNOSIS — I10 Essential (primary) hypertension: Secondary | ICD-10-CM | POA: Insufficient documentation

## 2023-04-08 DIAGNOSIS — R059 Cough, unspecified: Secondary | ICD-10-CM | POA: Insufficient documentation

## 2023-04-08 LAB — URINALYSIS, ROUTINE W REFLEX MICROSCOPIC
Bilirubin Urine: NEGATIVE
Glucose, UA: NEGATIVE mg/dL
Hgb urine dipstick: NEGATIVE
Ketones, ur: NEGATIVE mg/dL
Leukocytes,Ua: NEGATIVE
Nitrite: NEGATIVE
Protein, ur: NEGATIVE mg/dL
Specific Gravity, Urine: 1.011 (ref 1.005–1.030)
pH: 6 (ref 5.0–8.0)

## 2023-04-08 LAB — CBC WITH DIFFERENTIAL/PLATELET
Abs Immature Granulocytes: 0.01 10*3/uL (ref 0.00–0.07)
Basophils Absolute: 0 10*3/uL (ref 0.0–0.1)
Basophils Relative: 0 %
Eosinophils Absolute: 0 10*3/uL (ref 0.0–0.5)
Eosinophils Relative: 1 %
HCT: 35.6 % — ABNORMAL LOW (ref 36.0–46.0)
Hemoglobin: 11.9 g/dL — ABNORMAL LOW (ref 12.0–15.0)
Immature Granulocytes: 0 %
Lymphocytes Relative: 27 %
Lymphs Abs: 1.5 10*3/uL (ref 0.7–4.0)
MCH: 30.2 pg (ref 26.0–34.0)
MCHC: 33.4 g/dL (ref 30.0–36.0)
MCV: 90.4 fL (ref 80.0–100.0)
Monocytes Absolute: 0.3 10*3/uL (ref 0.1–1.0)
Monocytes Relative: 5 %
Neutro Abs: 3.6 10*3/uL (ref 1.7–7.7)
Neutrophils Relative %: 67 %
Platelets: 199 10*3/uL (ref 150–400)
RBC: 3.94 MIL/uL (ref 3.87–5.11)
RDW: 14.2 % (ref 11.5–15.5)
WBC: 5.4 10*3/uL (ref 4.0–10.5)
nRBC: 0 % (ref 0.0–0.2)

## 2023-04-08 LAB — BASIC METABOLIC PANEL
Anion gap: 9 (ref 5–15)
BUN: 12 mg/dL (ref 8–23)
CO2: 27 mmol/L (ref 22–32)
Calcium: 9.8 mg/dL (ref 8.9–10.3)
Chloride: 97 mmol/L — ABNORMAL LOW (ref 98–111)
Creatinine, Ser: 0.52 mg/dL (ref 0.44–1.00)
GFR, Estimated: >60 mL/min (ref >60)
Glucose, Bld: 111 mg/dL — ABNORMAL HIGH (ref 70–99)
Potassium: 3.9 mmol/L (ref 3.5–5.1)
Sodium: 133 mmol/L — ABNORMAL LOW (ref 135–145)

## 2023-04-08 LAB — RESP PANEL BY RT-PCR (RSV, FLU A&B, COVID)  RVPGX2
Influenza A by PCR: NEGATIVE
Influenza B by PCR: NEGATIVE
Resp Syncytial Virus by PCR: NEGATIVE
SARS Coronavirus 2 by RT PCR: NEGATIVE

## 2023-04-08 NOTE — ED Triage Notes (Signed)
Here by POV from home, dropped off form to allergist Dr. Carrolyn Meiers office, when they encouraged her to come to ED to rule out TIA. Pt endorses episode yesterday of fatigue, intermittent memory changes, 20 minute HA. Pt relates sx to an inhaler (generic symbicort) and possibly concurrent beta blocker. Denies sx at this time other than mental fatigue. Denies currently HA, syncope, fever, illness, dizziness, light headedness, CP, NV, sob, fever, or urinary sx. Denies fall/ injury. Now mentions diarrhea yesterday am. Describes inhaler use 1700 2d ago, then sx upon waking yesterday morning. Foggy memory yesterday afternoon.

## 2023-04-08 NOTE — ED Notes (Signed)
Patient transported to CT 

## 2023-04-08 NOTE — ED Notes (Signed)
Unable to give urine sample at this time. Given labeled specimen cup.

## 2023-04-08 NOTE — ED Notes (Signed)
Up dated patient on wait.  Patient unhook monitor and vital signs.   Refusing to placed back on monitor.  MD notified

## 2023-04-08 NOTE — ED Provider Notes (Signed)
[place] EMERGENCY DEPARTMENT AT [facility] Provider Note   CSN: [id number] Arrival date & time: 04/08/23  1214     History  Chief Complaint  Patient presents with   Fatigue    Alicia Schroeder is a 72 y.o. female.  Pt is a 72 yo female with pmhx significant for hld, arthritis, htn, skin cancer, and allergies.  Pt tried a new inhaler 2 days ago and could not remember the rest of the day.  Pt said she functioned, she just does not remember it.  She feels fine now.  She did have a little diarrhea yesterday, she thinks from some food she ate when she was oot this weekend.  Pt denies any weakness to any part of her body.  No vision changes.  No difficulty speaking/swallowing.  Pt is only here because her allergist recommended coming to the ED to be checked for a CVA.       Home Medications Prior to Admission medications   Medication Sig Start Date End Date Taking? Authorizing Provider  acebutolol (SECTRAL) 200 MG capsule TAKE 1 CAPSULE BY MOUTH TWICE A DAY 06/14/22   Marykay Lex, MD  ASHWAGANDHA PO Take by mouth.    [provider]  atorvastatin (LIPITOR) 20 MG tablet Take 20 mg by mouth daily.    [provider]  Calcium Carb-Cholecalciferol (CALCIUM 500/D) 500-400 MG-UNIT CHEW Chew by mouth.    [provider]  Cholecalciferol (VITAMIN D3) 10000 units TABS Take by mouth.    [provider]  co-enzyme Q-10 30 MG capsule Take 400 mg by mouth daily.     [provider]  Fexofenadine HCl (ALLEGRA ALLERGY PO) Take by mouth.    [provider]  Misc. Devices (NASADOCK) MISC by Does not apply route.    [provider]  Naproxen Sodium 220 MG CAPS Aleve 220 mg capsule    [provider]  Omega-3 Fatty Acids (FISH OIL) 1000 MG CAPS 1 capsule    [provider]  Turmeric 500 MG CAPS Take by mouth 2 (two) times daily.     [provider]  valsartan (DIOVAN) 80 MG tablet TAKE 1 TABLET BY  MOUTH TWICE A DAY 09/03/22   Pricilla Riffle, MD      Allergies    Ace inhibitors, Adhesive [tape], and Lidocaine    Review of Systems   Review of Systems  Neurological:        Memory loss  All other systems reviewed and are negative.   Physical Exam Updated Vital Signs BP (!) 171/75   Pulse (!) 59   Temp 98 F (36.7 C) (Oral)   Resp 16   Wt 83.9 kg   SpO2 99%   BMI 32.26 kg/m  Physical Exam Vitals and nursing note reviewed.  Constitutional:      Appearance: Normal appearance.  HENT:     Head: Normocephalic and atraumatic.     Right Ear: External ear normal.     Left Ear: External ear normal.     Nose: Nose normal.     Mouth/Throat:     Mouth: Mucous membranes are moist.     Pharynx: Oropharynx is clear.  Eyes:     Extraocular Movements: Extraocular movements intact.     Conjunctiva/sclera: Conjunctivae normal.     Pupils: Pupils are equal, round, and reactive to light.  Cardiovascular:     Rate and Rhythm: Normal rate.     Pulses: Normal pulses.  Heart sounds: Normal heart sounds.  Pulmonary:     Effort: Pulmonary effort is normal.     Breath sounds: Normal breath sounds.  Abdominal:     General: Abdomen is flat. Bowel sounds are normal.     Palpations: Abdomen is soft.  Musculoskeletal:        General: Normal range of motion.     Cervical back: Normal range of motion and neck supple.  Skin:    General: Skin is warm.     Capillary Refill: Capillary refill takes less than 2 seconds.  Neurological:     General: No focal deficit present.     Mental Status: She is alert and oriented to person, place, and time.  Psychiatric:        Mood and Affect: Mood normal.        Behavior: Behavior normal.        Thought Content: Thought content normal.        Judgment: Judgment normal.     ED Results / Procedures / Treatments   Labs (all labs ordered are listed, but only abnormal results are displayed) Labs Reviewed  BASIC METABOLIC PANEL - Abnormal; Notable  for the following components:      Result Value   Sodium 133 (*)    Chloride 97 (*)    Glucose, Bld 111 (*)    All other components within normal limits  CBC WITH DIFFERENTIAL/PLATELET - Abnormal; Notable for the following components:   Hemoglobin 11.9 (*)    HCT 35.6 (*)    All other components within normal limits  RESP PANEL BY RT-PCR (RSV, FLU A&B, COVID)  [id number]  URINALYSIS, ROUTINE W REFLEX MICROSCOPIC    EKG None  Radiology CT Head Wo Contrast  Result Date: 04/08/2023 CLINICAL DATA:  Mental status change, unknown cause EXAM: CT HEAD WITHOUT CONTRAST TECHNIQUE: Contiguous axial images were obtained from the base of the skull through the vertex without intravenous contrast. RADIATION DOSE REDUCTION: This exam was performed according to the departmental dose-optimization program which includes automated exposure control, adjustment of the mA and/or kV according to patient size and/or use of iterative reconstruction technique. COMPARISON:  None Available. FINDINGS: Brain: No evidence of acute infarction, hemorrhage, hydrocephalus, extra-axial collection or mass lesion/mass effect. Vascular: No hyperdense vessel or unexpected calcification. Skull: No acute fracture. Sinuses/Orbits: Clear sinuses.  No acute orbital findings. Other: No mastoid effusions. IMPRESSION: No evidence of acute intracranial abnormality. Electronically Signed   By: Feliberto Harts M.D.   On: 04/08/2023 18:34    Procedures Procedures    Medications Ordered in ED Medications - No data to display  ED Course/ Medical Decision Making/ A&P                                 Medical Decision Making Amount and/or Complexity of Data Reviewed Labs: ordered. Radiology: ordered.   This patient presents to the ED for concern of memory loss, this involves an extensive number of treatment options, and is a complaint that carries with it a high risk of complications and morbidity.  The differential diagnosis includes  med rxn, cva, infection, electrolyte abn   Co morbidities that complicate the patient evaluation   hld, arthritis, htn, skin cancer, and allergies   Additional history obtained:  Additional history obtained from epic chart review  Lab Tests:  I Ordered, and personally interpreted labs.  The pertinent results include:  cbc with hgb  11.9 (stable), bmp nl, ua nl   Imaging Studies ordered:  I ordered imaging studies including cxr, ct head  I independently visualized and interpreted imaging which showed  CT head: No evidence of acute intracranial abnormality.  CXR:  this has not yet been ready by the radiologist, but looks ok.  Pt no longer wants to wait. I agree with the radiologist interpretation   Cardiac Monitoring:  The patient was maintained on a cardiac monitor.  I personally viewed and interpreted the cardiac monitored which showed an underlying rhythm of: nsr   Medicines ordered and prescription drug management:   I have reviewed the patients home medicines and have made adjustments as needed   Test Considered:  ct   Problem List / ED Course:  Memory loss:  this is of unclear etiology.  Pt had no focal sx to suggest stroke.  She is very much ready to leave and will f/u with pcp regarding additional work up.  She knows to return if worse.    Reevaluation:  After the interventions noted above, I reevaluated the patient and found that they have :improved   Social Determinants of Health:  Lives at home   Dispostion:  After consideration of the diagnostic results and the patients response to treatment, I feel that the patent would benefit from discharge with outpatient f/u.          Final Clinical Impression(s) / ED Diagnoses Final diagnoses:  Temporary amnesia    Rx / DC Orders ED Discharge Orders     None         Jacalyn Lefevre, MD 04/08/23 1857
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
## 2023-04-21 ENCOUNTER — Encounter: Payer: Self-pay | Admitting: Internal Medicine

## 2023-04-21 ENCOUNTER — Ambulatory Visit: Payer: Medicare HMO | Attending: Internal Medicine | Admitting: Internal Medicine

## 2023-04-21 VITALS — BP 138/80 | HR 70 | Ht 64.0 in | Wt 192.6 lb

## 2023-04-21 DIAGNOSIS — I10 Essential (primary) hypertension: Secondary | ICD-10-CM | POA: Diagnosis not present

## 2023-04-21 NOTE — Patient Instructions (Signed)
Medication Instructions:  Your physician recommends that you continue on your current medications as directed. Please refer to the Current Medication list given to you today.  *If you need a refill on your cardiac medications before your next appointment, please call your pharmacy*   Lab Work: NONE   If you have labs (blood work) drawn today and your tests are completely normal, you will receive your results only by: MyChart Message (if you have MyChart) OR A paper copy in the mail If you have any lab test that is abnormal or we need to change your treatment, we will call you to review the results.   Testing/Procedures: NONE    Follow-Up: At [facility], you and your health needs are our priority.  As part of our continuing mission to provide you with exceptional heart care, we have created designated Provider Care Teams.  These Care Teams include your primary Cardiologist (physician) and Advanced Practice Providers (APPs -  Physician Assistants and Nurse Practitioners) who all work together to provide you with the care you need, when you need it.  We recommend signing up for the patient portal called "MyChart".  Sign up information is provided on this After Visit Summary.  MyChart is used to connect with patients for Virtual Visits (Telemedicine).  Patients are able to view lab/test results, encounter notes, upcoming appointments, etc.  Non-urgent messages can be sent to your provider as well.   To learn more about what you can do with MyChart, go to [email or web address].    Your next appointment:    November / December   Provider:   You may see Dietrich Pates, MD or one of the following Advanced Practice Providers on your designated Care Team:   Randall An, PA-C  Jacolyn Reedy, PA-C     Other Instructions Thank you for choosing [facility]!

## 2023-04-23 ENCOUNTER — Other Ambulatory Visit: Payer: Self-pay | Admitting: Medical Genetics

## 2023-05-28 ENCOUNTER — Other Ambulatory Visit (HOSPITAL_COMMUNITY): Payer: Self-pay | Attending: Medical Genetics

## 2023-06-10 ENCOUNTER — Telehealth: Payer: Self-pay | Admitting: Diagnostic Neuroimaging

## 2023-06-10 NOTE — Telephone Encounter (Signed)
Phone room: OBTAIN juror # and dates. Plz advise pt this is still being ran by the doctor and ultimately it would be his decision.   DR. Marjory Lies: Pt has never been seen by you therefore, I do not think you would be willing to write her out. I would think her appt would need to be scheduled for a different date because having only an appt to see you wouldn't constitute for a get out of jury duty note. Only if they had a condition or specific diagnosis meaning they couldn't perform juror tasks.  Correct me if I am wrong

## 2023-06-10 NOTE — Telephone Encounter (Signed)
Pt said was called to jury duty. Informed them I have a doctor appointment at your office. Need a confirmation faxed to [facility] that I have an doctor appointment at [facility] with Dr. Marjory Lies. Please fax to [phone], Attention to [facility]. Could you please make sure my name is on the confirmation. Thank you

## 2023-06-10 NOTE — Telephone Encounter (Signed)
PHONE ROOM: PLEASE LET PT KNOW HE WOULD LIKE PCP TO FILL THIS OUT SINCE HE HASN'T SEEN HER YET HE CANNOT FILL THIS OUT

## 2023-06-11 ENCOUNTER — Ambulatory Visit (INDEPENDENT_AMBULATORY_CARE_PROVIDER_SITE_OTHER): Payer: Medicare HMO | Admitting: Diagnostic Neuroimaging

## 2023-06-11 ENCOUNTER — Encounter: Payer: Self-pay | Admitting: Diagnostic Neuroimaging

## 2023-06-11 ENCOUNTER — Encounter: Payer: Self-pay | Admitting: *Deleted

## 2023-06-11 VITALS — BP 160/82 | Ht 64.0 in | Wt 185.4 lb

## 2023-06-11 DIAGNOSIS — G250 Essential tremor: Secondary | ICD-10-CM | POA: Diagnosis not present
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
## 2023-06-11 NOTE — Patient Instructions (Signed)
  Mild postural / action tremor (likely essential tremor; since ~2000) - monitor for now; consider propranolol or primidone in future

## 2023-06-14 ENCOUNTER — Other Ambulatory Visit: Payer: Self-pay | Admitting: Internal Medicine

## 2023-06-15 ENCOUNTER — Other Ambulatory Visit: Payer: Self-pay | Admitting: Cardiology

## 2023-09-09 ENCOUNTER — Encounter: Payer: Self-pay | Admitting: Internal Medicine

## 2023-09-09 ENCOUNTER — Ambulatory Visit (INDEPENDENT_AMBULATORY_CARE_PROVIDER_SITE_OTHER): Payer: Medicare HMO | Admitting: Internal Medicine

## 2023-09-09 VITALS — BP 122/70 | HR 89 | Ht 64.0 in | Wt 187.0 lb

## 2023-09-09 DIAGNOSIS — R7989 Other specified abnormal findings of blood chemistry: Secondary | ICD-10-CM | POA: Diagnosis not present

## 2023-09-09 DIAGNOSIS — M810 Age-related osteoporosis without current pathological fracture: Secondary | ICD-10-CM

## 2023-09-09 DIAGNOSIS — E559 Vitamin D deficiency, unspecified: Secondary | ICD-10-CM | POA: Diagnosis not present

## 2023-09-09 DIAGNOSIS — E210 Primary hyperparathyroidism: Secondary | ICD-10-CM | POA: Diagnosis not present
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
## 2023-09-09 NOTE — Patient Instructions (Addendum)
 Please continue vitamin D  10,000 units daily.  Try to get 1000-1200 mg calcium  a day - preferentially from the diet.  Try to incorporate some weight bearing exercises.  Please call and schedule the bone density scan at the [facility] Office: [phone].   Please come back for a follow-up appointment in 1 year.

## 2023-11-06 ENCOUNTER — Other Ambulatory Visit

## 2023-11-06 DIAGNOSIS — Z006 Encounter for examination for normal comparison and control in clinical research program: Secondary | ICD-10-CM

## 2023-11-14 LAB — GENECONNECT MOLECULAR SCREEN: Genetic Analysis Overall Interpretation: NEGATIVE

## 2023-12-04 ENCOUNTER — Other Ambulatory Visit: Payer: Self-pay | Admitting: Orthopaedic Surgery

## 2023-12-04 ENCOUNTER — Other Ambulatory Visit: Payer: Self-pay | Admitting: Family Medicine

## 2023-12-04 DIAGNOSIS — R2241 Localized swelling, mass and lump, right lower limb: Secondary | ICD-10-CM

## 2023-12-08 ENCOUNTER — Encounter: Payer: Self-pay | Admitting: Family Medicine

## 2023-12-11 ENCOUNTER — Encounter: Payer: Self-pay | Admitting: Family Medicine

## 2023-12-22 ENCOUNTER — Encounter: Payer: Self-pay | Admitting: Family Medicine

## 2023-12-25 ENCOUNTER — Ambulatory Visit (INDEPENDENT_AMBULATORY_CARE_PROVIDER_SITE_OTHER)
Admission: RE | Admit: 2023-12-25 | Discharge: 2023-12-25 | Disposition: A | Discharge status: Home / Self Care | Source: Ambulatory Visit | Attending: Internal Medicine | Admitting: Internal Medicine

## 2023-12-25 ENCOUNTER — Ambulatory Visit: Payer: Self-pay | Admitting: Internal Medicine

## 2023-12-25 ENCOUNTER — Ambulatory Visit
Admission: RE | Admit: 2023-12-25 | Discharge: 2023-12-25 | Disposition: A | Discharge status: Home / Self Care | Source: Ambulatory Visit | Attending: Family Medicine | Admitting: Family Medicine

## 2023-12-25 DIAGNOSIS — R2241 Localized swelling, mass and lump, right lower limb: Secondary | ICD-10-CM

## 2023-12-25 DIAGNOSIS — M810 Age-related osteoporosis without current pathological fracture: Secondary | ICD-10-CM | POA: Diagnosis not present

## 2023-12-26 ENCOUNTER — Other Ambulatory Visit: Payer: Self-pay | Admitting: Internal Medicine

## 2023-12-29 ENCOUNTER — Telehealth: Payer: Self-pay

## 2023-12-29 ENCOUNTER — Telehealth (HOSPITAL_COMMUNITY): Payer: Self-pay | Admitting: Pharmacy Technician

## 2023-12-29 NOTE — Telephone Encounter (Signed)
 error

## 2023-12-29 NOTE — Telephone Encounter (Addendum)
 Auth Submission: APPROVED Site of care: [facility] Payer: [employer] Medicare Medication & CPT/J Code(s) submitted: Prolia  (Denosumab ) [id number] Diagnosis Code:  Route of submission (phone, fax, portal): portal Phone # Fax # Auth type: Buy/Bill PB Units/visits requested: 60mg  x 2 doses, q 6 months Reference number: [id number] Approval Dates: 12/29/23 to 12/28/24     Dagoberto Armour, CPhT [facility] Phone: [phone] 01/01/2024

## 2024-01-07 ENCOUNTER — Ambulatory Visit

## 2024-01-07 VITALS — BP 154/70 | HR 61 | Temp 98.5°F | Resp 20 | Ht 64.0 in | Wt 190.4 lb

## 2024-01-07 DIAGNOSIS — M810 Age-related osteoporosis without current pathological fracture: Secondary | ICD-10-CM

## 2024-01-07 MED ORDER — DENOSUMAB 60 MG/ML ~~LOC~~ SOSY
60.0000 mg | PREFILLED_SYRINGE | Freq: Once | SUBCUTANEOUS | Status: AC
  Administered 2024-01-07: 60 mg via SUBCUTANEOUS
  Filled 2024-01-07: qty 1

## 2024-01-07 NOTE — Progress Notes (Signed)
 Diagnosis: Osteoporosis  Provider:  Chilton Greathouse MD  Procedure: Injection  Prolia (Denosumab), Dose: 60 mg, Site: subcutaneous, Number of injections: 1  Injection Site(s): Left arm  Post Care: Observation period completed  Discharge: Condition: Good, Destination: Home . AVS Declined  Performed by:  Rico Ala, LPN

## 2024-06-14 ENCOUNTER — Other Ambulatory Visit: Payer: Self-pay | Admitting: Internal Medicine

## 2024-06-14 ENCOUNTER — Other Ambulatory Visit: Payer: Self-pay | Admitting: Cardiology

## 2024-07-12 ENCOUNTER — Ambulatory Visit
